# Patient Record
Sex: Female | Born: 1935
Health system: Southern US, Community
[De-identification: ages and names within clinical notes are randomized; demographics above are authoritative.]

## PROBLEM LIST (undated history)

## (undated) DIAGNOSIS — C3490 Malignant neoplasm of unspecified part of unspecified bronchus or lung: Secondary | ICD-10-CM

## (undated) DIAGNOSIS — C259 Malignant neoplasm of pancreas, unspecified: Secondary | ICD-10-CM

## (undated) DIAGNOSIS — J449 Chronic obstructive pulmonary disease, unspecified: Secondary | ICD-10-CM

## (undated) DIAGNOSIS — J700 Acute pulmonary manifestations due to radiation: Secondary | ICD-10-CM

## (undated) HISTORY — PX: TOTAL HIP ARTHROPLASTY: SHX124

## (undated) HISTORY — PX: REPLACEMENT TOTAL KNEE: SUR1224

## (undated) HISTORY — PX: CHOLECYSTECTOMY: SHX55

## (undated) HISTORY — PX: APPENDECTOMY: SHX54

## (undated) HISTORY — PX: OTHER SURGICAL HISTORY: SHX169

---

## 2014-10-17 ENCOUNTER — Encounter (INDEPENDENT_AMBULATORY_CARE_PROVIDER_SITE_OTHER): Payer: 59 | Admitting: Ophthalmology

## 2014-10-17 DIAGNOSIS — H59033 Cystoid macular edema following cataract surgery, bilateral: Secondary | ICD-10-CM

## 2014-10-17 DIAGNOSIS — H3531 Nonexudative age-related macular degeneration: Secondary | ICD-10-CM

## 2014-10-17 DIAGNOSIS — H43813 Vitreous degeneration, bilateral: Secondary | ICD-10-CM

## 2014-11-24 ENCOUNTER — Encounter (HOSPITAL_COMMUNITY): Payer: Self-pay | Admitting: Emergency Medicine

## 2014-11-24 ENCOUNTER — Emergency Department (HOSPITAL_COMMUNITY): Payer: Medicare (Managed Care)

## 2014-11-24 ENCOUNTER — Emergency Department (HOSPITAL_COMMUNITY)
Admission: EM | Admit: 2014-11-24 | Discharge: 2014-11-25 | Disposition: A | Discharge status: Home / Self Care | Payer: Medicare (Managed Care) | Attending: Emergency Medicine | Admitting: Emergency Medicine

## 2014-11-24 DIAGNOSIS — Z7952 Long term (current) use of systemic steroids: Secondary | ICD-10-CM | POA: Diagnosis not present

## 2014-11-24 DIAGNOSIS — R918 Other nonspecific abnormal finding of lung field: Secondary | ICD-10-CM

## 2014-11-24 DIAGNOSIS — R112 Nausea with vomiting, unspecified: Secondary | ICD-10-CM | POA: Diagnosis not present

## 2014-11-24 DIAGNOSIS — E860 Dehydration: Secondary | ICD-10-CM | POA: Diagnosis not present

## 2014-11-24 DIAGNOSIS — R55 Syncope and collapse: Secondary | ICD-10-CM

## 2014-11-24 DIAGNOSIS — R0602 Shortness of breath: Secondary | ICD-10-CM | POA: Insufficient documentation

## 2014-11-24 DIAGNOSIS — Z79899 Other long term (current) drug therapy: Secondary | ICD-10-CM | POA: Insufficient documentation

## 2014-11-24 LAB — BASIC METABOLIC PANEL
ANION GAP: 8 (ref 5–15)
BUN: 8 mg/dL (ref 6–23)
CO2: 23 mmol/L (ref 19–32)
CREATININE: 0.61 mg/dL (ref 0.50–1.10)
Calcium: 9.6 mg/dL (ref 8.4–10.5)
Chloride: 101 mmol/L (ref 96–112)
GFR calc Af Amer: >90 mL/min (ref >90)
GFR, EST NON AFRICAN AMERICAN: 80 mL/min — AB (ref >90)
Glucose, Bld: 110 mg/dL — ABNORMAL HIGH (ref 70–99)
Potassium: 3.7 mmol/L (ref 3.5–5.1)
SODIUM: 132 mmol/L — AB (ref 135–145)

## 2014-11-24 LAB — BRAIN NATRIURETIC PEPTIDE: B Natriuretic Peptide: 78.6 pg/mL (ref 0.0–100.0)

## 2014-11-24 LAB — CBC WITH DIFFERENTIAL/PLATELET
BASOS PCT: 1 % (ref 0–1)
Basophils Absolute: 0 10*3/uL (ref 0.0–0.1)
EOS ABS: 0 10*3/uL (ref 0.0–0.7)
Eosinophils Relative: 1 % (ref 0–5)
HEMATOCRIT: 39.6 % (ref 36.0–46.0)
HEMOGLOBIN: 13.3 g/dL (ref 12.0–15.0)
Lymphocytes Relative: 25 % (ref 12–46)
Lymphs Abs: 1.5 10*3/uL (ref 0.7–4.0)
MCH: 28.4 pg (ref 26.0–34.0)
MCHC: 33.6 g/dL (ref 30.0–36.0)
MCV: 84.4 fL (ref 78.0–100.0)
MONOS PCT: 8 % (ref 3–12)
Monocytes Absolute: 0.5 10*3/uL (ref 0.1–1.0)
NEUTROS ABS: 4.1 10*3/uL (ref 1.7–7.7)
Neutrophils Relative %: 67 % (ref 43–77)
Platelets: 177 10*3/uL (ref 150–400)
RBC: 4.69 MIL/uL (ref 3.87–5.11)
RDW: 14.9 % (ref 11.5–15.5)
WBC: 6.1 10*3/uL (ref 4.0–10.5)

## 2014-11-24 LAB — I-STAT TROPONIN, ED: Troponin i, poc: 0.01 ng/mL (ref 0.00–0.08)

## 2014-11-24 MED ORDER — SODIUM CHLORIDE 0.9 % IV BOLUS (SEPSIS)
1000.0000 mL | Freq: Once | INTRAVENOUS | Status: AC
  Administered 2014-11-24: 1000 mL via INTRAVENOUS

## 2014-11-24 NOTE — ED Provider Notes (Signed)
CSN: [id number]     Arrival date & time 11/24/14  2027 History   First MD Initiated Contact with Patient 11/24/14 2048     Chief Complaint  Patient presents with  . Emesis  . Loss of Consciousness     (Consider location/radiation/quality/duration/timing/severity/associated sxs/prior Treatment) Patient is a 81 y.o. female presenting with syncope. The history is provided by the patient.  Loss of Consciousness Episode history:  Single Most recent episode:  Today Duration:  5 minutes Timing:  Constant Progression:  Resolved Chronicity:  New Witnessed: yes   Relieved by:  None tried Worsened by:  Nothing tried Ineffective treatments:  None tried Associated symptoms: nausea, shortness of breath and vomiting   Associated symptoms: no confusion, no difficulty breathing, no fever and no headaches     History reviewed. No pertinent past medical history. Past Surgical History  Procedure Laterality Date  . Cataracts      surgery   History reviewed. No pertinent family history. History  Substance Use Topics  . Smoking status: Never Smoker   . Smokeless tobacco: Not on file  . Alcohol Use: Not on file   OB History    No data available     Review of Systems  Constitutional: Negative for fever.  Respiratory: Positive for shortness of breath.   Cardiovascular: Positive for syncope.  Gastrointestinal: Positive for nausea and vomiting.  Neurological: Negative for headaches.  Psychiatric/Behavioral: Negative for confusion.  All other systems reviewed and are negative.     Allergies  Review of patient's allergies indicates no known allergies.  Home Medications   Prior to Admission medications   Medication Sig Start Date End Date Taking? Authorizing Provider  Multiple Vitamin (MULTIVITAMIN WITH MINERALS) TABS tablet Take 1 tablet by mouth daily.   Yes Historical Provider, MD  omeprazole (PRILOSEC OTC) 20 MG tablet Take 20 mg by mouth daily.   Yes Historical Provider, MD   prednisoLONE acetate (PRED FORTE) 1 % ophthalmic suspension Place 1 drop into both eyes 4 (four) times daily. 10/17/14  Yes Historical Provider, MD  VITAMIN E PO Take 1 tablet by mouth daily.   Yes Historical Provider, MD   BP 144/63 mmHg  Pulse 59  Temp(Src) 97.9 F (36.6 C) (Oral)  Resp 11  Ht 5\' 4"  (1.626 m)  Wt 190 lb (86.183 kg)  BMI 32.60 kg/m2  SpO2 94% Physical Exam  Constitutional: She is oriented to person, place, and time. She appears well-developed and well-nourished. No distress.  Very well-appearing, 76 elderly female in no distress  HENT:  Head: Normocephalic and atraumatic.  Mouth/Throat: No oropharyngeal exudate.  Mucous membranes dry  Eyes: EOM are normal. Pupils are equal, round, and reactive to light.  Neck: Normal range of motion. Neck supple.  Cardiovascular: Normal rate and regular rhythm.  Exam reveals no gallop and no friction rub.   No murmur heard. Pulmonary/Chest: Effort normal and breath sounds normal. No respiratory distress. She has no wheezes. She has no rales.  Abdominal: Soft. She exhibits no distension. There is no tenderness.  Musculoskeletal: Normal range of motion. She exhibits no edema or tenderness.  Lymphadenopathy:    She has no cervical adenopathy.  Neurological: She is alert and oriented to person, place, and time. She has normal strength. No cranial nerve deficit or sensory deficit. Coordination and gait normal.  Skin: Skin is warm and dry. No rash noted. She is not diaphoretic.  Psychiatric: She has a normal mood and affect. Her behavior is normal. Judgment and thought  content normal.  Nursing note and vitals reviewed.   ED Course  Procedures (including critical care time) Labs Review Labs Reviewed  BASIC METABOLIC PANEL - Abnormal; Notable for the following:    Sodium 132 (*)    Glucose, Bld 110 (*)    GFR calc non Af Amer 80 (*)    All other components within normal limits  CBC WITH DIFFERENTIAL/PLATELET  BRAIN  NATRIURETIC PEPTIDE  I-STAT TROPOININ, ED    Imaging Review Dg Chest 2 View  11/25/2014   CLINICAL DATA:  Altered mental status.  Vomiting.  EXAM: CHEST  2 VIEW  COMPARISON:  None.  FINDINGS: There is a 3.5 cm mass or masslike consolidation in the left lung centrally. The right lung is clear. Generalized interstitial fluid or thickening is present bilaterally. There is a hiatal hernia. No large effusions.  IMPRESSION: *Left lung mass or masslike consolidation. *Hiatal hernia *Recommend chest CT for probable neoplasm   Electronically Signed   By: Andreas Newport M.D.   On: 11/25/2014 00:02   Ct Head Wo Contrast  11/25/2014   CLINICAL DATA:  Syncopal episode well seated.  Tired.  EXAM: CT HEAD WITHOUT CONTRAST  TECHNIQUE: Contiguous axial images were obtained from the base of the skull through the vertex without intravenous contrast.  COMPARISON:  None.  FINDINGS: The ventricles and sulci are normal for age. No intraparenchymal hemorrhage, mass effect nor midline shift. Patchy supratentorial white matter hypodensities are within normal range for patient's age and though non-specific suggest sequelae of chronic small vessel ischemic disease. No acute large vascular territory infarcts.  No abnormal extra-axial fluid collections. Basal cisterns are patent. Mild calcific atherosclerosis of the carotid siphons.  No skull fracture. The included ocular globes and orbital contents are non-suspicious. Bilateral ocular lens implants. The mastoid aircells and included paranasal sinuses are well-aerated. Patient is edentulous. Calcified pannus at the level of C1-2 can be seen with CPPD, partially imaged.  IMPRESSION: No acute intracranial process ; normal noncontrast CT of the head for age.   Electronically Signed   By: Elon Alas   On: 11/25/2014 00:07     EKG Interpretation None      MDM   Final diagnoses:  Syncope and collapse  Lung mass   Remarkably healthy 81 year old female with no medical  problems presents with syncopal episode. Single episode from sitting with mild shaking of the right hand after this happened. She then had a period after the episode where she was less responsive and then vomited. Back to baseline within 5-10 minutes. Shaking episode does not seem consistent with seizure, especially given no previous history. This could be myoclonic jerks related to syncope. Unclear etiology of syncope but it is worrisome that she has no preceding symptoms. Differential includes dehydration as well as hypoglycemia as her only intake today was a banana. She did have some mild nausea and she does appear to be slightly dehydrated on my exam. More concerning is the possibility of cardiac etiology given syncope without preceding events. CT head as well as chest x-ray and laboratory workup will be performed. EKG is normal sinus rhythm without ischemic changes or arrhythmia, interval changes, delta waves. No Brugada. I have discussed need for echocardiogram and possible observation and the patient would refuse that can prefer to have outpatient cardiology follow-up if workup is found to be negative.  12:30 AM laboratory workup reassuring. CT head with no acute intracranial abnormality. I discussed options including observation for echocardiogram and cardiac monitoring and the  patient refuses. We also discussed her lung nodule noted on the left lung. Recommended close outpatient follow-up CT chest for further evaluation. She does have a significant smoking history which is concerning for possible malignancy. Additionally, she will follow up with Dr. Criss Rosales on Monday for an outpatient echocardiogram for syncope. Referral to cardiology also provided.  Larence Penning, MD 11/25/14 0045  Elnora Morrison, MD 11/25/14 [phone]

## 2014-11-24 NOTE — ED Notes (Signed)
Patient transported to X-ray & CT °

## 2014-11-24 NOTE — ED Notes (Addendum)
Per EMS- pt was at home sitting in chair, passed out and family reported convulsion like activity. Pt woke up and had one episode of vomiting. Given 4 mg zofran IV. Pt still reports nausea. No medical history besides cataract surgery. Pt states she normally eats but today only had a banana, states she is hungry though.

## 2014-11-25 NOTE — Discharge Instructions (Signed)
Syncope °Syncope is a medical term for fainting or passing out. This means you lose consciousness and drop to the ground. People are generally unconscious for less than 5 minutes. You may have some muscle twitches for up to 15 seconds before waking up and returning to normal. Syncope occurs more often in older adults, but it can happen to anyone. While most causes of syncope are not dangerous, syncope can be a sign of a serious medical problem. It is important to seek medical care.  °CAUSES  °Syncope is caused by a sudden drop in blood flow to the brain. The specific cause is often not determined. Factors that can bring on syncope include: °· Taking medicines that lower blood pressure. °· Sudden changes in posture, such as standing up quickly. °· Taking more medicine than prescribed. °· Standing in one place for too long. °· Seizure disorders. °· Dehydration and excessive exposure to heat. °· Low blood sugar (hypoglycemia). °· Straining to have a bowel movement. °· Heart disease, irregular heartbeat, or other circulatory problems. °· Fear, emotional distress, seeing blood, or severe pain. °SYMPTOMS  °Right before fainting, you may: °· Feel dizzy or light-headed. °· Feel nauseous. °· See all white or all black in your field of vision. °· Have cold, clammy skin. °DIAGNOSIS  °Your health care provider will ask about your symptoms, perform a physical exam, and perform an electrocardiogram (ECG) to record the electrical activity of your heart. Your health care provider may also perform other heart or blood tests to determine the cause of your syncope which may include: °· Transthoracic echocardiogram (TTE). During echocardiography, sound waves are used to evaluate how blood flows through your heart. °· Transesophageal echocardiogram (TEE). °· Cardiac monitoring. This allows your health care provider to monitor your heart rate and rhythm in real time. °· Holter monitor. This is a portable device that records your  heartbeat and can help diagnose heart arrhythmias. It allows your health care provider to track your heart activity for several days, if needed. °· Stress tests by exercise or by giving medicine that makes the heart beat faster. °TREATMENT  °In most cases, no treatment is needed. Depending on the cause of your syncope, your health care provider may recommend changing or stopping some of your medicines. °HOME CARE INSTRUCTIONS °· Have someone stay with you until you feel stable. °· Do not drive, use machinery, or play sports until your health care provider says it is okay. °· Keep all follow-up appointments as directed by your health care provider. °· Lie down right away if you start feeling like you might faint. Breathe deeply and steadily. Wait until all the symptoms have passed. °· Drink enough fluids to keep your urine clear or pale yellow. °· If you are taking blood pressure or heart medicine, get up slowly and take several minutes to sit and then stand. This can reduce dizziness. °SEEK IMMEDIATE MEDICAL CARE IF:  °· You have a severe headache. °· You have unusual pain in the chest, abdomen, or back. °· You are bleeding from your mouth or rectum, or you have black or tarry stool. °· You have an irregular or very fast heartbeat. °· You have pain with breathing. °· You have repeated fainting or seizure-like jerking during an episode. °· You faint when sitting or lying down. °· You have confusion. °· You have trouble walking. °· You have severe weakness. °· You have vision problems. °If you fainted, call your local emergency services (911 in [place] Do not drive   yourself to the hospital.  °MAKE SURE YOU: °· Understand these instructions. °· Will watch your condition. °· Will get help right away if you are not doing well or get worse. °Document Released: 08/10/2005 Document Revised: 08/15/2013 Document Reviewed: 10/09/2011 °Exit[employer]® Patient Information ©2015 [employer]. This information is not intended to replace  advice given to you by your health care provider. Make sure you discuss any questions you have with your health care provider. ° °

## 2014-11-28 ENCOUNTER — Other Ambulatory Visit: Payer: Self-pay | Admitting: Family Medicine

## 2014-11-28 ENCOUNTER — Ambulatory Visit (INDEPENDENT_AMBULATORY_CARE_PROVIDER_SITE_OTHER): Payer: 59 | Admitting: Ophthalmology

## 2014-11-28 DIAGNOSIS — H43813 Vitreous degeneration, bilateral: Secondary | ICD-10-CM | POA: Diagnosis not present

## 2014-11-28 DIAGNOSIS — H59033 Cystoid macular edema following cataract surgery, bilateral: Secondary | ICD-10-CM

## 2014-11-28 DIAGNOSIS — R918 Other nonspecific abnormal finding of lung field: Secondary | ICD-10-CM

## 2014-11-28 DIAGNOSIS — H3531 Nonexudative age-related macular degeneration: Secondary | ICD-10-CM | POA: Diagnosis not present

## 2014-11-30 ENCOUNTER — Ambulatory Visit
Admission: RE | Admit: 2014-11-30 | Discharge: 2014-11-30 | Disposition: A | Discharge status: Home / Self Care | Payer: 59 | Source: Ambulatory Visit | Attending: Family Medicine | Admitting: Family Medicine

## 2014-11-30 DIAGNOSIS — R918 Other nonspecific abnormal finding of lung field: Secondary | ICD-10-CM

## 2014-11-30 MED ORDER — IOPAMIDOL (ISOVUE-300) INJECTION 61%
75.0000 mL | Freq: Once | INTRAVENOUS | Status: AC | PRN
  Administered 2014-11-30: 75 mL via INTRAVENOUS

## 2014-12-03 ENCOUNTER — Telehealth: Payer: Self-pay | Admitting: Internal Medicine

## 2014-12-03 NOTE — Telephone Encounter (Signed)
Spoke with patient's son and advised to have the lung biopsy and reschedule the appointment with Dr Lovena Le.  He thought she was having an echo here and not an appointment.  I let him know I would call the PCP where she was referred and they will have to rescheduled appointment with lung MD or refer to another MD as we have never seen her I can not refer to another place.  He verbalized understanding and is going to keep the appointment as scheduled for now

## 2014-12-03 NOTE — Telephone Encounter (Signed)
New Message       Pt's son calling wanting to know if we have anyone in our office that could do a lung biopsy. Pt just found out from her PCP that she has finally been scheduled for her lung biopsy but it is on the same day as the pt's appt w/ Dr. Lovena Le. Pt's son was advised that I don't believe we do that procedure but he would like a call back from Dr. Tanna Furry nurse in regards to his question. Please call back and advise.

## 2014-12-05 ENCOUNTER — Encounter: Payer: Self-pay | Admitting: Emergency Medicine

## 2014-12-06 ENCOUNTER — Encounter: Payer: Self-pay | Admitting: Emergency Medicine

## 2014-12-06 ENCOUNTER — Ambulatory Visit (INDEPENDENT_AMBULATORY_CARE_PROVIDER_SITE_OTHER): Payer: 59 | Admitting: Emergency Medicine

## 2014-12-06 VITALS — BP 116/84 | HR 65 | Ht 61.0 in | Wt 185.0 lb

## 2014-12-06 DIAGNOSIS — R918 Other nonspecific abnormal finding of lung field: Secondary | ICD-10-CM

## 2014-12-06 DIAGNOSIS — R0609 Other forms of dyspnea: Secondary | ICD-10-CM

## 2014-12-06 DIAGNOSIS — J449 Chronic obstructive pulmonary disease, unspecified: Secondary | ICD-10-CM | POA: Insufficient documentation

## 2014-12-06 DIAGNOSIS — J984 Other disorders of lung: Secondary | ICD-10-CM

## 2014-12-06 NOTE — Assessment & Plan Note (Signed)
The size and appearance are consistent with a primary lung cancer. I think it would be reasonable to pursue biopsy in this fairly healthy patient despite her age. I think in order to ensure the highest yield and safest biopsy we should perform a PET scan to see if there is any distant disease. If there is no distant disease then we need to consider either bronchoscopy under conscious sedation or ENB under general anesthesia. Transthoracic needle biopsy is also an option but they would have to pass through some emphysematous lung reach the lesion. We will need to perform a cardiology evaluation given her recent syncope to ensure that she can tolerate the procedure and sedation.  Her insurance may dictate that this referral is out of network. We will need to sort out an exception or ensure that she can get care at [facility]

## 2014-12-06 NOTE — Patient Instructions (Addendum)
There is a lesion in your left lung that looks suspicious for a possible early lung cancer Given your good functional status and only few medical problems I believe that is reasonable to attempt to get a biopsy of this lesion to help guide you decisions about future treatment We will perform a PET scan to better evaluate the nodule Before we plan to do a biopsy I would like for you to have a cardiology evaluation to ensure that sedation would be safe and to evaluate your fainting spell. We will try to arrange this as soon as possible We will start a new medication called Spiriva. Please take 2 puffs once a day Follow with Dr Lamonte Sakai in 1 month

## 2014-12-06 NOTE — Progress Notes (Signed)
Subjective:    [name], female    DOB: Nov 23, 1926, 81 y.o.   MRN: [id number]  HPI 81 yo former smoker (about 50-60 pk-yrs), otherwise very little past medical history. She had an episode of syncope (the 2nd time in 10 yrs) that sent her to the ED. Part of the eval included a CT chest, showed . She feels well. Has not lost wt, has no cough.   Her insurance may dictate that this referral is out of network. We will need to sort out an exception or ensure that she can get care at [place] scan chest 11/30/14 -- 3.0 x 3.2 cm mass, compression fractures.    Review of Systems  Constitutional: Negative for fever and unexpected weight change.  HENT: Negative for congestion, dental problem, ear pain, nosebleeds, postnasal drip, rhinorrhea, sinus pressure, sneezing, sore throat and trouble swallowing.   Eyes: Negative for redness and itching.  Respiratory: Positive for cough and shortness of breath. Negative for chest tightness and wheezing.   Cardiovascular: Negative for palpitations and leg swelling.  Gastrointestinal: Negative for nausea and vomiting.  Genitourinary: Negative for dysuria.  Musculoskeletal: Negative for joint swelling.  Skin: Negative for rash.  Neurological: Negative for headaches.  Hematological: Does not bruise/bleed easily.  Psychiatric/Behavioral: Negative for dysphoric mood. The patient is not nervous/anxious.        Objective:   Physical Exam Filed Vitals:   12/06/14 1356 12/06/14 1357  BP:  116/84  Pulse:  65  Height: 5\' 1"  (1.549 m)   Weight: 185 lb (83.915 kg)   SpO2:  97%   Gen: Pleasant, elderly kyphotic, in no distress,  normal affect  ENT: No lesions,  mouth clear,  oropharynx clear, no postnasal drip  Neck: No JVD, no TMG, no carotid bruits  Lungs: No use of accessory muscles, clear without rales or rhonchi  Cardiovascular: RRR, heart sounds normal, no murmur or gallops, no peripheral edema  Musculoskeletal: No deformities,  no cyanosis or clubbing  Neuro: alert, non focal  Skin: Warm, no lesions or rashes    No past medical history on file.   Family History  Problem Relation Age of Onset  . Dementia Mother   . Hypertension Mother   . Deep vein thrombosis Father      History   Social History  . Marital Status: Widowed    Spouse Name: N/A  . Number of Children: N/A  . Years of Education: N/A   Occupational History  . Not on file.   Social History Main Topics  . Smoking status: Former Smoker    Types: Cigarettes    Quit date: 08/24/2012  . Smokeless tobacco: Not on file  . Alcohol Use: Yes  . Drug Use: No  . Sexual Activity: Not on file   Other Topics Concern  . Not on file   Social History Narrative     No Known Allergies   Outpatient Prescriptions Prior to Visit  Medication Sig Dispense Refill  . Multiple Vitamin (MULTIVITAMIN WITH MINERALS) TABS tablet Take 1 tablet by mouth daily.    . Multiple Vitamins-Minerals (OPTIVITE PO) Take 1 capsule by mouth daily.    Marland Kitchen omeprazole (PRILOSEC OTC) 20 MG tablet Take 20 mg by mouth daily.    . prednisoLONE acetate (PRED FORTE) 1 % ophthalmic suspension Place 2 drops into both eyes 4 (four) times daily.   12  . vitamin E 400 UNIT capsule Take 400 Units by mouth daily.  No facility-administered medications prior to visit.         Assessment & Plan:  Dyspnea on exertion She has emphysematous changes on her CT scan of the chest. With her tobacco history is suspect that she does have some degree of COPD to explain her progressive dyspnea. We will need to perform pulmonary function testing at some point in the future but for now I would like to start empiric Spiriva to see if she will benefit   Mass of lower lobe of left lung The size and appearance are consistent with a primary lung cancer. I think it would be reasonable to pursue biopsy in this fairly healthy patient despite her age. I think in order to ensure the highest yield and  safest biopsy we should perform a PET scan to see if there is any distant disease. If there is no distant disease then we need to consider either bronchoscopy under conscious sedation or ENB under general anesthesia. Transthoracic needle biopsy is also an option but they would have to pass through some emphysematous lung reach the lesion. We will need to perform a cardiology evaluation given her recent syncope to ensure that she can tolerate the procedure and sedation.  Her insurance may dictate that this referral is out of network. We will need to sort out an exception or ensure that she can get care at [facility]

## 2014-12-06 NOTE — Assessment & Plan Note (Signed)
She has emphysematous changes on her CT scan of the chest. With her tobacco history is suspect that she does have some degree of COPD to explain her progressive dyspnea. We will need to perform pulmonary function testing at some point in the future but for now I would like to start empiric Spiriva to see if she will benefit

## 2014-12-10 ENCOUNTER — Ambulatory Visit: Payer: Medicare (Managed Care) | Admitting: Internal Medicine

## 2015-01-07 ENCOUNTER — Ambulatory Visit: Payer: 59 | Admitting: Emergency Medicine

## 2015-01-09 ENCOUNTER — Encounter (INDEPENDENT_AMBULATORY_CARE_PROVIDER_SITE_OTHER): Payer: 59 | Admitting: Ophthalmology

## 2015-01-09 DIAGNOSIS — H59033 Cystoid macular edema following cataract surgery, bilateral: Secondary | ICD-10-CM

## 2015-01-09 DIAGNOSIS — H43813 Vitreous degeneration, bilateral: Secondary | ICD-10-CM

## 2015-01-09 DIAGNOSIS — H3531 Nonexudative age-related macular degeneration: Secondary | ICD-10-CM | POA: Diagnosis not present

## 2015-03-06 ENCOUNTER — Encounter (INDEPENDENT_AMBULATORY_CARE_PROVIDER_SITE_OTHER): Payer: 59 | Admitting: Ophthalmology

## 2015-03-06 DIAGNOSIS — H43813 Vitreous degeneration, bilateral: Secondary | ICD-10-CM

## 2015-03-06 DIAGNOSIS — H3531 Nonexudative age-related macular degeneration: Secondary | ICD-10-CM

## 2015-03-06 DIAGNOSIS — H59031 Cystoid macular edema following cataract surgery, right eye: Secondary | ICD-10-CM | POA: Diagnosis not present

## 2015-04-17 ENCOUNTER — Encounter (INDEPENDENT_AMBULATORY_CARE_PROVIDER_SITE_OTHER): Payer: 59 | Admitting: Ophthalmology

## 2015-04-22 ENCOUNTER — Encounter (INDEPENDENT_AMBULATORY_CARE_PROVIDER_SITE_OTHER): Payer: 59 | Admitting: Ophthalmology

## 2015-04-22 DIAGNOSIS — H43813 Vitreous degeneration, bilateral: Secondary | ICD-10-CM | POA: Diagnosis not present

## 2015-04-22 DIAGNOSIS — H59031 Cystoid macular edema following cataract surgery, right eye: Secondary | ICD-10-CM | POA: Diagnosis not present

## 2015-04-22 DIAGNOSIS — H3531 Nonexudative age-related macular degeneration: Secondary | ICD-10-CM

## 2015-06-24 ENCOUNTER — Encounter (INDEPENDENT_AMBULATORY_CARE_PROVIDER_SITE_OTHER): Payer: 59 | Admitting: Ophthalmology

## 2015-06-24 DIAGNOSIS — H59031 Cystoid macular edema following cataract surgery, right eye: Secondary | ICD-10-CM

## 2015-06-24 DIAGNOSIS — H353134 Nonexudative age-related macular degeneration, bilateral, advanced atrophic with subfoveal involvement: Secondary | ICD-10-CM | POA: Diagnosis not present

## 2015-06-24 DIAGNOSIS — H43813 Vitreous degeneration, bilateral: Secondary | ICD-10-CM

## 2015-07-01 ENCOUNTER — Encounter (INDEPENDENT_AMBULATORY_CARE_PROVIDER_SITE_OTHER): Payer: 59 | Admitting: Ophthalmology

## 2015-09-10 DIAGNOSIS — I779 Disorder of arteries and arterioles, unspecified: Secondary | ICD-10-CM | POA: Diagnosis not present

## 2015-09-10 DIAGNOSIS — R918 Other nonspecific abnormal finding of lung field: Secondary | ICD-10-CM | POA: Diagnosis not present

## 2015-09-10 DIAGNOSIS — M4854XA Collapsed vertebra, not elsewhere classified, thoracic region, initial encounter for fracture: Secondary | ICD-10-CM | POA: Diagnosis not present

## 2015-09-10 DIAGNOSIS — E559 Vitamin D deficiency, unspecified: Secondary | ICD-10-CM | POA: Diagnosis not present

## 2015-09-10 DIAGNOSIS — Z136 Encounter for screening for cardiovascular disorders: Secondary | ICD-10-CM | POA: Diagnosis not present

## 2015-09-10 DIAGNOSIS — G4700 Insomnia, unspecified: Secondary | ICD-10-CM | POA: Diagnosis not present

## 2015-09-10 DIAGNOSIS — J439 Emphysema, unspecified: Secondary | ICD-10-CM | POA: Diagnosis not present

## 2015-09-10 DIAGNOSIS — Z6835 Body mass index (BMI) 35.0-35.9, adult: Secondary | ICD-10-CM | POA: Diagnosis not present

## 2015-09-10 DIAGNOSIS — C3490 Malignant neoplasm of unspecified part of unspecified bronchus or lung: Secondary | ICD-10-CM | POA: Diagnosis not present

## 2015-09-26 ENCOUNTER — Telehealth: Payer: Self-pay | Admitting: *Deleted

## 2015-09-26 DIAGNOSIS — R918 Other nonspecific abnormal finding of lung field: Secondary | ICD-10-CM

## 2015-09-26 NOTE — Telephone Encounter (Signed)
Oncology Nurse Navigator Documentation  Oncology Nurse Navigator Flowsheets 09/26/2015  Navigator Location [facility]  Navigator Encounter Type Telephone;Introductory phone call/I received referral on Ms. Duggan last week.  I contacted referring physician to see if he was going to get tissue or would he like thoracic team to.  I did not hear from him.  I called patient today and scheduled her to be see at [facility] on 10/10/15 arrive at 12:30.  She verbalized understanding of appt time and place.   Treatment Phase Abnormal Scans  Barriers/Navigation Needs Coordination of Care  Interventions Coordination of Care  Coordination of Care Appts  Acuity Level 1  Time Spent with Patient 15

## 2015-10-07 ENCOUNTER — Telehealth: Payer: Self-pay | Admitting: *Deleted

## 2015-10-07 NOTE — Telephone Encounter (Signed)
Oncology Nurse Navigator Documentation  Oncology Nurse Navigator Flowsheets 10/07/2015  Treatment Phase Abnormal Scans/I noticed patient has been tx for lung cancer at [facility].  I called to clarify referral.  She stated she wants to be seen here at [facility].  I asked that she obtain images from [facility].  She stated she would.   Barriers/Navigation Needs Coordination of Care  Interventions Coordination of Care  Coordination of Care Appts  Acuity Level 1  Time Spent with Patient 15

## 2015-10-08 ENCOUNTER — Telehealth: Payer: Self-pay | Admitting: *Deleted

## 2015-10-08 DIAGNOSIS — Z961 Presence of intraocular lens: Secondary | ICD-10-CM | POA: Diagnosis not present

## 2015-10-08 DIAGNOSIS — H353131 Nonexudative age-related macular degeneration, bilateral, early dry stage: Secondary | ICD-10-CM | POA: Diagnosis not present

## 2015-10-08 DIAGNOSIS — H26493 Other secondary cataract, bilateral: Secondary | ICD-10-CM | POA: Diagnosis not present

## 2015-10-08 NOTE — Telephone Encounter (Signed)
Oncology Nurse Navigator Documentation  Oncology Nurse Navigator Flowsheets 10/08/2015  Navigator Location -  Navigator Encounter Type Telephone/I called Carol Cook to see if she was able to reach [facility] Radiology dept to request scans.  She stated she was unable.  I instructed her on next steps.  She verbalized understanding.  She stated she would call when she had the scans. I gave her my name and phone number to call.   Telephone Outgoing Call  Treatment Phase Abnormal Scans  Barriers/Navigation Needs Coordination of Care  Interventions Coordination of Care  Coordination of Care Appts  Acuity Level 1  Time Spent with Patient 15

## 2015-10-10 ENCOUNTER — Ambulatory Visit: Payer: 59 | Admitting: Internal Medicine

## 2015-10-10 ENCOUNTER — Telehealth: Payer: Self-pay | Admitting: *Deleted

## 2015-10-10 DIAGNOSIS — R918 Other nonspecific abnormal finding of lung field: Secondary | ICD-10-CM

## 2015-10-10 NOTE — Telephone Encounter (Signed)
Oncology Nurse Navigator Documentation  Oncology Nurse Navigator Flowsheets 10/10/2015  Navigator Location -  Navigator Encounter Type Telephone/Ms. Duross called me today.  She has received her CD's from [facility].  I called her back and gave her an appt to see Dr. Julien Nordmann on 10/23/15 arrive at 1:45.  She verbalized understanding of appt time and place  Telephone Outgoing Call  Treatment Phase Abnormal Scans  Barriers/Navigation Needs Coordination of Care  Interventions Coordination of Care  Coordination of Care Appts  Acuity Level 1  Time Spent with Patient 15

## 2015-10-23 ENCOUNTER — Ambulatory Visit (HOSPITAL_BASED_OUTPATIENT_CLINIC_OR_DEPARTMENT_OTHER): Payer: PPO | Admitting: Internal Medicine

## 2015-10-23 ENCOUNTER — Other Ambulatory Visit (HOSPITAL_BASED_OUTPATIENT_CLINIC_OR_DEPARTMENT_OTHER): Payer: PPO

## 2015-10-23 ENCOUNTER — Encounter: Payer: Self-pay | Admitting: Internal Medicine

## 2015-10-23 ENCOUNTER — Telehealth: Payer: Self-pay | Admitting: Internal Medicine

## 2015-10-23 VITALS — BP 132/81 | HR 84 | Temp 98.2°F | Resp 18 | Ht 61.0 in | Wt 190.9 lb

## 2015-10-23 DIAGNOSIS — R5383 Other fatigue: Secondary | ICD-10-CM

## 2015-10-23 DIAGNOSIS — C3432 Malignant neoplasm of lower lobe, left bronchus or lung: Secondary | ICD-10-CM

## 2015-10-23 DIAGNOSIS — R918 Other nonspecific abnormal finding of lung field: Secondary | ICD-10-CM

## 2015-10-23 DIAGNOSIS — C3492 Malignant neoplasm of unspecified part of left bronchus or lung: Secondary | ICD-10-CM | POA: Insufficient documentation

## 2015-10-23 LAB — CBC WITH DIFFERENTIAL/PLATELET
BASO%: 1.3 % (ref 0.0–2.0)
Basophils Absolute: 0.1 10*3/uL (ref 0.0–0.1)
EOS%: 3 % (ref 0.0–7.0)
Eosinophils Absolute: 0.2 10*3/uL (ref 0.0–0.5)
HCT: 39.8 % (ref 34.8–46.6)
HGB: 13.1 g/dL (ref 11.6–15.9)
LYMPH%: 14.9 % (ref 14.0–49.7)
MCH: 28.2 pg (ref 25.1–34.0)
MCHC: 32.8 g/dL (ref 31.5–36.0)
MCV: 86 fL (ref 79.5–101.0)
MONO#: 0.5 10*3/uL (ref 0.1–0.9)
MONO%: 9.1 % (ref 0.0–14.0)
NEUT#: 4 10*3/uL (ref 1.5–6.5)
NEUT%: 71.7 % (ref 38.4–76.8)
PLATELETS: 210 10*3/uL (ref 145–400)
RBC: 4.64 10*6/uL (ref 3.70–5.45)
RDW: 13.9 % (ref 11.2–14.5)
WBC: 5.6 10*3/uL (ref 3.9–10.3)
lymph#: 0.8 10*3/uL — ABNORMAL LOW (ref 0.9–3.3)

## 2015-10-23 LAB — COMPREHENSIVE METABOLIC PANEL
ALT: 10 U/L (ref 0–55)
AST: 22 U/L (ref 5–34)
Albumin: 3.6 g/dL (ref 3.5–5.0)
Alkaline Phosphatase: 83 U/L (ref 40–150)
Anion Gap: 10 mEq/L (ref 3–11)
BILIRUBIN TOTAL: 0.63 mg/dL (ref 0.20–1.20)
BUN: 11.9 mg/dL (ref 7.0–26.0)
CALCIUM: 10.3 mg/dL (ref 8.4–10.4)
CO2: 25 mEq/L (ref 22–29)
Chloride: 104 mEq/L (ref 98–109)
Creatinine: 0.8 mg/dL (ref 0.6–1.1)
EGFR: 68 mL/min/{1.73_m2} — ABNORMAL LOW (ref >90)
Glucose: 105 mg/dl (ref 70–140)
POTASSIUM: 4.4 meq/L (ref 3.5–5.1)
Sodium: 139 mEq/L (ref 136–145)
Total Protein: 7.2 g/dL (ref 6.4–8.3)

## 2015-10-23 NOTE — Progress Notes (Signed)
[place] [phone]   [phone]  CONSULT NOTE  REFERRING PHYSICIAN: Dr. Baltazar Apo  REASON FOR CONSULTATION:  82 years old white female diagnosed with lung cancer.  HPI Carol Cook is a 82 y.o. female with long history of smoking and past medical history significant only for cataract and macular degeneration. The patient was also recently diagnosed with non-small cell lung cancer. Her history for the diagnosis of lung cancer started on 11/24/2014 when the patient presented to the emergency Department complaining of syncopal episode. During her evaluation she had CT scan of the head on 11/25/2014 and it showed no acute intracranial process. Chest x-ray performed on the same day showed left lung mass. This was followed by CT scan of the chest with contrast on 11/30/2014 and it showed 3.2 x 3.0 cm lobulated mass is noted in the superior segment of the left lower lobe which is adjacent to the major fissure. This is highly concerning for malignancy. No mediastinal mass or adenopathy is noted. She was seen by Dr. Lamonte Sakai and he consider the patient for biopsy of the left lower lobe lung mass. She was out of the network for her insurance and her workup was performed at [facility]. She had a PET scan performed on 01/18/2015 and it showed hypermetabolic 3.0 cm left upper lobe lung mass with SUV of 5.6. There was no regional lymphadenopathy or distant metastasis. On 02/15/2015 the patient underwent CT-guided core biopsy of the left lower lobe lung mass by interventional radiology at [facility] and the final pathology was consistent with primary lung adenocarcinoma. The molecular studies showed negative EGFR mutation, ALK gene translocation and ROS 1.  On 05/01/2015 2 patient underwent stereotactic radiotherapy to the superior segment left lower lobe lung mass by radiation oncology at [facility] for a total dose of 50 gray in 5  fractions. Repeat CT scan of the chest on 06/10/2015 showed similar size of the left lower lobe lung mass with adjacent radiation changes. The patient has a lot of transportation issues going to [facility]. She requested her care to be transferred to [facility] and she is here today for evaluation and to establish care with me. When seen today she is feeling fine except for fatigue. She has some shortness breath with exertion that has been increasing over the last months. She also has cough episodes but no significant hemoptysis. She denied having any chest pain. She denied having any significant weight loss or night sweats. She has no nausea or vomiting. She has no headache or visual changes. Family history significant for mother died from on Zymar at age 69. Father died from pulmonary embolism at age 66. The patient is divorced and has 3 children. She was accompanied by her son Carol Cook today. She was to work as a Company secretary. She has a history of smoking less than one pack per day for around 64 years. She quit 3 years ago. She drinks alcohol daily. She has no history of drug abuse.  HPI  History reviewed. No pertinent past medical history.  Past Surgical History  Procedure Laterality Date  . Cataracts      surgery  . Appendectomy    . Cholecystectomy    . Replacement total knee Left   . Total hip arthroplasty Right     Family History  Problem Relation Age of Onset  . Dementia Mother   . Hypertension Mother   . Deep vein  thrombosis Father     Social History Social History  Substance Use Topics  . Smoking status: Former Smoker    Types: Cigarettes    Quit date: 08/24/2012  . Smokeless tobacco: None  . Alcohol Use: Yes    No Known Allergies  Current Outpatient Prescriptions  Medication Sig Dispense Refill  . cholecalciferol (VITAMIN D) 1000 units tablet Take 1,000 Units by mouth daily.    . Multiple Vitamin (MULTIVITAMIN WITH MINERALS) TABS tablet Take  1 tablet by mouth daily.    . Multiple Vitamins-Minerals (OPTIVITE PO) Take 1 capsule by mouth daily.    Marland Kitchen omeprazole (PRILOSEC OTC) 20 MG tablet Take 20 mg by mouth daily.    Marland Kitchen PROAIR HFA 108 (90 Base) MCG/ACT inhaler Inhale 1 puff into the lungs every 4 (four) hours as needed.  3  . traZODone (DESYREL) 50 MG tablet Take 50 mg by mouth daily.    . vitamin C (ASCORBIC ACID) 500 MG tablet Take 500 mg by mouth daily.    . vitamin E 400 UNIT capsule Take 400 Units by mouth daily.     No current facility-administered medications for this visit.    Review of Systems  Constitutional: positive for fatigue Eyes: negative Ears, nose, mouth, throat, and face: negative Respiratory: negative Cardiovascular: negative Gastrointestinal: negative Genitourinary:negative Integument/breast: negative Hematologic/lymphatic: negative Musculoskeletal:negative Neurological: negative Behavioral/Psych: negative Endocrine: negative Allergic/Immunologic: negative  Physical Exam  XHB:ZJIRC, healthy, no distress, well nourished and well developed SKIN: skin color, texture, turgor are normal, no rashes or significant lesions HEAD: Normocephalic, No masses, lesions, tenderness or abnormalities EYES: normal, PERRLA EARS: External ears normal, Canals clear OROPHARYNX:no exudate, no erythema and lips, buccal mucosa, and tongue normal  NECK: supple, no adenopathy, no JVD LYMPH:  no palpable lymphadenopathy, no hepatosplenomegaly BREAST:not examined LUNGS: clear to auscultation , and palpation HEART: regular rate & rhythm and no murmurs ABDOMEN:abdomen soft, non-tender, obese, normal bowel sounds and no masses or organomegaly BACK: Back symmetric, no curvature., No CVA tenderness EXTREMITIES:no joint deformities, effusion, or inflammation, no edema  NEURO: alert & oriented x 3 with fluent speech, no focal motor/sensory deficits  PERFORMANCE STATUS: ECOG 1-2  LABORATORY DATA: Lab Results  Component Value  Date   WBC 5.6 10/23/2015   HGB 13.1 10/23/2015   HCT 39.8 10/23/2015   MCV 86.0 10/23/2015   PLT 210 10/23/2015      Chemistry      Component Value Date/Time   NA 139 10/23/2015 1354   NA 132* 11/24/2014 2057   K 4.4 10/23/2015 1354   K 3.7 11/24/2014 2057   CL 101 11/24/2014 2057   CO2 25 10/23/2015 1354   CO2 23 11/24/2014 2057   BUN 11.9 10/23/2015 1354   BUN 8 11/24/2014 2057   CREATININE 0.8 10/23/2015 1354   CREATININE 0.61 11/24/2014 2057      Component Value Date/Time   CALCIUM 10.3 10/23/2015 1354   CALCIUM 9.6 11/24/2014 2057   ALKPHOS 83 10/23/2015 1354   AST 22 10/23/2015 1354   ALT 10 10/23/2015 1354   BILITOT 0.63 10/23/2015 1354       RADIOGRAPHIC STUDIES: No results found.  ASSESSMENT:This is a very pleasant 82 years old white female with history of stage IB (T2a, N0, M0) non-small cell lung cancer, adenocarcinoma with negative EGFR mutation, ALK gene translocation and ROS1 diagnosed in June 2016. She is status post stereotactic radiotherapy to the left lower lobe superior segment lung mass at [place] on 05/01/2015.  PLAN: I had a lengthy discussion with the patient and her son today about her current disease stage, prognosis and treatment options. Her last imaging studies were performed on 06/10/2015. The patient has increasing shortness of breath and cough recently. I recommended for her to have repeat CT scan of the chest in the next few days for restaging of her disease. If the scan showed no evidence for disease recurrence or progression, I would see her back for follow-up visit in 6 months with repeat CT scan of the chest. For the persistent shortness of breath, I recommended for the patient to reschedule another appointment was Dr. Lamonte Sakai for evaluation of her condition and management of her COPD. The patient and her son agreed to the current plan. The patient voices understanding of current disease status and treatment options and  is in agreement with the current care plan.  All questions were answered. The patient knows to call the clinic with any problems, questions or concerns. We can certainly see the patient much sooner if necessary.  Thank you so much for allowing me to participate in the care of [facility]. I will continue to follow up the patient with you and assist in her care.  I spent 40 minutes counseling the patient face to face. The total time spent in the appointment was 60 minutes.  Disclaimer: This note was dictated with voice recognition software. Similar sounding words can inadvertently be transcribed and may not be corrected upon review.   Jimmie Dattilio K. October 23, 2015, 3:02 PM

## 2015-10-23 NOTE — Telephone Encounter (Signed)
per pof to sch pt appt-gave pt copy of avs °

## 2015-10-25 ENCOUNTER — Inpatient Hospital Stay
Admission: RE | Admit: 2015-10-25 | Discharge: 2015-10-25 | Disposition: A | Discharge status: Home / Self Care | Payer: Self-pay | Source: Ambulatory Visit | Attending: Internal Medicine | Admitting: Internal Medicine

## 2015-10-25 ENCOUNTER — Other Ambulatory Visit: Payer: Self-pay | Admitting: Internal Medicine

## 2015-10-25 ENCOUNTER — Ambulatory Visit (HOSPITAL_COMMUNITY)
Admission: RE | Admit: 2015-10-25 | Discharge: 2015-10-25 | Disposition: A | Discharge status: Home / Self Care | Payer: PPO | Source: Ambulatory Visit | Attending: Internal Medicine | Admitting: Internal Medicine

## 2015-10-25 ENCOUNTER — Encounter (HOSPITAL_COMMUNITY): Payer: Self-pay

## 2015-10-25 DIAGNOSIS — K449 Diaphragmatic hernia without obstruction or gangrene: Secondary | ICD-10-CM | POA: Insufficient documentation

## 2015-10-25 DIAGNOSIS — I700 Atherosclerosis of aorta: Secondary | ICD-10-CM | POA: Insufficient documentation

## 2015-10-25 DIAGNOSIS — C3490 Malignant neoplasm of unspecified part of unspecified bronchus or lung: Secondary | ICD-10-CM | POA: Diagnosis not present

## 2015-10-25 DIAGNOSIS — C3492 Malignant neoplasm of unspecified part of left bronchus or lung: Secondary | ICD-10-CM

## 2015-10-25 DIAGNOSIS — J439 Emphysema, unspecified: Secondary | ICD-10-CM | POA: Diagnosis not present

## 2015-10-25 MED ORDER — IOHEXOL 300 MG/ML  SOLN
100.0000 mL | Freq: Once | INTRAMUSCULAR | Status: AC | PRN
  Administered 2015-10-25: 75 mL via INTRAVENOUS

## 2015-10-28 ENCOUNTER — Telehealth: Payer: Self-pay

## 2015-10-28 NOTE — Telephone Encounter (Signed)
Patient called per Dr. Julien Nordmann looking for CT scan results from Friday 10/25/15.  She is requesting a call back.

## 2015-10-28 NOTE — Telephone Encounter (Signed)
  Returned call to pt, unable to reach pt. Will review with MD and call pt with additional information.

## 2015-10-29 ENCOUNTER — Encounter: Payer: Self-pay | Admitting: Emergency Medicine

## 2015-10-29 ENCOUNTER — Ambulatory Visit (INDEPENDENT_AMBULATORY_CARE_PROVIDER_SITE_OTHER): Payer: PPO | Admitting: Emergency Medicine

## 2015-10-29 VITALS — BP 134/92 | HR 74 | Ht 61.0 in | Wt 189.0 lb

## 2015-10-29 DIAGNOSIS — R0600 Dyspnea, unspecified: Secondary | ICD-10-CM | POA: Diagnosis not present

## 2015-10-29 DIAGNOSIS — C3492 Malignant neoplasm of unspecified part of left bronchus or lung: Secondary | ICD-10-CM

## 2015-10-29 DIAGNOSIS — J449 Chronic obstructive pulmonary disease, unspecified: Secondary | ICD-10-CM

## 2015-10-29 DIAGNOSIS — J700 Acute pulmonary manifestations due to radiation: Secondary | ICD-10-CM | POA: Diagnosis not present

## 2015-10-29 MED ORDER — TIOTROPIUM BROMIDE-OLODATEROL 2.5-2.5 MCG/ACT IN AERS: 2.0000 | INHALATION_SPRAY | Freq: Every day | RESPIRATORY_TRACT | Status: DC

## 2015-10-29 NOTE — Assessment & Plan Note (Signed)
Now being followed by Dr. Julien Nordmann at the [facility]

## 2015-10-29 NOTE — Patient Instructions (Signed)
Please start Stiolto once a day. Rinse your mouth and gargle after using.  Let us know if you develop cough, dry mouth or any other problems from the medication.  Follow with Dr Lamonte Sakai next available with full pulmonary function testing.

## 2015-10-29 NOTE — Progress Notes (Signed)
Subjective:    [name], female    DOB: April 02, 1927, 82 y.o.   MRN: [id number]  HPI 82 yo former smoker (about 50-60 pk-yrs), otherwise very little past medical history. She had an episode of syncope (the 2nd time in 10 yrs) that sent her to the ED. Part of the eval included a CT chest, showed a mass in superior segment LLL. She feels well. Has not lost wt, has no cough.   Her insurance may dictate that this referral is out of network. We will need to sort out an exception or ensure that she can get care at [place] scan chest 11/30/14 -- 3.0 x 3.2 cm mass, compression fractures.   ROV 10/29/15 -- follow-up visit for this pleasant 82 year old woman with a history of tobacco use. I saw her first in April 2016 when she was noted to have a 3 cm mass in the superior segment of her left lower lobe. She was out of network and her subsequent evaluation happened at [facility]. She underwent a CT-guided needle biopsy that showed adenocarcinoma. She underwent stereotactic radiotherapy in 5 fractions. Her insurance is now changed and she will be able to get her care in [place]. She has met Dr. Julien Nordmann with thoracic oncology and will be followed here as well. A CT scan of her chest was performed on 10/25/15 that showed an interval decrease in the size of her left lower lobe mass without any evidence of recurrence. There is some adjacent interstitial disease that's probably related to evolving radiation change. She has noticed some increased exertional SOB, has evolved over about last 2 months.    Review of Systems  Constitutional: Negative for fever and unexpected weight change.  HENT: Negative for congestion, dental problem, ear pain, nosebleeds, postnasal drip, rhinorrhea, sinus pressure, sneezing, sore throat and trouble swallowing.   Eyes: Negative for redness and itching.  Respiratory: Positive for cough and shortness of breath. Negative for chest tightness and wheezing.     Cardiovascular: Negative for palpitations and leg swelling.  Gastrointestinal: Negative for nausea and vomiting.  Genitourinary: Negative for dysuria.  Musculoskeletal: Negative for joint swelling.  Skin: Negative for rash.  Neurological: Negative for headaches.  Hematological: Does not bruise/bleed easily.  Psychiatric/Behavioral: Negative for dysphoric mood. The patient is not nervous/anxious.        Objective:   Physical Exam Filed Vitals:   10/29/15 1552  BP: 134/92  Pulse: 74  Height: 5' 1"  (1.549 m)  Weight: 189 lb (85.73 kg)  SpO2: 94%   Gen: Pleasant, elderly kyphotic, in no distress,  normal affect  ENT: No lesions,  mouth clear,  oropharynx clear, no postnasal drip  Neck: No JVD, no TMG, no carotid bruits  Lungs: No use of accessory muscles, clear without rales or rhonchi  Cardiovascular: RRR, heart sounds normal, no murmur or gallops, no peripheral edema  Musculoskeletal: No deformities, no cyanosis or clubbing  Neuro: alert, non focal  Skin: Warm, no lesions or rashes    No past medical history on file.   Family History  Problem Relation Age of Onset  . Dementia Mother   . Hypertension Mother   . Deep vein thrombosis Father      Social History   Social History  . Marital Status: Widowed    Spouse Name: N/A  . Number of Children: N/A  . Years of Education: N/A   Occupational History  . Not on file.   Social History Main Topics  .  Smoking status: Former Smoker    Types: Cigarettes    Quit date: 08/24/2012  . Smokeless tobacco: Not on file  . Alcohol Use: Yes  . Drug Use: No  . Sexual Activity: Not on file   Other Topics Concern  . Not on file   Social History Narrative     No Known Allergies   Outpatient Prescriptions Prior to Visit  Medication Sig Dispense Refill  . cholecalciferol (VITAMIN D) 1000 units tablet Take 1,000 Units by mouth daily.    . Multiple Vitamin (MULTIVITAMIN WITH MINERALS) TABS tablet Take 1 tablet by  mouth daily.    . Multiple Vitamins-Minerals (OPTIVITE PO) Take 1 capsule by mouth daily.    Marland Kitchen omeprazole (PRILOSEC OTC) 20 MG tablet Take 20 mg by mouth daily.    Marland Kitchen PROAIR HFA 108 (90 Base) MCG/ACT inhaler Inhale 1 puff into the lungs every 4 (four) hours as needed.  3  . traZODone (DESYREL) 50 MG tablet Take 50 mg by mouth daily.    . vitamin C (ASCORBIC ACID) 500 MG tablet Take 500 mg by mouth daily.    . vitamin E 400 UNIT capsule Take 400 Units by mouth daily.     No facility-administered medications prior to visit.         Assessment & Plan:  COPD (chronic obstructive pulmonary disease) ([place]) Patient has emphysematous change on her CT scan of the chest. I do not have any primary function testing available at this time but I do believe she has clinically significant COPD. It may have only begun to manifest itself post radiation and with the evolution of some mild radiation scar. I like to treat her empirically with the bronchodilator to see if she benefits. Talked about the potential side effects including cough and dry mouth.  Non-small cell cancer of left lung [facility]) Now being followed by Dr. Julien Nordmann at the [facility]  Post-radiation pneumonitis [facility]) I do not see any evidence of active pneumonitis on CT scan of the chest, but there is some left-sided scarring that I presume is related to her XRT. There's no intervention to make at this time beyond following with serial imaging. I do believe that the evolving scar probably has caused her to reach a functional plateau. She is now having dyspnea. We will try to address her obstructive lung disease which is more modifiable than scarring

## 2015-10-29 NOTE — Telephone Encounter (Addendum)
Patient called reporting the "nurse called her yesterday at 4:00 pm but she wasn't home and believes she missed a call about CT results.  I'm going to see Dr. Lamonte Sakai this morning."  Shared with her the left lobe area is smaller with no new or progressive disease.

## 2015-10-29 NOTE — Assessment & Plan Note (Signed)
I do not see any evidence of active pneumonitis on CT scan of the chest, but there is some left-sided scarring that I presume is related to her XRT. There's no intervention to make at this time beyond following with serial imaging. I do believe that the evolving scar probably has caused her to reach a functional plateau. She is now having dyspnea. We will try to address her obstructive lung disease which is more modifiable than scarring

## 2015-10-29 NOTE — Assessment & Plan Note (Signed)
Patient has emphysematous change on her CT scan of the chest. I do not have any primary function testing available at this time but I do believe she has clinically significant COPD. It may have only begun to manifest itself post radiation and with the evolution of some mild radiation scar. I like to treat her empirically with the bronchodilator to see if she benefits. Talked about the potential side effects including cough and dry mouth.

## 2016-01-07 ENCOUNTER — Ambulatory Visit (INDEPENDENT_AMBULATORY_CARE_PROVIDER_SITE_OTHER): Payer: PPO | Admitting: Emergency Medicine

## 2016-01-07 ENCOUNTER — Encounter: Payer: Self-pay | Admitting: Emergency Medicine

## 2016-01-07 VITALS — BP 126/92 | HR 84 | Ht 61.0 in | Wt 182.0 lb

## 2016-01-07 DIAGNOSIS — R0600 Dyspnea, unspecified: Secondary | ICD-10-CM | POA: Diagnosis not present

## 2016-01-07 DIAGNOSIS — J449 Chronic obstructive pulmonary disease, unspecified: Secondary | ICD-10-CM | POA: Diagnosis not present

## 2016-01-07 DIAGNOSIS — C3492 Malignant neoplasm of unspecified part of left bronchus or lung: Secondary | ICD-10-CM | POA: Diagnosis not present

## 2016-01-07 LAB — PULMONARY FUNCTION TEST
DL/VA % PRED: 54 %
DL/VA: 2.4 ml/min/mmHg/L
DLCO cor % pred: 41 %
DLCO cor: 8.43 ml/min/mmHg
DLCO unc % pred: 45 %
DLCO unc: 9.14 ml/min/mmHg
FEF 25-75 Post: 1.22 L/sec
FEF 25-75 Pre: 1.01 L/sec
FEF2575-%Change-Post: 20 %
FEF2575-%PRED-PRE: 118 %
FEF2575-%Pred-Post: 142 %
FEV1-%Change-Post: 0 %
FEV1-%PRED-PRE: 138 %
FEV1-%Pred-Post: 138 %
FEV1-POST: 1.93 L
FEV1-PRE: 1.94 L
FEV1FVC-%Change-Post: 0 %
FEV1FVC-%Pred-Pre: 96 %
FEV6-%Change-Post: 0 %
FEV6-%PRED-PRE: 155 %
FEV6-%Pred-Post: 156 %
FEV6-POST: 2.79 L
FEV6-PRE: 2.77 L
FEV6FVC-%Change-Post: 0 %
FEV6FVC-%PRED-POST: 106 %
FEV6FVC-%PRED-PRE: 106 %
FVC-%CHANGE-POST: 0 %
FVC-%PRED-POST: 146 %
FVC-%PRED-PRE: 145 %
FVC-POST: 2.81 L
FVC-Pre: 2.8 L
POST FEV6/FVC RATIO: 99 %
PRE FEV1/FVC RATIO: 69 %
PRE FEV6/FVC RATIO: 99 %
Post FEV1/FVC ratio: 69 %
RV % PRED: 60 %
RV: 1.44 L
TLC % PRED: 88 %
TLC: 4.08 L

## 2016-01-07 NOTE — Assessment & Plan Note (Signed)
Follow-up CT scanning and visit with Dr. Julien Nordmann as planned

## 2016-01-07 NOTE — Assessment & Plan Note (Signed)
Multifactorial shortness of breath with likely contributions from postradiation scar, kyphosis, and mild obstructive disease that's been identified on pulmonary function testing. She also has a diffusion defect and I would like to assess her ambulatory oximetry.

## 2016-01-07 NOTE — Assessment & Plan Note (Signed)
Mild obstruction with significant hyperinflation and a diffusion defect noted on pulmonary function testing today. She is not sure that she has significantly benefited from [facility] so we will stop this medication to see if she misses it. If so then we will restart.

## 2016-01-07 NOTE — Progress Notes (Signed)
Subjective:    [name], female    DOB: 1927-02-17, 82 y.o.   MRN: [id number]  Shortness of Breath Pertinent negatives include no ear pain, fever, headaches, leg swelling, rash, rhinorrhea, sore throat, vomiting or wheezing.   82 yo former smoker (about 50-60 pk-yrs), otherwise very little past medical history. She had an episode of syncope (the 2nd time in 10 yrs) that sent her to the ED. Part of the eval included a CT chest, showed a mass in superior segment LLL. She feels well. Has not lost wt, has no cough.   Her insurance may dictate that this referral is out of network. We will need to sort out an exception or ensure that she can get care at [place] scan chest 11/30/14 -- 3.0 x 3.2 cm mass, compression fractures.   ROV 10/29/15 -- follow-up visit for this pleasant 82 year old woman with a history of tobacco use. I saw her first in April 2016 when she was noted to have a 3 cm mass in the superior segment of her left lower lobe. She was out of network and her subsequent evaluation happened at [facility]. She underwent a CT-guided needle biopsy that showed adenocarcinoma. She underwent stereotactic radiotherapy in 5 fractions. Her insurance is now changed and she will be able to get her care in [place]. She has met Dr. Julien Nordmann with thoracic oncology and will be followed here as well. A CT scan of her chest was performed on 10/25/15 that showed an interval decrease in the size of her left lower lobe mass without any evidence of recurrence. There is some adjacent interstitial disease that's probably related to evolving radiation change. She has noticed some increased exertional SOB, has evolved over about last 2 months.   ROV 01/07/16 -- Follow-up visit for patient with history of tobacco use, and a carcinoma the lung status post stereotactic radiation therapy. She has some radiation change and suspected scarring on her repeat CT scan of the chest. Not clear to me that she had  active radiation pneumonitis. Pulmonary function testing was done today and I personally reviewed the results. This shows significant hyperinflation with mild obstruction, normal total lung capacity with significantly decreased diffusion capacity. At her last visit we started Stiolto to see if she would benefit - she believes that it has helped her breathing some. The stiolto may cause her to cough, she also is having some swallowing dis coordination that can sometimes making her cough. She does not use SABA.   Review of Systems  Constitutional: Negative for fever and unexpected weight change.  HENT: Negative for congestion, dental problem, ear pain, nosebleeds, postnasal drip, rhinorrhea, sinus pressure, sneezing, sore throat and trouble swallowing.   Eyes: Negative for redness and itching.  Respiratory: Positive for cough and shortness of breath. Negative for chest tightness and wheezing.   Cardiovascular: Negative for palpitations and leg swelling.  Gastrointestinal: Negative for nausea and vomiting.  Genitourinary: Negative for dysuria.  Musculoskeletal: Negative for joint swelling.  Skin: Negative for rash.  Neurological: Negative for headaches.  Hematological: Does not bruise/bleed easily.  Psychiatric/Behavioral: Negative for dysphoric mood. The patient is not nervous/anxious.        Objective:   Physical Exam Filed Vitals:   01/07/16 1209  BP: 126/92  Pulse: 84  Height: 5' 1"  (1.549 m)  Weight: 182 lb (82.555 kg)  SpO2: 94%   Gen: Pleasant, elderly kyphotic, in no distress,  normal affect  ENT: No lesions,  mouth  clear,  oropharynx clear, no postnasal drip  Neck: No JVD, no TMG, no carotid bruits  Lungs: No use of accessory muscles, clear without rales or rhonchi  Cardiovascular: RRR, heart sounds normal, no murmur or gallops, no peripheral edema  Musculoskeletal: No deformities, no cyanosis or clubbing  Neuro: alert, non focal  Skin: Warm, no lesions or  rashes    No past medical history on file.   Family History  Problem Relation Age of Onset  . Dementia Mother   . Hypertension Mother   . Deep vein thrombosis Father      Social History   Social History  . Marital Status: Widowed    Spouse Name: N/A  . Number of Children: N/A  . Years of Education: N/A   Occupational History  . Not on file.   Social History Main Topics  . Smoking status: Former Smoker    Types: Cigarettes    Quit date: 08/24/2012  . Smokeless tobacco: Not on file  . Alcohol Use: Yes  . Drug Use: No  . Sexual Activity: Not on file   Other Topics Concern  . Not on file   Social History Narrative     No Known Allergies   Outpatient Prescriptions Prior to Visit  Medication Sig Dispense Refill  . cholecalciferol (VITAMIN D) 1000 units tablet Take 1,000 Units by mouth daily.    . Multiple Vitamin (MULTIVITAMIN WITH MINERALS) TABS tablet Take 1 tablet by mouth daily.    . Multiple Vitamins-Minerals (OPTIVITE PO) Take 1 capsule by mouth daily.    Marland Kitchen omeprazole (PRILOSEC OTC) 20 MG tablet Take 20 mg by mouth daily.    Marland Kitchen PROAIR HFA 108 (90 Base) MCG/ACT inhaler Inhale 1 puff into the lungs every 4 (four) hours as needed.  3  . Tiotropium Bromide-Olodaterol (STIOLTO RESPIMAT) 2.5-2.5 MCG/ACT AERS Inhale 2 puffs into the lungs daily. 1 Inhaler 5  . vitamin C (ASCORBIC ACID) 500 MG tablet Take 500 mg by mouth daily.    . vitamin E 400 UNIT capsule Take 400 Units by mouth daily.    . traZODone (DESYREL) 50 MG tablet Take 50 mg by mouth daily.     No facility-administered medications prior to visit.         Assessment & Plan:  Dyspnea Multifactorial shortness of breath with likely contributions from postradiation scar, kyphosis, and mild obstructive disease that's been identified on pulmonary function testing. She also has a diffusion defect and I would like to assess her ambulatory oximetry.   COPD (chronic obstructive pulmonary disease) ([facility]) Mild  obstruction with significant hyperinflation and a diffusion defect noted on pulmonary function testing today. She is not sure that she has significantly benefited from [facility] so we will stop this medication to see if she misses it. If so then we will restart.  Non-small cell cancer of left lung [facility]) Follow-up CT scanning and visit with Dr. Julien Nordmann as planned

## 2016-01-07 NOTE — Patient Instructions (Signed)
Your pulmonary function testing shows mild obstructive lung disease. We will try stopping Stiolto to see if he missed this medication. Keep your albuterol inhaler available to use 2 puffs if needed for sudden shortness of breath. Walking oximetry today Follow-up CT scan of the chest with Dr. Julien Nordmann as planned Follow with Dr Lamonte Sakai in 6 months or sooner if you have any problems

## 2016-01-07 NOTE — Progress Notes (Signed)
PFT done today. 

## 2016-01-23 DIAGNOSIS — J449 Chronic obstructive pulmonary disease, unspecified: Secondary | ICD-10-CM | POA: Diagnosis not present

## 2016-01-23 DIAGNOSIS — R600 Localized edema: Secondary | ICD-10-CM | POA: Diagnosis not present

## 2016-01-23 DIAGNOSIS — C3490 Malignant neoplasm of unspecified part of unspecified bronchus or lung: Secondary | ICD-10-CM | POA: Diagnosis not present

## 2016-01-23 DIAGNOSIS — F324 Major depressive disorder, single episode, in partial remission: Secondary | ICD-10-CM | POA: Diagnosis not present

## 2016-01-23 DIAGNOSIS — E559 Vitamin D deficiency, unspecified: Secondary | ICD-10-CM | POA: Diagnosis not present

## 2016-02-18 DIAGNOSIS — R600 Localized edema: Secondary | ICD-10-CM | POA: Diagnosis not present

## 2016-03-10 DIAGNOSIS — M549 Dorsalgia, unspecified: Secondary | ICD-10-CM | POA: Diagnosis not present

## 2016-03-10 DIAGNOSIS — Z79899 Other long term (current) drug therapy: Secondary | ICD-10-CM | POA: Diagnosis not present

## 2016-03-10 DIAGNOSIS — R600 Localized edema: Secondary | ICD-10-CM | POA: Diagnosis not present

## 2016-03-10 DIAGNOSIS — T148 Other injury of unspecified body region: Secondary | ICD-10-CM | POA: Diagnosis not present

## 2016-03-18 DIAGNOSIS — L821 Other seborrheic keratosis: Secondary | ICD-10-CM | POA: Diagnosis not present

## 2016-03-18 DIAGNOSIS — L858 Other specified epidermal thickening: Secondary | ICD-10-CM | POA: Diagnosis not present

## 2016-03-26 ENCOUNTER — Encounter: Payer: Self-pay | Admitting: Emergency Medicine

## 2016-03-26 ENCOUNTER — Ambulatory Visit (INDEPENDENT_AMBULATORY_CARE_PROVIDER_SITE_OTHER): Payer: PPO | Admitting: Emergency Medicine

## 2016-03-26 ENCOUNTER — Ambulatory Visit (INDEPENDENT_AMBULATORY_CARE_PROVIDER_SITE_OTHER)
Admission: RE | Admit: 2016-03-26 | Discharge: 2016-03-26 | Disposition: A | Discharge status: Home / Self Care | Payer: PPO | Source: Ambulatory Visit | Attending: Emergency Medicine | Admitting: Emergency Medicine

## 2016-03-26 VITALS — BP 130/88 | HR 91 | Ht 62.0 in | Wt 181.0 lb

## 2016-03-26 DIAGNOSIS — C3492 Malignant neoplasm of unspecified part of left bronchus or lung: Secondary | ICD-10-CM

## 2016-03-26 DIAGNOSIS — R0602 Shortness of breath: Secondary | ICD-10-CM | POA: Diagnosis not present

## 2016-03-26 DIAGNOSIS — R059 Cough, unspecified: Secondary | ICD-10-CM | POA: Insufficient documentation

## 2016-03-26 DIAGNOSIS — R05 Cough: Secondary | ICD-10-CM | POA: Diagnosis not present

## 2016-03-26 DIAGNOSIS — J449 Chronic obstructive pulmonary disease, unspecified: Secondary | ICD-10-CM

## 2016-03-26 DIAGNOSIS — R0600 Dyspnea, unspecified: Secondary | ICD-10-CM | POA: Diagnosis not present

## 2016-03-26 MED ORDER — FLUTICASONE PROPIONATE 50 MCG/ACT NA SUSP: 2.0000 | Freq: Every day | NASAL | 5 refills | Status: DC

## 2016-03-26 MED ORDER — PROAIR HFA 108 (90 BASE) MCG/ACT IN AERS: 1.0000 | INHALATION_SPRAY | RESPIRATORY_TRACT | 5 refills | Status: DC | PRN

## 2016-03-26 NOTE — Assessment & Plan Note (Signed)
Possible contributors including progression of her malignancy, her inhaled medication and an impact on her upper airway. Also consider chronic rhinitis which she mentions is an issue. I will start her empirically on fluticasone nasal spray to see if she benefits. If not then we will consider changing her inhaler regimen

## 2016-03-26 NOTE — Patient Instructions (Addendum)
CXR today Continue your Stiolto daily for now. We may consider changing at some point in the future.  Take albuterol 2 puffs up to every 4 hours if needed for shortness of breath.  Start fluticasone nasal spray, 2 sprays each side daily Get your CT scan chest as planned.  Follow with Dr Lamonte Sakai in 2 months or sooner if you have any problems.

## 2016-03-26 NOTE — Assessment & Plan Note (Addendum)
We will perform a CXR today to look for interval change. We will get your Ct scan as planned unless the CXR guides [place] to an interval increase in disease. Certainly progression of disease could be contributing to increased dyspnea and lethargy.

## 2016-03-26 NOTE — Assessment & Plan Note (Signed)
?   Whether stiolto is contributing to cough, whether she is delivering the med effectively which could be contributing to increased dyspnea. Continue her current regimen for now but I need to consider an alternative. I introduced the possibility of changing to nebulized medicine to her today. She will think about this.

## 2016-03-26 NOTE — Progress Notes (Signed)
Subjective:    [name], female    DOB: 1927-04-15, 82 y.o.   MRN: [id number]  Shortness of Breath  Pertinent negatives include no ear pain, fever, headaches, leg swelling, rash, rhinorrhea, sore throat, vomiting or wheezing.   82 yo former smoker (about 50-60 pk-yrs), otherwise very little past medical history. She had an episode of syncope (the 2nd time in 10 yrs) that sent her to the ED. Part of the eval included a CT chest, showed a mass in superior segment LLL. She feels well. Has not lost wt, has no cough.   Her insurance may dictate that this referral is out of network. We will need to sort out an exception or ensure that she can get care at [place] scan chest 11/30/14 -- 3.0 x 3.2 cm mass, compression fractures.   ROV 10/29/15 -- follow-up visit for this pleasant 82 year old woman with a history of tobacco use. I saw her first in April 2016 when she was noted to have a 3 cm mass in the superior segment of her left lower lobe. She was out of network and her subsequent evaluation happened at [facility]. She underwent a CT-guided needle biopsy that showed adenocarcinoma. She underwent stereotactic radiotherapy in 5 fractions. Her insurance is now changed and she will be able to get her care in [place]. She has met Dr. Julien Nordmann with thoracic oncology and will be followed here as well. A CT scan of her chest was performed on 10/25/15 that showed an interval decrease in the size of her left lower lobe mass without any evidence of recurrence. There is some adjacent interstitial disease that's probably related to evolving radiation change. She has noticed some increased exertional SOB, has evolved over about last 2 months.   ROV 01/07/16 -- Follow-up visit for patient with history of tobacco use, and a carcinoma the lung status post stereotactic radiation therapy. She has some radiation change and suspected scarring on her repeat CT scan of the chest. Not clear to me that she had  active radiation pneumonitis. Pulmonary function testing was done today and I personally reviewed the results. This shows significant hyperinflation with mild obstruction, normal total lung capacity with significantly decreased diffusion capacity. At her last visit we started Stiolto to see if she would benefit - she believes that it has helped her breathing some. The stiolto may cause her to cough, she also is having some swallowing dis coordination that can sometimes making her cough. She does not use SABA.   ROV 03/26/16 -- Patient has a history of tobacco use and history of an adenocarcinoma that has been treated with stereotactic radiation therapy. Also with a history of COPD based on hyperinflation and mild obstruction on pulmonary function testing. We started her on Stiolto. Most recent CT scan of the chest was done prior to our last visit on 10/25/15. She is due for a repeat scan end of this month and then f/u Dr Julien Nordmann soon after. She is having an increase in her cough. She believes that her exertional SOB is worse than our our last visit. She is also hearing wheezing over the last several days. Denies pain. Her cough is non-productive.  No hemoptysis. She believes that the [place] may be contributing to cough, unsure that she is getting it down effectively. She doesn't have an albuterol at this time. She is on prilosec, feels that her GERD is control. She does have nasal congestion that contributes to globus sensation. She has  been more tired lately. She was recently startred on maxide a few weeks ago, ? A contributor.     Review of Systems  Constitutional: Negative for fever and unexpected weight change.  HENT: Negative for congestion, dental problem, ear pain, nosebleeds, postnasal drip, rhinorrhea, sinus pressure, sneezing, sore throat and trouble swallowing.   Eyes: Negative for redness and itching.  Respiratory: Positive for cough and shortness of breath. Negative for chest tightness and  wheezing.   Cardiovascular: Negative for palpitations and leg swelling.  Gastrointestinal: Negative for nausea and vomiting.  Genitourinary: Negative for dysuria.  Musculoskeletal: Negative for joint swelling.  Skin: Negative for rash.  Neurological: Negative for headaches.  Hematological: Does not bruise/bleed easily.  Psychiatric/Behavioral: Negative for dysphoric mood. The patient is not nervous/anxious.        Objective:   Physical Exam Vitals:   03/26/16 1350  BP: 130/88  Pulse: 91  SpO2: 97%  Weight: 181 lb (82.1 kg)  Height: _0  (1.575 m)   Gen: Pleasant, elderly kyphotic, in no distress,  normal affect  ENT: No lesions,  mouth clear,  oropharynx clear, no postnasal drip  Neck: No JVD, no TMG, no carotid bruits  Lungs: No use of accessory muscles, clear without rales or rhonchi  Cardiovascular: RRR, heart sounds normal, no murmur or gallops, no peripheral edema  Musculoskeletal: No deformities, no cyanosis or clubbing  Neuro: alert, non focal  Skin: Warm, no lesions or rashes   10/25/15 --  COMPARISON:  06/10/2015.  11/30/2014.  FINDINGS: Mediastinum / Lymph Nodes: There is no axillary lymphadenopathy. 13 mm precarinal lymph node is unchanged in the interval. 9 mm short axis left hilar lymph node is associated with similar small lymph nodes in the right hilum. Heart size is normal. Coronary artery calcification is noted. Moderate to large hiatal hernia contains approximately 2/3 of the stomach.  Lungs / Pleura: Centrilobular and paraseptal emphysema is noted bilaterally with scattered areas of architectural distortion and parenchymal scarring. 3.2 cm lobulated mass in the superior segment of the left lower lobe seen on the 11/30/2014 exam now measures 2.1 x 1.9 cm. There is adjacent interstitial thickening and airspace disease compatible with evolving radiation change. No pleural effusion.  Upper Abdomen: Stable bilateral adrenal thickening without  nodule or mass. Visualized portions of the abdominal aorta show atherosclerotic wall calcification.  MSK / Soft Tissues: Bone windows reveal no worrisome lytic or sclerotic osseous lesions. Stable mid thoracic compression fractures.  IMPRESSION: 1. Interval decrease in size of the left lower lobe pulmonary lesion with associated adjacent post radiation changes. 2. No new or progressive disease. 3. Emphysema. 4. Moderate to large hiatal hernia. 5. Abdominal aortic atherosclerosis.       Assessment & Plan:  Non-small cell cancer of left lung ([place]) We will perform a CXR today to look for interval change. We will get your Ct scan as planned unless the CXR guides [place] to an interval increase in disease. Certainly progression of disease could be contributing to increased dyspnea and lethargy.  COPD (chronic obstructive pulmonary disease) ([place]) ? Whether stiolto is contributing to cough, whether she is delivering the med effectively which could be contributing to increased dyspnea. Continue her current regimen for now but I need to consider an alternative. I introduced the possibility of changing to nebulized medicine to her today. She will think about this.  Cough Possible contributors including progression of her malignancy, her inhaled medication and an impact on her upper airway. Also consider chronic rhinitis which  she mentions is an issue. I will start her empirically on fluticasone nasal spray to see if she benefits. If not then we will consider changing her inhaler regimen  Baltazar Apo, MD, PhD 03/26/2016, 2:31 PM [facility] [phone] or if no answer [phone]

## 2016-03-27 ENCOUNTER — Telehealth: Payer: Self-pay | Admitting: Emergency Medicine

## 2016-03-27 NOTE — Telephone Encounter (Signed)
RB night float last night so has not resulted CXR.   LMTCB

## 2016-03-27 NOTE — Telephone Encounter (Signed)
Will forward to RB for cxr results  Thanks

## 2016-03-27 NOTE — Telephone Encounter (Signed)
Pt returned call, and I let her know that cxr hasn't been resulted yet.Hillery Hunter

## 2016-03-30 NOTE — Telephone Encounter (Signed)
Please let her know that there is some scarring present in the area of her lung cancer treatment, bu no evidence to suggest that cancer is growing. For now I would hold off on repeat CT chest, get this as planned by oncology.

## 2016-03-30 NOTE — Telephone Encounter (Signed)
Patient aware of Dr. Agustina Caroli recommendations. Nothing further needed.

## 2016-04-08 DIAGNOSIS — B958 Unspecified staphylococcus as the cause of diseases classified elsewhere: Secondary | ICD-10-CM | POA: Diagnosis not present

## 2016-04-08 DIAGNOSIS — L02414 Cutaneous abscess of left upper limb: Secondary | ICD-10-CM | POA: Diagnosis not present

## 2016-04-08 DIAGNOSIS — L0889 Other specified local infections of the skin and subcutaneous tissue: Secondary | ICD-10-CM | POA: Diagnosis not present

## 2016-04-20 ENCOUNTER — Encounter (HOSPITAL_COMMUNITY): Payer: Self-pay

## 2016-04-20 ENCOUNTER — Ambulatory Visit (HOSPITAL_COMMUNITY)
Admission: RE | Admit: 2016-04-20 | Discharge: 2016-04-20 | Disposition: A | Discharge status: Home / Self Care | Payer: PPO | Source: Ambulatory Visit | Attending: Internal Medicine | Admitting: Internal Medicine

## 2016-04-20 ENCOUNTER — Other Ambulatory Visit (HOSPITAL_BASED_OUTPATIENT_CLINIC_OR_DEPARTMENT_OTHER): Payer: PPO

## 2016-04-20 DIAGNOSIS — C3432 Malignant neoplasm of lower lobe, left bronchus or lung: Secondary | ICD-10-CM | POA: Diagnosis not present

## 2016-04-20 DIAGNOSIS — M8580 Other specified disorders of bone density and structure, unspecified site: Secondary | ICD-10-CM | POA: Insufficient documentation

## 2016-04-20 DIAGNOSIS — R918 Other nonspecific abnormal finding of lung field: Secondary | ICD-10-CM | POA: Diagnosis not present

## 2016-04-20 DIAGNOSIS — J439 Emphysema, unspecified: Secondary | ICD-10-CM | POA: Insufficient documentation

## 2016-04-20 DIAGNOSIS — M438X4 Other specified deforming dorsopathies, thoracic region: Secondary | ICD-10-CM | POA: Diagnosis not present

## 2016-04-20 DIAGNOSIS — K449 Diaphragmatic hernia without obstruction or gangrene: Secondary | ICD-10-CM | POA: Insufficient documentation

## 2016-04-20 DIAGNOSIS — C3492 Malignant neoplasm of unspecified part of left bronchus or lung: Secondary | ICD-10-CM | POA: Diagnosis not present

## 2016-04-20 LAB — COMPREHENSIVE METABOLIC PANEL
ALBUMIN: 3.7 g/dL (ref 3.5–5.0)
ALK PHOS: 72 U/L (ref 40–150)
ALT: 13 U/L (ref 0–55)
AST: 26 U/L (ref 5–34)
Anion Gap: 10 mEq/L (ref 3–11)
BILIRUBIN TOTAL: 0.76 mg/dL (ref 0.20–1.20)
BUN: 10.5 mg/dL (ref 7.0–26.0)
CO2: 27 mEq/L (ref 22–29)
Calcium: 10.6 mg/dL — ABNORMAL HIGH (ref 8.4–10.4)
Chloride: 97 mEq/L — ABNORMAL LOW (ref 98–109)
Creatinine: 0.8 mg/dL (ref 0.6–1.1)
EGFR: 65 mL/min/{1.73_m2} — AB (ref >90)
GLUCOSE: 105 mg/dL (ref 70–140)
Potassium: 4 mEq/L (ref 3.5–5.1)
SODIUM: 134 meq/L — AB (ref 136–145)
TOTAL PROTEIN: 7.2 g/dL (ref 6.4–8.3)

## 2016-04-20 LAB — CBC WITH DIFFERENTIAL/PLATELET
BASO%: 1 % (ref 0.0–2.0)
Basophils Absolute: 0.1 10*3/uL (ref 0.0–0.1)
EOS ABS: 0.1 10*3/uL (ref 0.0–0.5)
EOS%: 1.1 % (ref 0.0–7.0)
HEMATOCRIT: 40.1 % (ref 34.8–46.6)
HEMOGLOBIN: 13.1 g/dL (ref 11.6–15.9)
LYMPH#: 0.9 10*3/uL (ref 0.9–3.3)
LYMPH%: 16.2 % (ref 14.0–49.7)
MCH: 28.9 pg (ref 25.1–34.0)
MCHC: 32.8 g/dL (ref 31.5–36.0)
MCV: 88.3 fL (ref 79.5–101.0)
MONO#: 0.4 10*3/uL (ref 0.1–0.9)
MONO%: 6.6 % (ref 0.0–14.0)
NEUT%: 75.1 % (ref 38.4–76.8)
NEUTROS ABS: 4.3 10*3/uL (ref 1.5–6.5)
Platelets: 195 10*3/uL (ref 145–400)
RBC: 4.55 10*6/uL (ref 3.70–5.45)
RDW: 14.7 % — AB (ref 11.2–14.5)
WBC: 5.7 10*3/uL (ref 3.9–10.3)

## 2016-04-20 MED ORDER — IOPAMIDOL (ISOVUE-300) INJECTION 61%
75.0000 mL | Freq: Once | INTRAVENOUS | Status: AC | PRN
  Administered 2016-04-20: 75 mL via INTRAVENOUS

## 2016-04-28 ENCOUNTER — Telehealth: Payer: Self-pay | Admitting: Internal Medicine

## 2016-04-28 ENCOUNTER — Ambulatory Visit: Payer: PPO | Admitting: Internal Medicine

## 2016-04-28 NOTE — Telephone Encounter (Signed)
PATIENT CALLED TO CANCEL APPT. WILL CALL BACK TO [facility]. 04/28/16

## 2016-05-22 ENCOUNTER — Other Ambulatory Visit: Payer: PPO

## 2016-05-27 ENCOUNTER — Ambulatory Visit: Payer: PPO | Admitting: Internal Medicine

## 2016-06-02 DIAGNOSIS — R5383 Other fatigue: Secondary | ICD-10-CM | POA: Diagnosis not present

## 2016-06-02 DIAGNOSIS — R5381 Other malaise: Secondary | ICD-10-CM | POA: Diagnosis not present

## 2016-06-02 DIAGNOSIS — M810 Age-related osteoporosis without current pathological fracture: Secondary | ICD-10-CM | POA: Diagnosis not present

## 2016-06-04 ENCOUNTER — Ambulatory Visit: Payer: PPO | Admitting: Emergency Medicine

## 2016-06-12 ENCOUNTER — Ambulatory Visit (INDEPENDENT_AMBULATORY_CARE_PROVIDER_SITE_OTHER): Payer: PPO | Admitting: Emergency Medicine

## 2016-06-12 ENCOUNTER — Encounter: Payer: Self-pay | Admitting: Emergency Medicine

## 2016-06-12 DIAGNOSIS — R05 Cough: Secondary | ICD-10-CM

## 2016-06-12 DIAGNOSIS — C3492 Malignant neoplasm of unspecified part of left bronchus or lung: Secondary | ICD-10-CM

## 2016-06-12 DIAGNOSIS — J700 Acute pulmonary manifestations due to radiation: Secondary | ICD-10-CM

## 2016-06-12 DIAGNOSIS — J449 Chronic obstructive pulmonary disease, unspecified: Secondary | ICD-10-CM

## 2016-06-12 DIAGNOSIS — R059 Cough, unspecified: Secondary | ICD-10-CM

## 2016-06-12 NOTE — Assessment & Plan Note (Signed)
Scar in the left lower lobe on CXR chest post XRT

## 2016-06-12 NOTE — Assessment & Plan Note (Signed)
No evidence of recurrent disease on her most recent CT scan done on 04/20/16

## 2016-06-12 NOTE — Patient Instructions (Signed)
Keep albuterol available to use 2 puffs as needed for shortness of breath. You may want to consider pre-treating exercise.  We will not restart fluticasone nasal spray right now.  Follow with Dr Lamonte Sakai in 6 months or sooner if you have any problems

## 2016-06-12 NOTE — Progress Notes (Signed)
Subjective:    [name], female    DOB: 1927-04-15, 82 y.o.   MRN: [id number]  Shortness of Breath  Pertinent negatives include no ear pain, fever, headaches, leg swelling, rash, rhinorrhea, sore throat, vomiting or wheezing.   82 yo former smoker (about 50-60 pk-yrs), otherwise very little past medical history. She had an episode of syncope (the 2nd time in 10 yrs) that sent her to the ED. Part of the eval included a CT chest, showed a mass in superior segment LLL. She feels well. Has not lost wt, has no cough.   Her insurance may dictate that this referral is out of network. We will need to sort out an exception or ensure that she can get care at [place] scan chest 11/30/14 -- 3.0 x 3.2 cm mass, compression fractures.   ROV 10/29/15 -- follow-up visit for this pleasant 82 year old woman with a history of tobacco use. I saw her first in April 2016 when she was noted to have a 3 cm mass in the superior segment of her left lower lobe. She was out of network and her subsequent evaluation happened at [facility]. She underwent a CT-guided needle biopsy that showed adenocarcinoma. She underwent stereotactic radiotherapy in 5 fractions. Her insurance is now changed and she will be able to get her care in [place]. She has met Dr. Julien Nordmann with thoracic oncology and will be followed here as well. A CT scan of her chest was performed on 10/25/15 that showed an interval decrease in the size of her left lower lobe mass without any evidence of recurrence. There is some adjacent interstitial disease that's probably related to evolving radiation change. She has noticed some increased exertional SOB, has evolved over about last 2 months.   ROV 01/07/16 -- Follow-up visit for patient with history of tobacco use, and a carcinoma the lung status post stereotactic radiation therapy. She has some radiation change and suspected scarring on her repeat CT scan of the chest. Not clear to me that she had  active radiation pneumonitis. Pulmonary function testing was done today and I personally reviewed the results. This shows significant hyperinflation with mild obstruction, normal total lung capacity with significantly decreased diffusion capacity. At her last visit we started Stiolto to see if she would benefit - she believes that it has helped her breathing some. The stiolto may cause her to cough, she also is having some swallowing dis coordination that can sometimes making her cough. She does not use SABA.   ROV 03/26/16 -- Patient has a history of tobacco use and history of an adenocarcinoma that has been treated with stereotactic radiation therapy. Also with a history of COPD based on hyperinflation and mild obstruction on pulmonary function testing. We started her on Stiolto. Most recent CT scan of the chest was done prior to our last visit on 10/25/15. She is due for a repeat scan end of this month and then f/u Dr Julien Nordmann soon after. She is having an increase in her cough. She believes that her exertional SOB is worse than our our last visit. She is also hearing wheezing over the last several days. Denies pain. Her cough is non-productive.  No hemoptysis. She believes that the [place] may be contributing to cough, unsure that she is getting it down effectively. She doesn't have an albuterol at this time. She is on prilosec, feels that her GERD is control. She does have nasal congestion that contributes to globus sensation. She has  been more tired lately. She was recently startred on maxide a few weeks ago, ? A contributor.   ROV 06/12/16 -- this is a follow-up visit for patient with a history of tobacco use, and a carcinoma the lung treated with SB RT, and COPD with mild obstruction on poor function testing. She tried to continue on Stiolto although we did discuss a possible contribution of this medication to cough. She stopped the stiolto and the cough got better. Also started her on fluticisone nasal spray  to see if she would benefit, now off of it. She had a CT scan of the chest on 04/20/16 that I have personally reviewed. Some increased consolidation and scarring in the superior segment of the left lower lobe and the adjacent left upper lobe.  No new areas of metastatic disease were identified. . She has ProAir to use prn, not every day.     Review of Systems  Constitutional: Negative for fever and unexpected weight change.  HENT: Negative for congestion, dental problem, ear pain, nosebleeds, postnasal drip, rhinorrhea, sinus pressure, sneezing, sore throat and trouble swallowing.   Eyes: Negative for redness and itching.  Respiratory: Positive for cough and shortness of breath. Negative for chest tightness and wheezing.   Cardiovascular: Negative for palpitations and leg swelling.  Gastrointestinal: Negative for nausea and vomiting.  Genitourinary: Negative for dysuria.  Musculoskeletal: Negative for joint swelling.  Skin: Negative for rash.  Neurological: Negative for headaches.  Hematological: Does not bruise/bleed easily.  Psychiatric/Behavioral: Negative for dysphoric mood. The patient is not nervous/anxious.        Objective:   Physical Exam Vitals:   06/12/16 1414 06/12/16 1415  BP:  (!) 150/86  Pulse:  60  SpO2:  94%  Weight: 178 lb (80.7 kg)   Height: '5\' 2"'$  (1.575 m)    Gen: Pleasant, elderly kyphotic, in no distress,  normal affect  ENT: No lesions,  mouth clear,  oropharynx clear, no postnasal drip  Neck: No JVD, no TMG, no carotid bruits  Lungs: No use of accessory muscles, clear without rales or rhonchi  Cardiovascular: RRR, heart sounds normal, no murmur or gallops, no peripheral edema  Musculoskeletal: No deformities, no cyanosis or clubbing  Neuro: alert, non focal  Skin: Warm, no lesions or rashes   10/25/15 --  COMPARISON:  06/10/2015.  11/30/2014.  FINDINGS: Mediastinum / Lymph Nodes: There is no axillary lymphadenopathy. 13 mm precarinal lymph  node is unchanged in the interval. 9 mm short axis left hilar lymph node is associated with similar small lymph nodes in the right hilum. Heart size is normal. Coronary artery calcification is noted. Moderate to large hiatal hernia contains approximately 2/3 of the stomach.  Lungs / Pleura: Centrilobular and paraseptal emphysema is noted bilaterally with scattered areas of architectural distortion and parenchymal scarring. 3.2 cm lobulated mass in the superior segment of the left lower lobe seen on the 11/30/2014 exam now measures 2.1 x 1.9 cm. There is adjacent interstitial thickening and airspace disease compatible with evolving radiation change. No pleural effusion.  Upper Abdomen: Stable bilateral adrenal thickening without nodule or mass. Visualized portions of the abdominal aorta show atherosclerotic wall calcification.  MSK / Soft Tissues: Bone windows reveal no worrisome lytic or sclerotic osseous lesions. Stable mid thoracic compression fractures.  IMPRESSION: 1. Interval decrease in size of the left lower lobe pulmonary lesion with associated adjacent post radiation changes. 2. No new or progressive disease. 3. Emphysema. 4. Moderate to large hiatal hernia. 5. Abdominal aortic  atherosclerosis.       Assessment & Plan:  COPD (chronic obstructive pulmonary disease) ([place]) She successfully stopped her Stiolto. This helped her cough and she did not miss it from a breathing standpoint. She continues use albuterol as needed, rarely. I talked her about possibly pretreating exercise. Offered her the flu shot she does not want to get it. Follow-up in 6 months  Non-small cell cancer of left lung ([facility]) No evidence of recurrent disease on her most recent CT scan done on 04/20/16  Post-radiation pneumonitis ([place]) Scar in the left lower lobe on CXR chest post XRT  Cough Better w d/c stiolto  Baltazar Apo, MD, PhD 06/12/2016, 2:43 PM [facility] [phone] or if no answer [phone]

## 2016-06-12 NOTE — Assessment & Plan Note (Signed)
She successfully stopped her Stiolto. This helped her cough and she did not miss it from a breathing standpoint. She continues use albuterol as needed, rarely. I talked her about possibly pretreating exercise. Offered her the flu shot she does not want to get it. Follow-up in 6 months

## 2016-06-12 NOTE — Assessment & Plan Note (Signed)
Better w d/c stiolto

## 2016-10-13 DIAGNOSIS — Z961 Presence of intraocular lens: Secondary | ICD-10-CM | POA: Diagnosis not present

## 2016-10-13 DIAGNOSIS — H353132 Nonexudative age-related macular degeneration, bilateral, intermediate dry stage: Secondary | ICD-10-CM | POA: Diagnosis not present

## 2016-12-17 ENCOUNTER — Encounter: Payer: Self-pay | Admitting: Emergency Medicine

## 2016-12-17 ENCOUNTER — Ambulatory Visit (INDEPENDENT_AMBULATORY_CARE_PROVIDER_SITE_OTHER): Payer: PPO | Admitting: Emergency Medicine

## 2016-12-17 DIAGNOSIS — C3492 Malignant neoplasm of unspecified part of left bronchus or lung: Secondary | ICD-10-CM | POA: Diagnosis not present

## 2016-12-17 DIAGNOSIS — J449 Chronic obstructive pulmonary disease, unspecified: Secondary | ICD-10-CM | POA: Diagnosis not present

## 2016-12-17 NOTE — Assessment & Plan Note (Signed)
Adenocarcinoma of the left lower lobe status post radiation therapy. We will follow her CT scan of the chest at least annually, next scan to be done August 2018.

## 2016-12-17 NOTE — Assessment & Plan Note (Signed)
Does not appear to be limiting her at this time. We were able to stop Stiolto. She is using albuterol as needed. She is happy with this regimen and we will continue it as ordered.

## 2016-12-17 NOTE — Addendum Note (Signed)
Addended by: Jannette Spanner on: 12/17/2016 12:37 PM   Modules accepted: Orders

## 2016-12-17 NOTE — Patient Instructions (Signed)
You will need a repeat CT scan of your chest in August 2018 without contrast to follow your left lung scarring.  Take albuterol 2 puffs up to every 4 hours if needed for shortness of breath or coughing spells.  Follow with Dr Lamonte Sakai in September to review your CT scan together.

## 2016-12-17 NOTE — Progress Notes (Signed)
Subjective:    [name], female    DOB: 1926/11/15, 83 y.o.   MRN: [id number]  Shortness of Breath  Pertinent negatives include no ear pain, fever, headaches, leg swelling, rash, rhinorrhea, sore throat, vomiting or wheezing.   83 yo former smoker (about 50-60 pk-yrs)  ROV 03/26/16 -- Patient has a history of tobacco use and history of an adenocarcinoma that has been treated with stereotactic radiation therapy. Also with a history of COPD based on hyperinflation and mild obstruction on pulmonary function testing. We started her on Stiolto. Most recent CT scan of the chest was done prior to our last visit on 10/25/15. She is due for a repeat scan end of this month and then f/u Dr Julien Nordmann soon after. She is having an increase in her cough. She believes that her exertional SOB is worse than our our last visit. She is also hearing wheezing over the last several days. Denies pain. Her cough is non-productive.  No hemoptysis. She believes that the [place] may be contributing to cough, unsure that she is getting it down effectively. She doesn't have an albuterol at this time. She is on prilosec, feels that her GERD is control. She does have nasal congestion that contributes to globus sensation. She has been more tired lately. She was recently startred on maxide a few weeks ago, ? A contributor.   ROV 06/12/16 -- this is a follow-up visit for patient with a history of tobacco use, and a carcinoma the lung treated with SB RT, and COPD with mild obstruction on poor function testing. She tried to continue on Stiolto although we did discuss a possible contribution of this medication to cough. She stopped the stiolto and the cough got better. Also started her on fluticisone nasal spray to see if she would benefit, now off of it. She had a CT scan of the chest on 04/20/16 that I have personally reviewed. Some increased consolidation and scarring in the superior segment of the left lower lobe and the  adjacent left upper lobe.  No new areas of metastatic disease were identified. . She has ProAir to use prn, not every day.   ROV 12/17/16 -- patient has a history of COPD, adenocarcinoma of the LLL treated with SBRT. She tells me that her breathing is OK at rest, has dyspnea with walking. She uses albuterol as needed, usually for coughing spells. She doesn't have cough frequently, non-productive. She is off flonase, she does take prilosec OTC. Minimal nasal congestion except w coughing spells.     Review of Systems  Constitutional: Negative for fever and unexpected weight change.  HENT: Negative for congestion, dental problem, ear pain, nosebleeds, postnasal drip, rhinorrhea, sinus pressure, sneezing, sore throat and trouble swallowing.   Eyes: Negative for redness and itching.  Respiratory: Positive for cough and shortness of breath. Negative for chest tightness and wheezing.   Cardiovascular: Negative for palpitations and leg swelling.  Gastrointestinal: Negative for nausea and vomiting.  Genitourinary: Negative for dysuria.  Musculoskeletal: Negative for joint swelling.  Skin: Negative for rash.  Neurological: Negative for headaches.  Hematological: Does not bruise/bleed easily.  Psychiatric/Behavioral: Negative for dysphoric mood. The patient is not nervous/anxious.        Objective:   Physical Exam Vitals:   12/17/16 1204  BP: 138/84  Pulse: 68  SpO2: 99%  Weight: 179 lb 3.2 oz (81.3 kg)  Height: '5\' 2"'$  (1.575 m)   Gen: Pleasant, elderly kyphotic, in no distress,  normal affect  ENT: No lesions,  mouth clear,  oropharynx clear, no postnasal drip  Neck: No JVD, no TMG, no carotid bruits  Lungs: No use of accessory muscles, clear without rales or rhonchi  Cardiovascular: RRR, heart sounds normal, no murmur or gallops, no peripheral edema  Musculoskeletal: No deformities, no cyanosis or clubbing  Neuro: alert, non focal  Skin: Warm, no lesions or rashes   10/25/15 --    COMPARISON:  06/10/2015.  11/30/2014.  FINDINGS: Mediastinum / Lymph Nodes: There is no axillary lymphadenopathy. 13 mm precarinal lymph node is unchanged in the interval. 9 mm short axis left hilar lymph node is associated with similar small lymph nodes in the right hilum. Heart size is normal. Coronary artery calcification is noted. Moderate to large hiatal hernia contains approximately 2/3 of the stomach.  Lungs / Pleura: Centrilobular and paraseptal emphysema is noted bilaterally with scattered areas of architectural distortion and parenchymal scarring. 3.2 cm lobulated mass in the superior segment of the left lower lobe seen on the 11/30/2014 exam now measures 2.1 x 1.9 cm. There is adjacent interstitial thickening and airspace disease compatible with evolving radiation change. No pleural effusion.  Upper Abdomen: Stable bilateral adrenal thickening without nodule or mass. Visualized portions of the abdominal aorta show atherosclerotic wall calcification.  MSK / Soft Tissues: Bone windows reveal no worrisome lytic or sclerotic osseous lesions. Stable mid thoracic compression fractures.  IMPRESSION: 1. Interval decrease in size of the left lower lobe pulmonary lesion with associated adjacent post radiation changes. 2. No new or progressive disease. 3. Emphysema. 4. Moderate to large hiatal hernia. 5. Abdominal aortic atherosclerosis.       Assessment & Plan:  COPD (chronic obstructive pulmonary disease) ([place]) Does not appear to be limiting her at this time. We were able to stop Stiolto. She is using albuterol as needed. She is happy with this regimen and we will continue it as ordered.  Non-small cell cancer of left lung (HCC) Adenocarcinoma of the left lower lobe status post radiation therapy. We will follow her CT scan of the chest at least annually, next scan to be done August 2018.  Baltazar Apo, MD, PhD 12/17/2016, 12:36 PM [facility] [phone] or if no answer [phone]

## 2016-12-18 ENCOUNTER — Ambulatory Visit: Payer: PPO | Admitting: Emergency Medicine

## 2017-02-01 DIAGNOSIS — S0501XA Injury of conjunctiva and corneal abrasion without foreign body, right eye, initial encounter: Secondary | ICD-10-CM | POA: Diagnosis not present

## 2017-02-03 DIAGNOSIS — S0501XD Injury of conjunctiva and corneal abrasion without foreign body, right eye, subsequent encounter: Secondary | ICD-10-CM | POA: Diagnosis not present

## 2017-02-08 DIAGNOSIS — H353132 Nonexudative age-related macular degeneration, bilateral, intermediate dry stage: Secondary | ICD-10-CM | POA: Diagnosis not present

## 2017-02-08 DIAGNOSIS — H18831 Recurrent erosion of cornea, right eye: Secondary | ICD-10-CM | POA: Diagnosis not present

## 2017-02-08 DIAGNOSIS — H26493 Other secondary cataract, bilateral: Secondary | ICD-10-CM | POA: Diagnosis not present

## 2017-02-08 DIAGNOSIS — Z961 Presence of intraocular lens: Secondary | ICD-10-CM | POA: Diagnosis not present

## 2017-02-12 DIAGNOSIS — Z961 Presence of intraocular lens: Secondary | ICD-10-CM | POA: Diagnosis not present

## 2017-02-12 DIAGNOSIS — H26491 Other secondary cataract, right eye: Secondary | ICD-10-CM | POA: Diagnosis not present

## 2017-02-22 DIAGNOSIS — B0052 Herpesviral keratitis: Secondary | ICD-10-CM | POA: Diagnosis not present

## 2017-02-22 DIAGNOSIS — B0051 Herpesviral iridocyclitis: Secondary | ICD-10-CM | POA: Diagnosis not present

## 2017-03-01 DIAGNOSIS — B0051 Herpesviral iridocyclitis: Secondary | ICD-10-CM | POA: Diagnosis not present

## 2017-03-01 DIAGNOSIS — B0052 Herpesviral keratitis: Secondary | ICD-10-CM | POA: Diagnosis not present

## 2017-03-01 DIAGNOSIS — H18211 Corneal edema secondary to contact lens, right eye: Secondary | ICD-10-CM | POA: Diagnosis not present

## 2017-03-10 DIAGNOSIS — B0051 Herpesviral iridocyclitis: Secondary | ICD-10-CM | POA: Diagnosis not present

## 2017-03-10 DIAGNOSIS — B0052 Herpesviral keratitis: Secondary | ICD-10-CM | POA: Diagnosis not present

## 2017-03-10 DIAGNOSIS — H18211 Corneal edema secondary to contact lens, right eye: Secondary | ICD-10-CM | POA: Diagnosis not present

## 2017-03-25 DIAGNOSIS — J439 Emphysema, unspecified: Secondary | ICD-10-CM | POA: Diagnosis not present

## 2017-03-25 DIAGNOSIS — D649 Anemia, unspecified: Secondary | ICD-10-CM | POA: Diagnosis not present

## 2017-03-25 DIAGNOSIS — R5383 Other fatigue: Secondary | ICD-10-CM | POA: Diagnosis not present

## 2017-04-07 DIAGNOSIS — B0051 Herpesviral iridocyclitis: Secondary | ICD-10-CM | POA: Diagnosis not present

## 2017-04-07 DIAGNOSIS — H18211 Corneal edema secondary to contact lens, right eye: Secondary | ICD-10-CM | POA: Diagnosis not present

## 2017-04-07 DIAGNOSIS — B0052 Herpesviral keratitis: Secondary | ICD-10-CM | POA: Diagnosis not present

## 2017-04-12 ENCOUNTER — Other Ambulatory Visit: Payer: PPO

## 2017-04-14 ENCOUNTER — Other Ambulatory Visit: Payer: Self-pay | Admitting: Emergency Medicine

## 2017-04-14 DIAGNOSIS — C3492 Malignant neoplasm of unspecified part of left bronchus or lung: Secondary | ICD-10-CM

## 2017-04-20 ENCOUNTER — Ambulatory Visit (INDEPENDENT_AMBULATORY_CARE_PROVIDER_SITE_OTHER)
Admission: RE | Admit: 2017-04-20 | Discharge: 2017-04-20 | Disposition: A | Discharge status: Home / Self Care | Payer: PPO | Source: Ambulatory Visit | Attending: Emergency Medicine | Admitting: Emergency Medicine

## 2017-04-20 ENCOUNTER — Ambulatory Visit: Payer: PPO | Admitting: Emergency Medicine

## 2017-04-20 DIAGNOSIS — J449 Chronic obstructive pulmonary disease, unspecified: Secondary | ICD-10-CM | POA: Diagnosis not present

## 2017-04-20 DIAGNOSIS — C3492 Malignant neoplasm of unspecified part of left bronchus or lung: Secondary | ICD-10-CM | POA: Diagnosis not present

## 2017-04-20 DIAGNOSIS — J439 Emphysema, unspecified: Secondary | ICD-10-CM | POA: Diagnosis not present

## 2017-04-28 DIAGNOSIS — B0051 Herpesviral iridocyclitis: Secondary | ICD-10-CM | POA: Diagnosis not present

## 2017-04-28 DIAGNOSIS — H18211 Corneal edema secondary to contact lens, right eye: Secondary | ICD-10-CM | POA: Diagnosis not present

## 2017-04-28 DIAGNOSIS — H04123 Dry eye syndrome of bilateral lacrimal glands: Secondary | ICD-10-CM | POA: Diagnosis not present

## 2017-04-28 DIAGNOSIS — B0052 Herpesviral keratitis: Secondary | ICD-10-CM | POA: Diagnosis not present

## 2017-05-17 ENCOUNTER — Encounter: Payer: Self-pay | Admitting: Emergency Medicine

## 2017-05-17 ENCOUNTER — Ambulatory Visit (INDEPENDENT_AMBULATORY_CARE_PROVIDER_SITE_OTHER): Payer: PPO | Admitting: Emergency Medicine

## 2017-05-17 DIAGNOSIS — R911 Solitary pulmonary nodule: Secondary | ICD-10-CM | POA: Diagnosis not present

## 2017-05-17 DIAGNOSIS — R05 Cough: Secondary | ICD-10-CM

## 2017-05-17 DIAGNOSIS — R059 Cough, unspecified: Secondary | ICD-10-CM

## 2017-05-17 MED ORDER — TIOTROPIUM BROMIDE-OLODATEROL 2.5-2.5 MCG/ACT IN AERS: 2.0000 | INHALATION_SPRAY | Freq: Every day | RESPIRATORY_TRACT | 0 refills | Status: AC

## 2017-05-17 NOTE — Patient Instructions (Addendum)
We will start Stiolto Respimat 2 puffs once a day. Please call our office if this medication helps you. If so we will order through your pharmacy. Take albuterol 2 puffs up to every 4 hours if needed for shortness of breath or cough Please restart flonase nasal spray, 2 sprays each nostril once a day We will repeat your CT scan of the chest without contrast in August 2019 or sooner if you develop any new breathing symptoms. Follow with Dr Lamonte Sakai in 3 months or sooner if you have any problems.

## 2017-05-17 NOTE — Addendum Note (Signed)
Addended by: Maryanna Shape A on: 05/17/2017 11:12 AM   Modules accepted: Orders

## 2017-05-17 NOTE — Assessment & Plan Note (Signed)
CT scan of the chest stable. Clinically stable. I will plan to repeat her CT in one year, sooner if she develops any symptoms.

## 2017-05-17 NOTE — Assessment & Plan Note (Signed)
Not currently on any standing bronchodilators. Unclear whether she ever benefited form stiolto. She is willing to retry. Also explained to her the times that she might want to try her SABA, w dyspnea etc. Consider repeat walking oximetry in the future if she does not improve. She does not want the flu shot.

## 2017-05-17 NOTE — Assessment & Plan Note (Signed)
Restart fluticasone nasal spray, plan to use through the Fall allergy season.

## 2017-05-17 NOTE — Progress Notes (Signed)
Subjective:    [name], female    DOB: 1927-06-29, 83 y.o.   MRN: [id number]  Shortness of Breath  Pertinent negatives include no ear pain, fever, headaches, leg swelling, rash, rhinorrhea, sore throat, vomiting or wheezing.   83 yo former smoker (about 50-60 pk-yrs)  ROV 06/12/16 -- this is a follow-up visit for patient with a history of tobacco use, and a carcinoma the lung treated with SB RT, and COPD with mild obstruction on poor function testing. She tried to continue on Stiolto although we did discuss a possible contribution of this medication to cough. She stopped the stiolto and the cough got better. Also started her on fluticisone nasal spray to see if she would benefit, now off of it. She had a CT scan of the chest on 04/20/16 that I have personally reviewed. Some increased consolidation and scarring in the superior segment of the left lower lobe and the adjacent left upper lobe.  No new areas of metastatic disease were identified. . She has ProAir to use prn, not every day.   ROV 12/17/16 -- patient has a history of COPD, adenocarcinoma of the LLL treated with SBRT. She tells me that her breathing is OK at rest, has dyspnea with walking. She uses albuterol as needed, usually for coughing spells. She doesn't have cough frequently, non-productive. She is off flonase, she does take prilosec OTC. Minimal nasal congestion except w coughing spells.   ROV 05/17/17 -- Patient has a history of adenocarcinoma of the left lower lobe that was treated with radiation therapy. Also COPD. She underwent a repeat CT scan of the chest on 04/20/17 that I have personally reviewed. This shows stable consolidative changes and apparent scar in the left lower lobe. I do not see any evidence for recurrence of her mass. She uses proair for coughing spells, rarely. She describes heavy breathing when she dresses and makes her bed. Denies any CP. She also has some recurrent chronic cough. She is off flonase.  She refuses the flu shot.     Review of Systems  Constitutional: Negative for fever and unexpected weight change.  HENT: Negative for congestion, dental problem, ear pain, nosebleeds, postnasal drip, rhinorrhea, sinus pressure, sneezing, sore throat and trouble swallowing.   Eyes: Negative for redness and itching.  Respiratory: Positive for cough and shortness of breath. Negative for chest tightness and wheezing.   Cardiovascular: Negative for palpitations and leg swelling.  Gastrointestinal: Negative for nausea and vomiting.  Genitourinary: Negative for dysuria.  Musculoskeletal: Negative for joint swelling.  Skin: Negative for rash.  Neurological: Negative for headaches.  Hematological: Does not bruise/bleed easily.  Psychiatric/Behavioral: Negative for dysphoric mood. The patient is not nervous/anxious.        Objective:   Physical Exam Vitals:   05/17/17 1020  BP: 132/76  Pulse: 71  SpO2: 100%  Weight: 166 lb (75.3 kg)  Height: 5\' 2"  (1.575 m)   Gen: Pleasant, elderly kyphotic, in no distress,  normal affect  ENT: No lesions,  mouth clear,  oropharynx clear, no postnasal drip  Neck: No JVD, no TMG, no carotid bruits  Lungs: No use of accessory muscles, clear without rales or rhonchi  Cardiovascular: RRR, heart sounds normal, no murmur or gallops, no peripheral edema  Musculoskeletal: No deformities, no cyanosis or clubbing  Neuro: alert, non focal  Skin: Warm, no lesions or rashes  04/20/17 --  COMPARISON:  04/20/2016 chest CT.  FINDINGS: Cardiovascular: Normal heart size. Stable trace anterior pericardial  effusion/thickening. Left anterior descending, left circumflex and right coronary atherosclerosis. Atherosclerotic nonaneurysmal thoracic aorta. Normal caliber pulmonary arteries.  Mediastinum/Nodes: No discrete thyroid nodules. Unremarkable esophagus. No pathologically enlarged axillary, mediastinal or gross hilar lymph nodes, noting limited  sensitivity for the detection of hilar adenopathy on this noncontrast study.  Lungs/Pleura: No pneumothorax. No right pleural effusion. Trace dependent left pleural effusion, not appreciably changed. Masslike fibrosis centered in the superior segment left lower lobe measures up to 6.1 x 5.1 cm (series 3/ image 63), previously 6.2 x 5.0 cm on 04/20/2016 using similar measurement technique, not appreciably changed in size, with interval mildly increased volume loss, distortion and bronchiectasis and more sharply defined margins, compatible with continued evolution of radiation fibrosis. Moderate centrilobular and paraseptal emphysema. No acute consolidative airspace disease or new significant pulmonary nodules. Mild patchy subpleural reticulation and ground-glass attenuation throughout both lungs is not appreciably changed and is nonspecific. No frank honeycombing.  Upper abdomen: Moderate to large hiatal hernia, stable.  Musculoskeletal: No aggressive appearing focal osseous lesions. Severe multilevel thoracic degenerative disc disease. Stable chronic severe T8 and T9 vertebral compression fractures.  IMPRESSION: 1. Continued evolution of masslike radiation fibrosis in the parahilar left mid lung. 2. No findings suspicious for local tumor recurrence or metastatic disease the chest . 3. Three-vessel coronary atherosclerosis. 4. Stable moderate to large hiatal hernia.      Assessment & Plan:  Non-small cell cancer of left lung (HCC) CT scan of the chest stable. Clinically stable. I will plan to repeat her CT in one year, sooner if she develops any symptoms.  COPD (chronic obstructive pulmonary disease) (HCC) Not currently on any standing bronchodilators. Unclear whether she ever benefited form stiolto. She is willing to retry. Also explained to her the times that she might want to try her SABA, w dyspnea etc. Consider repeat walking oximetry in the future if she does not  improve. She does not want the flu shot.   Cough Restart fluticasone nasal spray, plan to use through the Fall allergy season.   Baltazar Apo, MD, PhD 05/17/2017, 10:51 AM [facility] [phone] or if no answer [phone]

## 2017-05-28 ENCOUNTER — Ambulatory Visit: Payer: PPO | Admitting: Emergency Medicine

## 2017-06-07 ENCOUNTER — Telehealth: Payer: Self-pay | Admitting: Emergency Medicine

## 2017-06-07 MED ORDER — TIOTROPIUM BROMIDE-OLODATEROL 2.5-2.5 MCG/ACT IN AERS: 2.0000 | INHALATION_SPRAY | Freq: Every day | RESPIRATORY_TRACT | 3 refills | Status: DC

## 2017-06-07 NOTE — Telephone Encounter (Signed)
Rx for [employer] sent into [facility]. Left a detailed message on VM letting her know. Nothing further is needed.

## 2017-06-24 DIAGNOSIS — H353134 Nonexudative age-related macular degeneration, bilateral, advanced atrophic with subfoveal involvement: Secondary | ICD-10-CM | POA: Diagnosis not present

## 2017-06-24 DIAGNOSIS — H43813 Vitreous degeneration, bilateral: Secondary | ICD-10-CM | POA: Diagnosis not present

## 2017-06-24 DIAGNOSIS — H35033 Hypertensive retinopathy, bilateral: Secondary | ICD-10-CM | POA: Diagnosis not present

## 2017-06-24 DIAGNOSIS — H35423 Microcystoid degeneration of retina, bilateral: Secondary | ICD-10-CM | POA: Diagnosis not present

## 2017-06-25 ENCOUNTER — Emergency Department (HOSPITAL_COMMUNITY): Payer: PPO

## 2017-06-25 ENCOUNTER — Encounter (HOSPITAL_COMMUNITY): Payer: Self-pay

## 2017-06-25 ENCOUNTER — Ambulatory Visit (HOSPITAL_COMMUNITY)
Admission: EM | Admit: 2017-06-25 | Discharge: 2017-06-25 | Disposition: A | Discharge status: Home / Self Care | Payer: PPO | Source: Home / Self Care | Attending: Internal Medicine | Admitting: Internal Medicine

## 2017-06-25 ENCOUNTER — Encounter (HOSPITAL_COMMUNITY): Payer: Self-pay | Admitting: Emergency Medicine

## 2017-06-25 ENCOUNTER — Inpatient Hospital Stay (HOSPITAL_COMMUNITY)
Admission: EM | Admit: 2017-06-25 | Discharge: 2017-06-28 | DRG: 435 | Disposition: A | Discharge status: Home / Self Care | Payer: PPO | Attending: Internal Medicine | Admitting: Internal Medicine

## 2017-06-25 DIAGNOSIS — R8290 Unspecified abnormal findings in urine: Secondary | ICD-10-CM

## 2017-06-25 DIAGNOSIS — J700 Acute pulmonary manifestations due to radiation: Secondary | ICD-10-CM | POA: Diagnosis not present

## 2017-06-25 DIAGNOSIS — R17 Unspecified jaundice: Secondary | ICD-10-CM | POA: Diagnosis not present

## 2017-06-25 DIAGNOSIS — J449 Chronic obstructive pulmonary disease, unspecified: Secondary | ICD-10-CM | POA: Diagnosis present

## 2017-06-25 DIAGNOSIS — C259 Malignant neoplasm of pancreas, unspecified: Secondary | ICD-10-CM | POA: Diagnosis not present

## 2017-06-25 DIAGNOSIS — C3492 Malignant neoplasm of unspecified part of left bronchus or lung: Secondary | ICD-10-CM | POA: Diagnosis present

## 2017-06-25 DIAGNOSIS — Z9049 Acquired absence of other specified parts of digestive tract: Secondary | ICD-10-CM

## 2017-06-25 DIAGNOSIS — K831 Obstruction of bile duct: Secondary | ICD-10-CM | POA: Diagnosis not present

## 2017-06-25 DIAGNOSIS — Z85118 Personal history of other malignant neoplasm of bronchus and lung: Secondary | ICD-10-CM | POA: Diagnosis not present

## 2017-06-25 DIAGNOSIS — K5900 Constipation, unspecified: Secondary | ICD-10-CM | POA: Diagnosis not present

## 2017-06-25 DIAGNOSIS — Z8249 Family history of ischemic heart disease and other diseases of the circulatory system: Secondary | ICD-10-CM

## 2017-06-25 DIAGNOSIS — Z79899 Other long term (current) drug therapy: Secondary | ICD-10-CM | POA: Diagnosis not present

## 2017-06-25 DIAGNOSIS — R945 Abnormal results of liver function studies: Secondary | ICD-10-CM | POA: Diagnosis not present

## 2017-06-25 DIAGNOSIS — Z96641 Presence of right artificial hip joint: Secondary | ICD-10-CM | POA: Diagnosis not present

## 2017-06-25 DIAGNOSIS — Y842 Radiological procedure and radiotherapy as the cause of abnormal reaction of the patient, or of later complication, without mention of misadventure at the time of the procedure: Secondary | ICD-10-CM | POA: Diagnosis present

## 2017-06-25 DIAGNOSIS — R634 Abnormal weight loss: Secondary | ICD-10-CM | POA: Diagnosis not present

## 2017-06-25 DIAGNOSIS — Z87891 Personal history of nicotine dependence: Secondary | ICD-10-CM | POA: Diagnosis not present

## 2017-06-25 DIAGNOSIS — K449 Diaphragmatic hernia without obstruction or gangrene: Secondary | ICD-10-CM | POA: Diagnosis present

## 2017-06-25 DIAGNOSIS — Z96652 Presence of left artificial knee joint: Secondary | ICD-10-CM | POA: Diagnosis not present

## 2017-06-25 DIAGNOSIS — C801 Malignant (primary) neoplasm, unspecified: Secondary | ICD-10-CM | POA: Diagnosis not present

## 2017-06-25 DIAGNOSIS — R109 Unspecified abdominal pain: Secondary | ICD-10-CM | POA: Diagnosis not present

## 2017-06-25 DIAGNOSIS — R933 Abnormal findings on diagnostic imaging of other parts of digestive tract: Secondary | ICD-10-CM | POA: Diagnosis not present

## 2017-06-25 DIAGNOSIS — R350 Frequency of micturition: Secondary | ICD-10-CM

## 2017-06-25 DIAGNOSIS — Z923 Personal history of irradiation: Secondary | ICD-10-CM | POA: Diagnosis not present

## 2017-06-25 DIAGNOSIS — K838 Other specified diseases of biliary tract: Secondary | ICD-10-CM | POA: Diagnosis not present

## 2017-06-25 DIAGNOSIS — R112 Nausea with vomiting, unspecified: Secondary | ICD-10-CM | POA: Diagnosis not present

## 2017-06-25 DIAGNOSIS — R748 Abnormal levels of other serum enzymes: Secondary | ICD-10-CM | POA: Diagnosis not present

## 2017-06-25 DIAGNOSIS — C257 Malignant neoplasm of other parts of pancreas: Principal | ICD-10-CM | POA: Diagnosis present

## 2017-06-25 DIAGNOSIS — R932 Abnormal findings on diagnostic imaging of liver and biliary tract: Secondary | ICD-10-CM | POA: Diagnosis not present

## 2017-06-25 HISTORY — DX: Acute pulmonary manifestations due to radiation: J70.0

## 2017-06-25 HISTORY — DX: Malignant neoplasm of unspecified part of unspecified bronchus or lung: C34.90

## 2017-06-25 HISTORY — DX: Chronic obstructive pulmonary disease, unspecified: J44.9

## 2017-06-25 LAB — CBC WITH DIFFERENTIAL/PLATELET
BASOS PCT: 1 %
Basophils Absolute: 0.1 10*3/uL (ref 0.0–0.1)
Eosinophils Absolute: 0.1 10*3/uL (ref 0.0–0.7)
Eosinophils Relative: 1 %
HEMATOCRIT: 31.9 % — AB (ref 36.0–46.0)
HEMOGLOBIN: 11 g/dL — AB (ref 12.0–15.0)
LYMPHS ABS: 1.1 10*3/uL (ref 0.7–4.0)
Lymphocytes Relative: 22 %
MCH: 30.6 pg (ref 26.0–34.0)
MCHC: 34.5 g/dL (ref 30.0–36.0)
MCV: 88.9 fL (ref 78.0–100.0)
MONOS PCT: 8 %
Monocytes Absolute: 0.4 10*3/uL (ref 0.1–1.0)
NEUTROS ABS: 3.3 10*3/uL (ref 1.7–7.7)
NEUTROS PCT: 68 %
Platelets: 228 10*3/uL (ref 150–400)
RBC: 3.59 MIL/uL — ABNORMAL LOW (ref 3.87–5.11)
RDW: 14.7 % (ref 11.5–15.5)
WBC: 4.9 10*3/uL (ref 4.0–10.5)

## 2017-06-25 LAB — COMPREHENSIVE METABOLIC PANEL
ALT: 345 U/L — ABNORMAL HIGH (ref 14–54)
ANION GAP: 9 (ref 5–15)
AST: 393 U/L — ABNORMAL HIGH (ref 15–41)
Albumin: 3.6 g/dL (ref 3.5–5.0)
Alkaline Phosphatase: 634 U/L — ABNORMAL HIGH (ref 38–126)
BUN: 9 mg/dL (ref 6–20)
CALCIUM: 10.1 mg/dL (ref 8.9–10.3)
CO2: 23 mmol/L (ref 22–32)
Chloride: 101 mmol/L (ref 101–111)
Creatinine, Ser: 0.66 mg/dL (ref 0.44–1.00)
GFR calc non Af Amer: >60 mL/min (ref >60)
Glucose, Bld: 108 mg/dL — ABNORMAL HIGH (ref 65–99)
Potassium: 4.1 mmol/L (ref 3.5–5.1)
SODIUM: 133 mmol/L — AB (ref 135–145)
Total Bilirubin: 10.7 mg/dL — ABNORMAL HIGH (ref 0.3–1.2)
Total Protein: 6.7 g/dL (ref 6.5–8.1)

## 2017-06-25 LAB — URINALYSIS, ROUTINE W REFLEX MICROSCOPIC
Glucose, UA: NEGATIVE mg/dL
HGB URINE DIPSTICK: NEGATIVE
Ketones, ur: 5 mg/dL — AB
LEUKOCYTES UA: NEGATIVE
Nitrite: NEGATIVE
Protein, ur: NEGATIVE mg/dL
Specific Gravity, Urine: 1.02 (ref 1.005–1.030)
pH: 6 (ref 5.0–8.0)

## 2017-06-25 LAB — POCT URINALYSIS DIP (DEVICE)
Glucose, UA: 100 mg/dL — AB
Hgb urine dipstick: NEGATIVE
Ketones, ur: 15 mg/dL — AB
NITRITE: NEGATIVE
PH: 6 (ref 5.0–8.0)
Protein, ur: 100 mg/dL — AB
Specific Gravity, Urine: 1.02 (ref 1.005–1.030)

## 2017-06-25 LAB — LIPASE, BLOOD: Lipase: 47 U/L (ref 11–51)

## 2017-06-25 MED ORDER — ONDANSETRON HCL 4 MG PO TABS: 4.0000 mg | ORAL_TABLET | Freq: Four times a day (QID) | ORAL | Status: DC | PRN

## 2017-06-25 MED ORDER — UMECLIDINIUM BROMIDE 62.5 MCG/INH IN AEPB
1.0000 | INHALATION_SPRAY | Freq: Every day | RESPIRATORY_TRACT | Status: DC
  Administered 2017-06-26 – 2017-06-28 (×3): 1 via RESPIRATORY_TRACT
  Filled 2017-06-25: qty 7

## 2017-06-25 MED ORDER — ALBUTEROL SULFATE (2.5 MG/3ML) 0.083% IN NEBU: 3.0000 mL | INHALATION_SOLUTION | RESPIRATORY_TRACT | Status: DC | PRN

## 2017-06-25 MED ORDER — IOPAMIDOL (ISOVUE-300) INJECTION 61%
INTRAVENOUS | Status: AC
  Administered 2017-06-25: 100 mL
  Filled 2017-06-25: qty 100

## 2017-06-25 MED ORDER — VITAMIN D 1000 UNITS PO TABS
1000.0000 [IU] | ORAL_TABLET | Freq: Every day | ORAL | Status: DC
  Administered 2017-06-26 – 2017-06-28 (×3): 1000 [IU] via ORAL
  Filled 2017-06-25 (×3): qty 1

## 2017-06-25 MED ORDER — ARFORMOTEROL TARTRATE 15 MCG/2ML IN NEBU
15.0000 ug | INHALATION_SOLUTION | Freq: Two times a day (BID) | RESPIRATORY_TRACT | Status: DC
  Administered 2017-06-26 – 2017-06-28 (×3): 15 ug via RESPIRATORY_TRACT
  Filled 2017-06-25 (×7): qty 2

## 2017-06-25 MED ORDER — ONDANSETRON HCL 4 MG/2ML IJ SOLN
4.0000 mg | Freq: Four times a day (QID) | INTRAMUSCULAR | Status: DC | PRN
  Administered 2017-06-27: 4 mg via INTRAVENOUS
  Filled 2017-06-25: qty 2

## 2017-06-25 MED ORDER — ACETAMINOPHEN 650 MG RE SUPP: 650.0000 mg | Freq: Four times a day (QID) | RECTAL | Status: DC | PRN

## 2017-06-25 MED ORDER — PREDNISOLONE ACETATE 1 % OP SUSP
1.0000 [drp] | OPHTHALMIC | Status: DC
  Administered 2017-06-28: 1 [drp] via OPHTHALMIC
  Filled 2017-06-25: qty 1

## 2017-06-25 MED ORDER — SODIUM CHLORIDE 0.9 % IV BOLUS (SEPSIS)
1000.0000 mL | Freq: Once | INTRAVENOUS | Status: AC
  Administered 2017-06-25: 1000 mL via INTRAVENOUS

## 2017-06-25 MED ORDER — PANTOPRAZOLE SODIUM 40 MG PO TBEC
40.0000 mg | DELAYED_RELEASE_TABLET | Freq: Every day | ORAL | Status: DC
  Administered 2017-06-26: 40 mg via ORAL
  Filled 2017-06-25: qty 1

## 2017-06-25 MED ORDER — FLUTICASONE PROPIONATE 50 MCG/ACT NA SUSP: 1.0000 | NASAL | Status: DC | PRN

## 2017-06-25 MED ORDER — ACETAMINOPHEN 325 MG PO TABS: 650.0000 mg | ORAL_TABLET | Freq: Four times a day (QID) | ORAL | Status: DC | PRN

## 2017-06-25 NOTE — H&P (Signed)
History and Physical    Mercy Leppla FOY:[id number] DOB: 15-Oct-1926 DOA: 06/25/2017  PCP: Jonathon Jordan, MD  Patient coming from: Home  I have personally briefly reviewed patient's old medical records in [place]  Chief Complaint: Painless Jaundice  HPI: Carol Cook is a 83 y.o. female with medical history significant of NSCLC in 2017 s/p radiation, COPD.  Patient presents to the ED today after daughter noticed she looked Jaundiced today.  Patient lives alone.  Does have dark urine.  Denies pain or vomiting, but says she hasnt been eating recently due to nausea.   ED Course: Bili 10, AST and ALT in the 300s.  ALK 600.  CT abd/pelvis shows ill defined pancreatic lesion and dilation of biliary tree.  Of note CT 2 months ago was apparently unremarkable.   Review of Systems: As per HPI otherwise 10 point review of systems negative.   Past Medical History:  Diagnosis Date  . COPD (chronic obstructive pulmonary disease) ([place])   . Non-small cell lung cancer (NSCLC) ([place])   . Post-radiation pneumonitis [facility])     Past Surgical History:  Procedure Laterality Date  . APPENDECTOMY    . cataracts     surgery  . CHOLECYSTECTOMY    . REPLACEMENT TOTAL KNEE Left   . TOTAL HIP ARTHROPLASTY Right      reports that she quit smoking about 5 years ago. Her smoking use included Cigarettes. She has never used smokeless tobacco. She reports that she drinks alcohol. She reports that she does not use drugs.  No Known Allergies  Family History  Problem Relation Age of Onset  . Dementia Mother   . Hypertension Mother   . Deep vein thrombosis Father      Prior to Admission medications   Medication Sig Start Date End Date Taking? Authorizing Provider  cholecalciferol (VITAMIN D) 1000 units tablet Take 1,000 Units by mouth daily.   Yes [provider]  fluticasone (FLONASE) 50 MCG/ACT nasal spray Place 2 sprays into both nostrils daily. Patient taking differently: Place 1  spray into both nostrils as needed.  03/26/16  Yes Collene Gobble, MD  Multiple Vitamins-Minerals (OPTIVITE PO) Take 1 capsule by mouth daily.   Yes [provider]  omeprazole (PRILOSEC OTC) 20 MG tablet Take 20 mg by mouth daily.   Yes [provider]  prednisoLONE acetate (PRED FORTE) 1 % ophthalmic suspension INT 1 GTT IN OD D START ON mon. wed and friday 04/14/17  Yes [provider]  PROAIR HFA 108 (90 Base) MCG/ACT inhaler INHALE 1 PUFF INTO LUNGS EVERY 4 HOURS AS NEEDED 04/14/17  Yes Collene Gobble, MD  Tiotropium Bromide-Olodaterol (STIOLTO RESPIMAT) 2.5-2.5 MCG/ACT AERS Inhale 2 puffs into the lungs daily. 06/07/17  Yes Collene Gobble, MD  vitamin C (ASCORBIC ACID) 500 MG tablet Take 500 mg by mouth daily.   Yes [provider]  vitamin E 400 UNIT capsule Take 400 Units by mouth daily.   Yes [provider]    Physical Exam: Vitals:   06/25/17 2100 06/25/17 2115 06/25/17 2200 06/25/17 2230  BP: (!) 146/86 (!) 148/86 (!) 153/59 (!) 162/75  Pulse: 71 83 68 68  Resp: 20 (!) 24 12 (!) 23  Temp:      TempSrc:      SpO2: 92% 96% 98% 95%  Weight:      Height:        Constitutional: NAD, calm, comfortable Eyes: PERRL, Sclera icteric ENMT: Mucous membranes are  moist. Posterior pharynx clear of any exudate or lesions.Normal dentition.  Neck: normal, supple, no masses, no thyromegaly Respiratory: clear to auscultation bilaterally, no wheezing, no crackles. Normal respiratory effort. No accessory muscle use.  Cardiovascular: Regular rate and rhythm, no murmurs / rubs / gallops. No extremity edema. 2+ pedal pulses. No carotid bruits.  Abdomen: no tenderness, no masses palpated. No hepatosplenomegaly. Bowel sounds positive.  Musculoskeletal: no clubbing / cyanosis. No joint deformity upper and lower extremities. Good ROM, no contractures. Normal muscle tone.  Skin: no rashes, lesions, ulcers. No induration Neurologic: CN 2-12 grossly intact.  Sensation intact, DTR normal. Strength 5/5 in all 4.  Psychiatric: Normal judgment and insight. Alert and oriented x 3. Normal mood.    Labs on Admission: I have personally reviewed following labs and imaging studies  CBC:  Recent Labs Lab 06/25/17 1747  WBC 4.9  NEUTROABS 3.3  HGB 11.0*  HCT 31.9*  MCV 88.9  PLT 063   Basic Metabolic Panel:  Recent Labs Lab 06/25/17 1747  NA 133*  K 4.1  CL 101  CO2 23  GLUCOSE 108*  BUN 9  CREATININE 0.66  CALCIUM 10.1   GFR: Estimated Creatinine Clearance: 46 mL/min (by C-G formula based on SCr of 0.66 mg/dL). Liver Function Tests:  Recent Labs Lab 06/25/17 1747  AST 393*  ALT 345*  ALKPHOS 634*  BILITOT 10.7*  PROT 6.7  ALBUMIN 3.6    Recent Labs Lab 06/25/17 1747  LIPASE 47   No results for input(s): AMMONIA in the last 168 hours. Coagulation Profile: No results for input(s): INR, PROTIME in the last 168 hours. Cardiac Enzymes: No results for input(s): CKTOTAL, CKMB, CKMBINDEX, TROPONINI in the last 168 hours. BNP (last 3 results) No results for input(s): PROBNP in the last 8760 hours. HbA1C: No results for input(s): HGBA1C in the last 72 hours. CBG: No results for input(s): GLUCAP in the last 168 hours. Lipid Profile: No results for input(s): CHOL, HDL, LDLCALC, TRIG, CHOLHDL, LDLDIRECT in the last 72 hours. Thyroid Function Tests: No results for input(s): TSH, T4TOTAL, FREET4, T3FREE, THYROIDAB in the last 72 hours. Anemia Panel: No results for input(s): VITAMINB12, FOLATE, FERRITIN, TIBC, IRON, RETICCTPCT in the last 72 hours. Urine analysis:    Component Value Date/Time   COLORURINE AMBER (A) 06/25/2017 2304   APPEARANCEUR CLEAR 06/25/2017 2304   LABSPEC 1.020 06/25/2017 2304   PHURINE 6.0 06/25/2017 2304   GLUCOSEU NEGATIVE 06/25/2017 2304   HGBUR NEGATIVE 06/25/2017 2304   BILIRUBINUR SMALL (A) 06/25/2017 2304   KETONESUR 5 (A) 06/25/2017 2304   PROTEINUR NEGATIVE 06/25/2017 2304    UROBILINOGEN >=8.0 06/25/2017 1636   NITRITE NEGATIVE 06/25/2017 2304   LEUKOCYTESUR NEGATIVE 06/25/2017 2304    Radiological Exams on Admission: Ct Abdomen Pelvis W Contrast  Result Date: 06/25/2017 CLINICAL DATA:  83 year old female with painless jaundice and weight loss and fatigue. History of left lower lobe lung adenocarcinoma status post radiation treatment in September 2016. EXAM: CT ABDOMEN AND PELVIS WITH CONTRAST TECHNIQUE: Multidetector CT imaging of the abdomen and pelvis was performed using the standard protocol following bolus administration of intravenous contrast. CONTRAST:  ISOVUE-300 IOPAMIDOL (ISOVUE-300) INJECTION 61% COMPARISON:  Chest CT dated 04/20/2017 and 04/20/2016 FINDINGS: Evaluation is limited due to streak artifact caused by patient's arms and metallic right hip arthroplasty. Lower chest: There is subpleural emphysematous changes of the lung bases posteriorly. Three vessel coronary vascular atherosclerosis. There is no intra-abdominal free air or free fluid. Hepatobiliary: The liver is unremarkable. There  is diffuse dilatation of the intrahepatic biliary trees as well as diffuse dilatation of the common bile duct. The common bile duct measures up to 2.8 cm. This findings are new since the study of 04/20/2017. Pancreas: There is an ill-defined hypoenhancing soft tissue in the mid medial uncinate process of the pancreas measuring approximately 3.5 x 3.2 cm. There is abutment, and possible complete encasement, of the SMA with loss of the fat plane. Findings highly suspicious for primary pancreatic malignancy such as adenocarcinoma. Further evaluation with MRI, pancreatic for local/MRCP recommended. There is no dilatation of the pancreatic duct. Spleen: Normal in size without focal abnormality. Adrenals/Urinary Tract: The adrenal glands are unremarkable. There is a 3 cm left renal inferior pole cyst. Subcentimeter right renal inferior pole hypodensity is too small to characterize.  An area of hypodensity in the inferior pole of the right kidney posteriorly noted which also likely represents a cyst. There is no hydronephrosis on either side. The urinary bladder is grossly unremarkable but suboptimally visualized due to streak artifact caused by right hip arthroplasty. Stomach/Bowel: There is a large hiatal hernia. There is no bowel obstruction or active inflammation. There is moderate amount of stool within the colon. Appendectomy. Vascular/Lymphatic: There is advanced aortoiliac atherosclerotic disease. There is a 2.4 cm ectasia of infrarenal abdominal aorta. No portal venous gas identified. No adenopathy. Reproductive: The uterus is poorly visualized. Other: None Musculoskeletal: Osteopenia with extensive degenerative changes of the spine. Multilevel disc desiccation and vacuum phenomena. Total right hip arthroplasty. No acute osseous pathology. IMPRESSION: 1. Ill-defined hypoenhancing lesion in the uncinate process of the pancreas highly suspicious for pancreatic adenocarcinoma. Further evaluation with MRI/MRCP recommended. 2. Dilatation of the intrahepatic and intrahepatic biliary trees, not seen on the prior chest CT of 04/20/2017. 3. Apparent abutment or possibly encasement of the SMA by the pancreatic mass. 4. Large colonic stool burden. No bowel obstruction or active inflammation. 5. A 2.4 cm infrarenal aortic ectasia. 6.  Aortic Atherosclerosis (ICD10-I70.0). Electronically Signed   By: Anner Crete M.D.   On: 06/25/2017 22:26    EKG: Independently reviewed.  Assessment/Plan Principal Problem:   Obstructive jaundice due to malignant neoplasm ([place])    1. Obstructive jaundice due to mass of pancreas - 1. CA 19-9 2. Call GI in AM 3. Clear liquid diet after MN in case they want to do ERCP 4. Repeat CMP in AM  DVT prophylaxis: SCDs Code Status: Full Family Communication: Family at bedside Disposition Plan: Home after admit Consults called: None Admission status:  Admit to inpatient   [place], [place] [facility] Pager [phone]  If 7AM-7PM, please contact day team taking care of patient [email or web address] Password [facility]  06/25/2017, 11:44 PM

## 2017-06-25 NOTE — ED Provider Notes (Signed)
[facility] EMERGENCY DEPARTMENT Provider Note   CSN: [id number] Arrival date & time: 06/25/17  1732     History   Chief Complaint Chief Complaint  Patient presents with  . Abnormal Lab    HPI Carol Cook is a 83 y.o. female.  Pt presents to the ED today with jaundice.  Pt's daughter noticed that pt looked jaundiced today.  The pt lives alone, but across the street from the daughter.  The daughter sees her frequently, but did notice the jaundice until today.  The pt has noticed some dark urine.  She has been constipated, but that is not unusual for pt.  Pt denies any pain.  She initially said she's not had n/v, but does admit she has not been eating b/c of nausea.      History reviewed. No pertinent past medical history.  Patient Active Problem List   Diagnosis Date Noted  . Cough 03/26/2016  . Dyspnea 01/07/2016  . Post-radiation pneumonitis ([place]) 10/29/2015  . Non-small cell cancer of left lung ([place]) 10/23/2015  . COPD (chronic obstructive pulmonary disease) ([place]) 12/06/2014    Past Surgical History:  Procedure Laterality Date  . APPENDECTOMY    . cataracts     surgery  . CHOLECYSTECTOMY    . REPLACEMENT TOTAL KNEE Left   . TOTAL HIP ARTHROPLASTY Right     OB History    No data available       Home Medications    Prior to Admission medications   Medication Sig Start Date End Date Taking? Authorizing Provider  cholecalciferol (VITAMIN D) 1000 units tablet Take 1,000 Units by mouth daily.   Yes [provider]  fluticasone (FLONASE) 50 MCG/ACT nasal spray Place 2 sprays into both nostrils daily. Patient taking differently: Place 1 spray into both nostrils as needed.  03/26/16  Yes Collene Gobble, MD  Multiple Vitamins-Minerals (OPTIVITE PO) Take 1 capsule by mouth daily.   Yes [provider]  omeprazole (PRILOSEC OTC) 20 MG tablet Take 20 mg by mouth daily.   Yes [provider]  prednisoLONE acetate (PRED  FORTE) 1 % ophthalmic suspension INT 1 GTT IN OD D START ON mon. wed and friday 04/14/17  Yes [provider]  PROAIR HFA 108 (90 Base) MCG/ACT inhaler INHALE 1 PUFF INTO LUNGS EVERY 4 HOURS AS NEEDED 04/14/17  Yes Collene Gobble, MD  Tiotropium Bromide-Olodaterol (STIOLTO RESPIMAT) 2.5-2.5 MCG/ACT AERS Inhale 2 puffs into the lungs daily. 06/07/17  Yes Collene Gobble, MD  vitamin C (ASCORBIC ACID) 500 MG tablet Take 500 mg by mouth daily.   Yes [provider]  vitamin E 400 UNIT capsule Take 400 Units by mouth daily.   Yes [provider]    Family History Family History  Problem Relation Age of Onset  . Dementia Mother   . Hypertension Mother   . Deep vein thrombosis Father     Social History Social History  Substance Use Topics  . Smoking status: Former Smoker    Types: Cigarettes    Quit date: 08/25/2011  . Smokeless tobacco: Never Used  . Alcohol use Yes     Comment: occ     Allergies   Patient has no known allergies.   Review of Systems Review of Systems  Gastrointestinal: Positive for constipation and nausea.  Skin: Positive for color change.  All other systems reviewed and are negative.    Physical Exam Updated Vital Signs BP (!) 162/75  Pulse 68   Temp 97.8 F (36.6 C) (Oral)   Resp (!) 23   Ht 5\' 2"  (1.575 m)   Wt 74.8 kg (165 lb)   SpO2 95%   BMI 30.18 kg/m   Physical Exam  Constitutional: She is oriented to person, place, and time. She appears well-developed and well-nourished.  HENT:  Head: Normocephalic and atraumatic.  Right Ear: External ear normal.  Left Ear: External ear normal.  Nose: Nose normal.  Mouth/Throat: Oropharynx is clear and moist.  Eyes: Pupils are equal, round, and reactive to light. EOM are normal. Scleral icterus is present.  Neck: Normal range of motion. Neck supple.  Cardiovascular: Normal rate, regular rhythm, normal heart sounds and intact distal pulses.   Pulmonary/Chest: Effort normal and  breath sounds normal.  Abdominal: Soft. Bowel sounds are normal.  Musculoskeletal: Normal range of motion.  Neurological: She is alert and oriented to person, place, and time.  Skin: Skin is warm.  Pt is quite jaundiced.  Psychiatric: She has a normal mood and affect. Her behavior is normal. Judgment and thought content normal.  Nursing note and vitals reviewed.    ED Treatments / Results  Labs (all labs ordered are listed, but only abnormal results are displayed) Labs Reviewed  COMPREHENSIVE METABOLIC PANEL - Abnormal; Notable for the following:       Result Value   Sodium 133 (*)    Glucose, Bld 108 (*)    AST 393 (*)    ALT 345 (*)    Alkaline Phosphatase 634 (*)    Total Bilirubin 10.7 (*)    All other components within normal limits  CBC WITH DIFFERENTIAL/PLATELET - Abnormal; Notable for the following:    RBC 3.59 (*)    Hemoglobin 11.0 (*)    HCT 31.9 (*)    All other components within normal limits  LIPASE, BLOOD  URINALYSIS, ROUTINE W REFLEX MICROSCOPIC    EKG  EKG Interpretation None       Radiology Ct Abdomen Pelvis W Contrast  Result Date: 06/25/2017 CLINICAL DATA:  83 year old female with painless jaundice and weight loss and fatigue. History of left lower lobe lung adenocarcinoma status post radiation treatment in September 2016. EXAM: CT ABDOMEN AND PELVIS WITH CONTRAST TECHNIQUE: Multidetector CT imaging of the abdomen and pelvis was performed using the standard protocol following bolus administration of intravenous contrast. CONTRAST:  ISOVUE-300 IOPAMIDOL (ISOVUE-300) INJECTION 61% COMPARISON:  Chest CT dated 04/20/2017 and 04/20/2016 FINDINGS: Evaluation is limited due to streak artifact caused by patient's arms and metallic right hip arthroplasty. Lower chest: There is subpleural emphysematous changes of the lung bases posteriorly. Three vessel coronary vascular atherosclerosis. There is no intra-abdominal free air or free fluid. Hepatobiliary: The liver  is unremarkable. There is diffuse dilatation of the intrahepatic biliary trees as well as diffuse dilatation of the common bile duct. The common bile duct measures up to 2.8 cm. This findings are new since the study of 04/20/2017. Pancreas: There is an ill-defined hypoenhancing soft tissue in the mid medial uncinate process of the pancreas measuring approximately 3.5 x 3.2 cm. There is abutment, and possible complete encasement, of the SMA with loss of the fat plane. Findings highly suspicious for primary pancreatic malignancy such as adenocarcinoma. Further evaluation with MRI, pancreatic for local/MRCP recommended. There is no dilatation of the pancreatic duct. Spleen: Normal in size without focal abnormality. Adrenals/Urinary Tract: The adrenal glands are unremarkable. There is a 3 cm left renal inferior pole cyst. Subcentimeter right renal inferior  pole hypodensity is too small to characterize. An area of hypodensity in the inferior pole of the right kidney posteriorly noted which also likely represents a cyst. There is no hydronephrosis on either side. The urinary bladder is grossly unremarkable but suboptimally visualized due to streak artifact caused by right hip arthroplasty. Stomach/Bowel: There is a large hiatal hernia. There is no bowel obstruction or active inflammation. There is moderate amount of stool within the colon. Appendectomy. Vascular/Lymphatic: There is advanced aortoiliac atherosclerotic disease. There is a 2.4 cm ectasia of infrarenal abdominal aorta. No portal venous gas identified. No adenopathy. Reproductive: The uterus is poorly visualized. Other: None Musculoskeletal: Osteopenia with extensive degenerative changes of the spine. Multilevel disc desiccation and vacuum phenomena. Total right hip arthroplasty. No acute osseous pathology. IMPRESSION: 1. Ill-defined hypoenhancing lesion in the uncinate process of the pancreas highly suspicious for pancreatic adenocarcinoma. Further evaluation  with MRI/MRCP recommended. 2. Dilatation of the intrahepatic and intrahepatic biliary trees, not seen on the prior chest CT of 04/20/2017. 3. Apparent abutment or possibly encasement of the SMA by the pancreatic mass. 4. Large colonic stool burden. No bowel obstruction or active inflammation. 5. A 2.4 cm infrarenal aortic ectasia. 6.  Aortic Atherosclerosis (ICD10-I70.0). Electronically Signed   By: Anner Crete M.D.   On: 06/25/2017 22:26    Procedures Procedures (including critical care time)  Medications Ordered in ED Medications  sodium chloride 0.9 % bolus 1,000 mL (0 mLs Intravenous Stopped 06/25/17 2307)  iopamidol (ISOVUE-300) 61 % injection (100 mLs  Contrast Given 06/25/17 2152)     Initial Impression / Assessment and Plan / ED Course  I have reviewed the triage vital signs and the nursing notes.  Pertinent labs & imaging results that were available during my care of the patient were reviewed by me and considered in my medical decision making (see chart for details).     Pancreatic cancer new since 04/20/17.  It is causing obstruction, so she was d/w Dr. Alcario Drought (triad) for admission.    Final Clinical Impressions(s) / ED Diagnoses   Final diagnoses:  Malignant neoplasm of other parts of pancreas ([place])  Obstructive jaundice  Non-intractable vomiting with nausea, unspecified vomiting type    New Prescriptions New Prescriptions   No medications on file     Isla Pence, MD 06/25/17 2322

## 2017-06-25 NOTE — ED Notes (Signed)
Pharmacy bedside

## 2017-06-25 NOTE — ED Triage Notes (Signed)
Pt from [facility], pt complains of constipation x 10 days and low urine output during same time with dark urine. Decreased appetite. Pt has jaundice and daughter states she did not have that yesterday. Hypertensive in triage. AxOx4.

## 2017-06-25 NOTE — ED Provider Notes (Signed)
[facility]    CSN: [id number] Arrival date & time: 06/25/17  1610     History   Chief Complaint Chief Complaint  Patient presents with  . Constipation    HPI Carol Cook is a 83 y.o. female.   83 year old female with history of COPD, non-small cell lung cancer comes in for 10 day history of constipation. She is also experiencing dark urine and urinary urgency. She has also reported decreased appetite. Denies abdominal pain, nausea, vomiting. Denies fever, chills, night sweats.        History reviewed. No pertinent past medical history.  Patient Active Problem List   Diagnosis Date Noted  . Cough 03/26/2016  . Dyspnea 01/07/2016  . Post-radiation pneumonitis ([place]) 10/29/2015  . Non-small cell cancer of left lung ([place]) 10/23/2015  . COPD (chronic obstructive pulmonary disease) ([place]) 12/06/2014    Past Surgical History:  Procedure Laterality Date  . APPENDECTOMY    . cataracts     surgery  . CHOLECYSTECTOMY    . REPLACEMENT TOTAL KNEE Left   . TOTAL HIP ARTHROPLASTY Right     OB History    No data available       Home Medications    Prior to Admission medications   Medication Sig Start Date End Date Taking? Authorizing Provider  cholecalciferol (VITAMIN D) 1000 units tablet Take 1,000 Units by mouth daily.   Yes [provider]  fluticasone (FLONASE) 50 MCG/ACT nasal spray Place 2 sprays into both nostrils daily. Patient taking differently: Place 1 spray into both nostrils as needed.  03/26/16  Yes Collene Gobble, MD  Multiple Vitamins-Minerals (OPTIVITE PO) Take 1 capsule by mouth daily.   Yes [provider]  omeprazole (PRILOSEC OTC) 20 MG tablet Take 20 mg by mouth daily.   Yes [provider]  prednisoLONE acetate (PRED FORTE) 1 % ophthalmic suspension INT 1 GTT IN OD D START ON mon. wed and friday 04/14/17  Yes [provider]  PROAIR HFA 108 (90 Base) MCG/ACT inhaler INHALE 1 PUFF INTO LUNGS EVERY 4  HOURS AS NEEDED 04/14/17  Yes Collene Gobble, MD  Tiotropium Bromide-Olodaterol (STIOLTO RESPIMAT) 2.5-2.5 MCG/ACT AERS Inhale 2 puffs into the lungs daily. 06/07/17  Yes Collene Gobble, MD  vitamin E 400 UNIT capsule Take 400 Units by mouth daily.   Yes [provider]  vitamin C (ASCORBIC ACID) 500 MG tablet Take 500 mg by mouth daily.    [provider]    Family History Family History  Problem Relation Age of Onset  . Dementia Mother   . Hypertension Mother   . Deep vein thrombosis Father     Social History Social History  Substance Use Topics  . Smoking status: Former Smoker    Types: Cigarettes    Quit date: 08/25/2011  . Smokeless tobacco: Never Used  . Alcohol use Yes     Comment: occ     Allergies   Patient has no known allergies.   Review of Systems Review of Systems  Reason unable to perform ROS: See HPI as above.     Physical Exam Triage Vital Signs ED Triage Vitals [06/25/17 1626]  Enc Vitals Group     BP (!) 151/77     Pulse Rate 83     Resp 20     Temp 98.2 F (36.8 C)     Temp Source Oral     SpO2 96 %     Weight  Height      Head Circumference      Peak Flow      Pain Score      Pain Loc      Pain Edu?      Excl. in [place]?    No data found.   Updated Vital Signs BP (!) 151/77 (BP Location: Left Arm)   Pulse 83   Temp 98.2 F (36.8 C) (Oral)   Resp 20   SpO2 96%   Physical Exam  Constitutional: She is oriented to person, place, and time. She appears well-developed and well-nourished. No distress.  HENT:  Head: Normocephalic and atraumatic.  Eyes: Pupils are equal, round, and reactive to light.  Conjunctival icterus  Neurological: She is alert and oriented to person, place, and time.  Skin:  Patient with jaundice, daughter states onset today.     UC Treatments / Results  Labs (all labs ordered are listed, but only abnormal results are displayed) Labs Reviewed  POCT URINALYSIS DIP (DEVICE) - Abnormal;  Notable for the following:       Result Value   Glucose, UA 100 (*)    Bilirubin Urine LARGE (*)    Ketones, ur 15 (*)    Protein, ur 100 (*)    Leukocytes, UA LARGE (*)    All other components within normal limits    EKG  EKG Interpretation None       Radiology No results found.  Procedures Procedures (including critical care time)  Medications Ordered in UC Medications - No data to display   Initial Impression / Assessment and Plan / UC Course  I have reviewed the triage vital signs and the nursing notes.  Pertinent labs & imaging results that were available during my care of the patient were reviewed by me and considered in my medical decision making (see chart for details).    Given patient with new onset of jaundice, will need further evaluation. Discharged in stable condition to the emergency department for further evaluation and treatment.   Final Clinical Impressions(s) / UC Diagnoses   Final diagnoses:  Jaundice    New Prescriptions Discharge Medication List as of 06/25/2017  5:23 PM         Ok Edwards, PA-C 06/25/17 2102

## 2017-06-25 NOTE — ED Notes (Signed)
Pt adv to go to [facility] ED and NPO... Pt and daughter verb understanding.

## 2017-06-25 NOTE — ED Triage Notes (Signed)
Pt here for constipation onset 10 days  Also c/o dark urine color w/urgency  Also reports decreased appetite  Denies fevers, chill, n/v/d  A&O x4... NAD... Brought back on wheel chair.

## 2017-06-25 NOTE — ED Notes (Signed)
MD bedside for evaluation. Pt states she would just like to go home, has no complaints at this time.

## 2017-06-25 NOTE — Discharge Instructions (Signed)
Given new onset jaundice, please go to the emergency department immediately for further evaluation.

## 2017-06-26 ENCOUNTER — Inpatient Hospital Stay (HOSPITAL_COMMUNITY): Payer: PPO

## 2017-06-26 DIAGNOSIS — J449 Chronic obstructive pulmonary disease, unspecified: Secondary | ICD-10-CM

## 2017-06-26 LAB — CBC
HCT: 28.6 % — ABNORMAL LOW (ref 36.0–46.0)
Hemoglobin: 10 g/dL — ABNORMAL LOW (ref 12.0–15.0)
MCH: 31.4 pg (ref 26.0–34.0)
MCHC: 35 g/dL (ref 30.0–36.0)
MCV: 89.9 fL (ref 78.0–100.0)
PLATELETS: 207 10*3/uL (ref 150–400)
RBC: 3.18 MIL/uL — AB (ref 3.87–5.11)
RDW: 14.4 % (ref 11.5–15.5)
WBC: 4.2 10*3/uL (ref 4.0–10.5)

## 2017-06-26 LAB — COMPREHENSIVE METABOLIC PANEL
ALBUMIN: 3 g/dL — AB (ref 3.5–5.0)
ALK PHOS: 564 U/L — AB (ref 38–126)
ALT: 321 U/L — AB (ref 14–54)
ANION GAP: 7 (ref 5–15)
AST: 361 U/L — ABNORMAL HIGH (ref 15–41)
BILIRUBIN TOTAL: 10.3 mg/dL — AB (ref 0.3–1.2)
BUN: 5 mg/dL — AB (ref 6–20)
CALCIUM: 9.5 mg/dL (ref 8.9–10.3)
CO2: 24 mmol/L (ref 22–32)
CREATININE: 0.6 mg/dL (ref 0.44–1.00)
Chloride: 103 mmol/L (ref 101–111)
GFR calc Af Amer: >60 mL/min (ref >60)
GFR calc non Af Amer: >60 mL/min (ref >60)
GLUCOSE: 128 mg/dL — AB (ref 65–99)
Potassium: 3.3 mmol/L — ABNORMAL LOW (ref 3.5–5.1)
SODIUM: 134 mmol/L — AB (ref 135–145)
TOTAL PROTEIN: 5.6 g/dL — AB (ref 6.5–8.1)

## 2017-06-26 MED ORDER — SODIUM CHLORIDE 0.9 % IV SOLN
1.5000 g | Freq: Once | INTRAVENOUS | Status: AC
  Administered 2017-06-26: 1.5 g via INTRAVENOUS
  Filled 2017-06-26: qty 1.5

## 2017-06-26 MED ORDER — BOOST / RESOURCE BREEZE PO LIQD
1.0000 | Freq: Three times a day (TID) | ORAL | Status: DC
  Administered 2017-06-26 – 2017-06-28 (×4): 1 via ORAL

## 2017-06-26 MED ORDER — GADOBENATE DIMEGLUMINE 529 MG/ML IV SOLN
14.0000 mL | Freq: Once | INTRAVENOUS | Status: AC
  Administered 2017-06-26: 14 mL via INTRAVENOUS

## 2017-06-26 MED ORDER — PANTOPRAZOLE SODIUM 40 MG PO TBEC
40.0000 mg | DELAYED_RELEASE_TABLET | Freq: Two times a day (BID) | ORAL | Status: DC
  Administered 2017-06-26 – 2017-06-28 (×4): 40 mg via ORAL
  Filled 2017-06-26 (×4): qty 1

## 2017-06-26 MED ORDER — SODIUM CHLORIDE 0.9 % IV SOLN
INTRAVENOUS | Status: DC
  Administered 2017-06-26: 21:00:00 via INTRAVENOUS

## 2017-06-26 MED ORDER — LORAZEPAM 2 MG/ML IJ SOLN
0.5000 mg | INTRAMUSCULAR | Status: DC | PRN
  Administered 2017-06-26: 0.5 mg via INTRAVENOUS
  Filled 2017-06-26: qty 1

## 2017-06-26 MED ORDER — SUCRALFATE 1 GM/10ML PO SUSP: 1.0000 g | Freq: Three times a day (TID) | ORAL | Status: DC

## 2017-06-26 MED ORDER — DOCUSATE SODIUM 100 MG PO CAPS: 100.0000 mg | ORAL_CAPSULE | Freq: Every day | ORAL | Status: DC | PRN

## 2017-06-26 MED ORDER — ADULT MULTIVITAMIN LIQUID CH
15.0000 mL | Freq: Every day | ORAL | Status: DC
  Administered 2017-06-27 – 2017-06-28 (×2): 15 mL via ORAL
  Filled 2017-06-26 (×3): qty 15

## 2017-06-26 MED ORDER — SODIUM CHLORIDE 0.9 % IV SOLN: INTRAVENOUS | Status: DC

## 2017-06-26 NOTE — Progress Notes (Signed)
Pt admitted from ED with the c/o of jaundice, pt alert and oriented, denies any pain at this time, pt settled in bed with call light within pt's reach, safety concern addressed accordingly, pt reassured and will continue to monitor, v/s stable . [name], Carol Cook

## 2017-06-26 NOTE — ED Notes (Signed)
This RN asked pt when her last bowel movement was, pt reports last bowel movement was on Tuesday of this week. Denies abdominal pain.

## 2017-06-26 NOTE — Consult Note (Signed)
Referring Provider: Dr. Cruzita Lederer Primary Care Physician:  Jonathon Jordan, MD Primary Gastroenterologist:  Althia Forts  Reason for Consultation:  Pancreatic Mass  HPI: Carol Cook is a 83 y.o. female with a history of NSCLC in 2017 treated with radiation came to the ER with jaundice. Has been having nausea. Denies vomiting. Denies abdominal pain. Has lost 30 pounds in the last year that she reports has been intentional. TB 10.7, ALP 634, AST 393, ALT 345, Lipase 47. CT scan showed a pancreatic mass in the uncinate process with intrahepatic and extrahepatic ductal dilation and an MRCP was subsequently done that also showed this pancreatic mass with abutting of the SMA/SMV. She has had a poor appetite this week and has felt very weak. Her son and daughter are in the room.  Past Medical History:  Diagnosis Date  . COPD (chronic obstructive pulmonary disease) ([place])   . Non-small cell lung cancer (NSCLC) ([place])   . Post-radiation pneumonitis [facility])     Past Surgical History:  Procedure Laterality Date  . APPENDECTOMY    . cataracts     surgery  . CHOLECYSTECTOMY    . REPLACEMENT TOTAL KNEE Left   . TOTAL HIP ARTHROPLASTY Right     Prior to Admission medications   Medication Sig Start Date End Date Taking? Authorizing Provider  cholecalciferol (VITAMIN D) 1000 units tablet Take 1,000 Units by mouth daily.   Yes [provider]  fluticasone (FLONASE) 50 MCG/ACT nasal spray Place 2 sprays into both nostrils daily. Patient taking differently: Place 1 spray into both nostrils as needed.  03/26/16  Yes Collene Gobble, MD  Multiple Vitamins-Minerals (OPTIVITE PO) Take 1 capsule by mouth daily.   Yes [provider]  omeprazole (PRILOSEC OTC) 20 MG tablet Take 20 mg by mouth daily.   Yes [provider]  prednisoLONE acetate (PRED FORTE) 1 % ophthalmic suspension INT 1 GTT IN OD D START ON mon. wed and friday 04/14/17  Yes [provider]  PROAIR HFA 108 (90 Base)  MCG/ACT inhaler INHALE 1 PUFF INTO LUNGS EVERY 4 HOURS AS NEEDED 04/14/17  Yes Collene Gobble, MD  Tiotropium Bromide-Olodaterol (STIOLTO RESPIMAT) 2.5-2.5 MCG/ACT AERS Inhale 2 puffs into the lungs daily. 06/07/17  Yes Collene Gobble, MD  vitamin C (ASCORBIC ACID) 500 MG tablet Take 500 mg by mouth daily.   Yes [provider]  vitamin E 400 UNIT capsule Take 400 Units by mouth daily.   Yes [provider]    Scheduled Meds: . arformoterol  15 mcg Nebulization BID  . cholecalciferol  1,000 Units Oral Daily  . feeding supplement  1 Container Oral TID BM  . multivitamin  15 mL Oral Daily  . pantoprazole  40 mg Oral Daily  . [START ON 06/28/2017] prednisoLONE acetate  1 drop Right Eye Q M,W,F  . sucralfate  1 g Oral TID WC & HS  . umeclidinium bromide  1 puff Inhalation Daily   Continuous Infusions: PRN Meds:.acetaminophen **OR** acetaminophen, albuterol, docusate sodium, fluticasone, LORazepam, ondansetron **OR** ondansetron (ZOFRAN) IV  Allergies as of 06/25/2017  . (No Known Allergies)    Family History  Problem Relation Age of Onset  . Dementia Mother   . Hypertension Mother   . Deep vein thrombosis Father     Social History   Social History  . Marital status: Widowed    Spouse name: N/A  . Number of children: N/A  . Years of education: N/A   Occupational History  .  Not on file.   Social History Main Topics  . Smoking status: Former Smoker    Types: Cigarettes    Quit date: 08/25/2011  . Smokeless tobacco: Never Used  . Alcohol use Yes     Comment: occ  . Drug use: No  . Sexual activity: Not on file   Other Topics Concern  . Not on file   Social History Narrative  . No narrative on file    Review of Systems: All negative except as stated above in HPI.  Physical Exam: Vital signs: Vitals:   06/26/17 1300 06/26/17 1736  BP: (!) 144/82 140/75  Pulse: 79 80  Resp: 20 20  Temp:  (!) 97.5 F (36.4 C)  SpO2: 97% 98%     General:    Elderly, lethargic, jaundice, pleasant, well-nourished Head: normocephalic, atraumatic Eyes: +icteric sclera ENT: oropharynx clear Neck: supple, nontender Lungs:  Clear throughout to auscultation.   No wheezes, crackles, or rhonchi. No acute distress. Heart:  Regular rate and rhythm; no murmurs, clicks, rubs,  or gallops. Abdomen: soft, nontender, nondistended, +BS  Rectal:  Deferred Ext: no edema  GI:  Lab Results:  Recent Labs  06/25/17 1747 06/26/17 0245  WBC 4.9 4.2  HGB 11.0* 10.0*  HCT 31.9* 28.6*  PLT 228 207   BMET  Recent Labs  06/25/17 1747 06/26/17 0245  NA 133* 134*  K 4.1 3.3*  CL 101 103  CO2 23 24  GLUCOSE 108* 128*  BUN 9 5*  CREATININE 0.66 0.60  CALCIUM 10.1 9.5   LFT  Recent Labs  06/26/17 0245  PROT 5.6*  ALBUMIN 3.0*  AST 361*  ALT 321*  ALKPHOS 564*  BILITOT 10.3*   PT/INR No results for input(s): LABPROT, INR in the last 72 hours.   Studies/Results: Ct Abdomen Pelvis W Contrast  Result Date: 06/25/2017 CLINICAL DATA:  83 year old female with painless jaundice and weight loss and fatigue. History of left lower lobe lung adenocarcinoma status post radiation treatment in September 2016. EXAM: CT ABDOMEN AND PELVIS WITH CONTRAST TECHNIQUE: Multidetector CT imaging of the abdomen and pelvis was performed using the standard protocol following bolus administration of intravenous contrast. CONTRAST:  ISOVUE-300 IOPAMIDOL (ISOVUE-300) INJECTION 61% COMPARISON:  Chest CT dated 04/20/2017 and 04/20/2016 FINDINGS: Evaluation is limited due to streak artifact caused by patient's arms and metallic right hip arthroplasty. Lower chest: There is subpleural emphysematous changes of the lung bases posteriorly. Three vessel coronary vascular atherosclerosis. There is no intra-abdominal free air or free fluid. Hepatobiliary: The liver is unremarkable. There is diffuse dilatation of the intrahepatic biliary trees as well as diffuse dilatation of the common  bile duct. The common bile duct measures up to 2.8 cm. This findings are new since the study of 04/20/2017. Pancreas: There is an ill-defined hypoenhancing soft tissue in the mid medial uncinate process of the pancreas measuring approximately 3.5 x 3.2 cm. There is abutment, and possible complete encasement, of the SMA with loss of the fat plane. Findings highly suspicious for primary pancreatic malignancy such as adenocarcinoma. Further evaluation with MRI, pancreatic for local/MRCP recommended. There is no dilatation of the pancreatic duct. Spleen: Normal in size without focal abnormality. Adrenals/Urinary Tract: The adrenal glands are unremarkable. There is a 3 cm left renal inferior pole cyst. Subcentimeter right renal inferior pole hypodensity is too small to characterize. An area of hypodensity in the inferior pole of the right kidney posteriorly noted which also likely represents a cyst. There is no hydronephrosis on either  side. The urinary bladder is grossly unremarkable but suboptimally visualized due to streak artifact caused by right hip arthroplasty. Stomach/Bowel: There is a large hiatal hernia. There is no bowel obstruction or active inflammation. There is moderate amount of stool within the colon. Appendectomy. Vascular/Lymphatic: There is advanced aortoiliac atherosclerotic disease. There is a 2.4 cm ectasia of infrarenal abdominal aorta. No portal venous gas identified. No adenopathy. Reproductive: The uterus is poorly visualized. Other: None Musculoskeletal: Osteopenia with extensive degenerative changes of the spine. Multilevel disc desiccation and vacuum phenomena. Total right hip arthroplasty. No acute osseous pathology. IMPRESSION: 1. Ill-defined hypoenhancing lesion in the uncinate process of the pancreas highly suspicious for pancreatic adenocarcinoma. Further evaluation with MRI/MRCP recommended. 2. Dilatation of the intrahepatic and intrahepatic biliary trees, not seen on the prior chest  CT of 04/20/2017. 3. Apparent abutment or possibly encasement of the SMA by the pancreatic mass. 4. Large colonic stool burden. No bowel obstruction or active inflammation. 5. A 2.4 cm infrarenal aortic ectasia. 6.  Aortic Atherosclerosis (ICD10-I70.0). Electronically Signed   By: Anner Crete M.D.   On: 06/25/2017 22:26   Mr 3d Recon At Scanner  Result Date: 06/26/2017 CLINICAL DATA:  Painless jaundice with weight loss EXAM: MRI ABDOMEN WITHOUT AND WITH CONTRAST (INCLUDING MRCP) TECHNIQUE: Multiplanar multisequence MR imaging of the abdomen was performed both before and after the administration of intravenous contrast. Heavily T2-weighted images of the biliary and pancreatic ducts were obtained, and three-dimensional MRCP images were rendered by post processing. CONTRAST:  14 cc MultiHance COMPARISON:  06/25/2017 FINDINGS: Despite efforts by the technologist and patient, motion artifact is present on today's exam and could not be eliminated. This reduces exam sensitivity and specificity. Lower chest: Large hiatal hernia.  Small left pleural effusion. Hepatobiliary: Severe intrahepatic and extrahepatic biliary dilatation extending down to the pancreatic mass. Truncation of the CBD in the vicinity of the mass. No focal hepatic metastatic lesions are identified. Pancreas: Abnormal low T1 signal and hypoenhancing mass in the head and uncinate process of the pancreas, highly indistinct due to the motion artifact and the infiltrative nature of the lesion, potentially up to 4.2 by 3.1 cm, with the mass at least partially encasing the superior mesenteric artery. Anatomic detail is much better on the CT scan due to the lack of motion artifact and greater spatial resolution. The proper hepatic artery appears to arise from the celiac trunk-common hepatic artery as is conventional. The mass severely narrows the portal vein, nearly occluding it. , near the confluence of the splenic vein and SMV. The mass also abuts the  left anterolateral margin of the abdominal aorta and of course the duodenum. Surprising lack of dorsal pancreatic duct dilatation. Low-grade peripancreatic edema. Spleen:  Unremarkable, the splenic vein currently remains patent. Adrenals/Urinary Tract: Left kidney lower pole Bosniak category 2 cyst, with a small rim of high precontrast T1 signal on image 61/1300. Minimal scarring in the right kidney lower pole. Stomach/Bowel: Prominent stool throughout the colon favors constipation. Vascular/Lymphatic: Aortoiliac atherosclerotic vascular disease. The relationship of the pancreatic mass with local vessels is ascribed above. No obvious separate adenopathy is appreciable Other:  No supplemental non-categorized findings. Musculoskeletal: Lumbar scoliosis, spondylosis, and degenerative disc disease. Old thoracic compression fractures. Right hip implant. IMPRESSION: 1. Infiltrative mass compatible with adenocarcinoma involving the pancreatic head and uncinate process, abutting significant portions of the superior mesenteric artery and abdominal aorta, and compressing but not totally occluding the confluence of the splenic vein and SMV. Anatomic detail is generally better on  the recent CT scan due to the lack of motion artifact and better spatial resolution. Faint peripancreatic edema could be from pancreatitis but the dorsal pancreatic duct is not dilated. 2. Prominent biliary dilatation due to malignant obstruction by the pancreatic mass. I do not see discrete hepatic metastatic lesions. 3. Large hiatal hernia. 4. Small pleural effusion. 5. Bosniak category 2 benign left renal cyst. Minimal scarring in the right kidney lower pole. 6.  Prominent stool throughout the colon favors constipation. 7.  Aortic Atherosclerosis (ICD10-I70.0). 8. Lumbar spondylosis, scoliosis, and degenerative disc disease. Thoracic compression fractures. Electronically Signed   By: Van Clines M.D.   On: 06/26/2017 17:55   Mr Abdomen Mrcp  Moise Boring Contast  Result Date: 06/26/2017 CLINICAL DATA:  Painless jaundice with weight loss EXAM: MRI ABDOMEN WITHOUT AND WITH CONTRAST (INCLUDING MRCP) TECHNIQUE: Multiplanar multisequence MR imaging of the abdomen was performed both before and after the administration of intravenous contrast. Heavily T2-weighted images of the biliary and pancreatic ducts were obtained, and three-dimensional MRCP images were rendered by post processing. CONTRAST:  14 cc MultiHance COMPARISON:  06/25/2017 FINDINGS: Despite efforts by the technologist and patient, motion artifact is present on today's exam and could not be eliminated. This reduces exam sensitivity and specificity. Lower chest: Large hiatal hernia.  Small left pleural effusion. Hepatobiliary: Severe intrahepatic and extrahepatic biliary dilatation extending down to the pancreatic mass. Truncation of the CBD in the vicinity of the mass. No focal hepatic metastatic lesions are identified. Pancreas: Abnormal low T1 signal and hypoenhancing mass in the head and uncinate process of the pancreas, highly indistinct due to the motion artifact and the infiltrative nature of the lesion, potentially up to 4.2 by 3.1 cm, with the mass at least partially encasing the superior mesenteric artery. Anatomic detail is much better on the CT scan due to the lack of motion artifact and greater spatial resolution. The proper hepatic artery appears to arise from the celiac trunk-common hepatic artery as is conventional. The mass severely narrows the portal vein, nearly occluding it. , near the confluence of the splenic vein and SMV. The mass also abuts the left anterolateral margin of the abdominal aorta and of course the duodenum. Surprising lack of dorsal pancreatic duct dilatation. Low-grade peripancreatic edema. Spleen:  Unremarkable, the splenic vein currently remains patent. Adrenals/Urinary Tract: Left kidney lower pole Bosniak category 2 cyst, with a small rim of high precontrast T1  signal on image 61/1300. Minimal scarring in the right kidney lower pole. Stomach/Bowel: Prominent stool throughout the colon favors constipation. Vascular/Lymphatic: Aortoiliac atherosclerotic vascular disease. The relationship of the pancreatic mass with local vessels is ascribed above. No obvious separate adenopathy is appreciable Other:  No supplemental non-categorized findings. Musculoskeletal: Lumbar scoliosis, spondylosis, and degenerative disc disease. Old thoracic compression fractures. Right hip implant. IMPRESSION: 1. Infiltrative mass compatible with adenocarcinoma involving the pancreatic head and uncinate process, abutting significant portions of the superior mesenteric artery and abdominal aorta, and compressing but not totally occluding the confluence of the splenic vein and SMV. Anatomic detail is generally better on the recent CT scan due to the lack of motion artifact and better spatial resolution. Faint peripancreatic edema could be from pancreatitis but the dorsal pancreatic duct is not dilated. 2. Prominent biliary dilatation due to malignant obstruction by the pancreatic mass. I do not see discrete hepatic metastatic lesions. 3. Large hiatal hernia. 4. Small pleural effusion. 5. Bosniak category 2 benign left renal cyst. Minimal scarring in the right kidney lower pole. 6.  Prominent stool throughout the colon favors constipation. 7.  Aortic Atherosclerosis (ICD10-I70.0). 8. Lumbar spondylosis, scoliosis, and degenerative disc disease. Thoracic compression fractures. Electronically Signed   By: Van Clines M.D.   On: 06/26/2017 17:55    Impression/Plan: Painless jaundice with biliary obstruction from a pancreatic mass - needs an ERCP with metal stent placement. Needs an oncology consult on Monday to see if there are any palliative options for her. Low fat diet. Supportive care. ERCP planned for Monday 06/28/17. Risks/benefits of ERCP discussed with patient and she agrees to  proceed.    LOS: 1 day   [place] C.  06/26/2017, 6:45 PM  Pager [phone]  AFTER 5 pm or on weekends please call [phone]

## 2017-06-26 NOTE — Progress Notes (Signed)
PROGRESS NOTE  Carol Cook BVQ:[id number] DOB: Mar 22, 1927 DOA: 06/25/2017 PCP: Jonathon Jordan, MD   LOS: 1 day   Brief Narrative / Interim history: 83 year old highly functional female with history of lung cancer status post radiation therapy, COPD, who presents to the hospital with worsening jaundice as well as loss of appetite nausea and decreased p.o. intake.  Assessment & Plan: Principal Problem:   Obstructive jaundice due to malignant neoplasm [facility]) Active Problems:   COPD (chronic obstructive pulmonary disease) (HCC)   Non-small cell cancer of left lung (HCC)   Post-radiation pneumonitis (HCC)   Painless jaundice -CT scan on admission concerning for a mass in the pancreas highly suspicion for pancreatic adenocarcinoma with dilatation of the intrahepatic biliary trees.  There is also mention about apparent encasement of the SMA by the pancreatic mass. -Gastroenterology was consulted, discussed with Dr. Michail Sermon, he recommended an MRI/MRCP which has been ordered. -Patient seems to be tolerating clear liquids, keep on that until she is fully evaluated by GI  Lung carcinoma -She is status post stereotactic radiation therapy as well as concern for radiation pneumonitis -Outpatient management, does not seem to be an active issue  COPD -No wheezing, continue home medications   DVT prophylaxis: SCDs Code Status: Full code Family Communication: Discussed with son and daughter at bedside Disposition Plan: Home when ready, awaiting GI evaluation MRCP  Consultants:   Gastroenterology  Procedures:   None   Antimicrobials:  None    Subjective: - no chest pain, shortness of breath, no abdominal pain.  Complains of nausea and loss of appetite, no vomiting  Objective: Vitals:   06/26/17 0049 06/26/17 0503 06/26/17 0948 06/26/17 1300  BP: (!) 111/94 124/63 139/64 (!) 144/82  Pulse: 72 68 65 79  Resp: 20 20 16 20   Temp: 97.7 F (36.5 C) 98.2 F (36.8 C) 98.1 F (36.7  C)   TempSrc: Oral Oral Oral Oral  SpO2: 100% 97% 94% 97%  Weight: 74.8 kg (164 lb 14.5 oz)     Height: 5\' 2"  (1.575 m)       Intake/Output Summary (Last 24 hours) at 06/26/17 1302 Last data filed at 06/26/17 1300  Gross per 24 hour  Intake              360 ml  Output                0 ml  Net              360 ml   Filed Weights   06/25/17 1747 06/26/17 0049  Weight: 74.8 kg (165 lb) 74.8 kg (164 lb 14.5 oz)    Examination:  Constitutional: NAD Eyes: PERRL, lids and conjunctivae icteric ENMT: Mucous membranes are moist. Neck: normal, supple Respiratory: clear to auscultation bilaterally, no wheezing, no crackles.  Cardiovascular: Regular rate and rhythm, no murmurs / rubs / gallops. No LE edema.  Abdomen: no tenderness. Bowel sounds positive.  Skin: Appears slightly icteric Neurologic: CN 2-12 grossly intact. Strength 5/5 in all 4.  Psychiatric: Normal judgment and insight. Alert and oriented x 3. Normal mood.    Data Reviewed: I have independently reviewed following labs and imaging studies   CBC:  Recent Labs Lab 06/25/17 1747 06/26/17 0245  WBC 4.9 4.2  NEUTROABS 3.3  --   HGB 11.0* 10.0*  HCT 31.9* 28.6*  MCV 88.9 89.9  PLT 228 800   Basic Metabolic Panel:  Recent Labs Lab 06/25/17 1747 06/26/17 0245  NA 133* 134*  K  4.1 3.3*  CL 101 103  CO2 23 24  GLUCOSE 108* 128*  BUN 9 5*  CREATININE 0.66 0.60  CALCIUM 10.1 9.5   GFR: Estimated Creatinine Clearance: 46 mL/min (by C-G formula based on SCr of 0.6 mg/dL). Liver Function Tests:  Recent Labs Lab 06/25/17 1747 06/26/17 0245  AST 393* 361*  ALT 345* 321*  ALKPHOS 634* 564*  BILITOT 10.7* 10.3*  PROT 6.7 5.6*  ALBUMIN 3.6 3.0*    Recent Labs Lab 06/25/17 1747  LIPASE 47   No results for input(s): AMMONIA in the last 168 hours. Coagulation Profile: No results for input(s): INR, PROTIME in the last 168 hours. Cardiac Enzymes: No results for input(s): CKTOTAL, CKMB, CKMBINDEX,  TROPONINI in the last 168 hours. BNP (last 3 results) No results for input(s): PROBNP in the last 8760 hours. HbA1C: No results for input(s): HGBA1C in the last 72 hours. CBG: No results for input(s): GLUCAP in the last 168 hours. Lipid Profile: No results for input(s): CHOL, HDL, LDLCALC, TRIG, CHOLHDL, LDLDIRECT in the last 72 hours. Thyroid Function Tests: No results for input(s): TSH, T4TOTAL, FREET4, T3FREE, THYROIDAB in the last 72 hours. Anemia Panel: No results for input(s): VITAMINB12, FOLATE, FERRITIN, TIBC, IRON, RETICCTPCT in the last 72 hours. Urine analysis:    Component Value Date/Time   COLORURINE AMBER (A) 06/25/2017 2304   APPEARANCEUR CLEAR 06/25/2017 2304   LABSPEC 1.020 06/25/2017 2304   PHURINE 6.0 06/25/2017 2304   GLUCOSEU NEGATIVE 06/25/2017 2304   HGBUR NEGATIVE 06/25/2017 2304   BILIRUBINUR SMALL (A) 06/25/2017 2304   KETONESUR 5 (A) 06/25/2017 2304   PROTEINUR NEGATIVE 06/25/2017 2304   UROBILINOGEN >=8.0 06/25/2017 1636   NITRITE NEGATIVE 06/25/2017 2304   LEUKOCYTESUR NEGATIVE 06/25/2017 2304   Sepsis Labs: Invalid input(s): PROCALCITONIN, LACTICIDVEN  No results found for this or any previous visit (from the past 240 hour(s)).    Radiology Studies: Ct Abdomen Pelvis W Contrast  Result Date: 06/25/2017 CLINICAL DATA:  83 year old female with painless jaundice and weight loss and fatigue. History of left lower lobe lung adenocarcinoma status post radiation treatment in September 2016. EXAM: CT ABDOMEN AND PELVIS WITH CONTRAST TECHNIQUE: Multidetector CT imaging of the abdomen and pelvis was performed using the standard protocol following bolus administration of intravenous contrast. CONTRAST:  ISOVUE-300 IOPAMIDOL (ISOVUE-300) INJECTION 61% COMPARISON:  Chest CT dated 04/20/2017 and 04/20/2016 FINDINGS: Evaluation is limited due to streak artifact caused by patient's arms and metallic right hip arthroplasty. Lower chest: There is subpleural  emphysematous changes of the lung bases posteriorly. Three vessel coronary vascular atherosclerosis. There is no intra-abdominal free air or free fluid. Hepatobiliary: The liver is unremarkable. There is diffuse dilatation of the intrahepatic biliary trees as well as diffuse dilatation of the common bile duct. The common bile duct measures up to 2.8 cm. This findings are new since the study of 04/20/2017. Pancreas: There is an ill-defined hypoenhancing soft tissue in the mid medial uncinate process of the pancreas measuring approximately 3.5 x 3.2 cm. There is abutment, and possible complete encasement, of the SMA with loss of the fat plane. Findings highly suspicious for primary pancreatic malignancy such as adenocarcinoma. Further evaluation with MRI, pancreatic for local/MRCP recommended. There is no dilatation of the pancreatic duct. Spleen: Normal in size without focal abnormality. Adrenals/Urinary Tract: The adrenal glands are unremarkable. There is a 3 cm left renal inferior pole cyst. Subcentimeter right renal inferior pole hypodensity is too small to characterize. An area of hypodensity in the inferior  pole of the right kidney posteriorly noted which also likely represents a cyst. There is no hydronephrosis on either side. The urinary bladder is grossly unremarkable but suboptimally visualized due to streak artifact caused by right hip arthroplasty. Stomach/Bowel: There is a large hiatal hernia. There is no bowel obstruction or active inflammation. There is moderate amount of stool within the colon. Appendectomy. Vascular/Lymphatic: There is advanced aortoiliac atherosclerotic disease. There is a 2.4 cm ectasia of infrarenal abdominal aorta. No portal venous gas identified. No adenopathy. Reproductive: The uterus is poorly visualized. Other: None Musculoskeletal: Osteopenia with extensive degenerative changes of the spine. Multilevel disc desiccation and vacuum phenomena. Total right hip arthroplasty. No  acute osseous pathology. IMPRESSION: 1. Ill-defined hypoenhancing lesion in the uncinate process of the pancreas highly suspicious for pancreatic adenocarcinoma. Further evaluation with MRI/MRCP recommended. 2. Dilatation of the intrahepatic and intrahepatic biliary trees, not seen on the prior chest CT of 04/20/2017. 3. Apparent abutment or possibly encasement of the SMA by the pancreatic mass. 4. Large colonic stool burden. No bowel obstruction or active inflammation. 5. A 2.4 cm infrarenal aortic ectasia. 6.  Aortic Atherosclerosis (ICD10-I70.0). Electronically Signed   By: Anner Crete M.D.   On: 06/25/2017 22:26     Scheduled Meds: . arformoterol  15 mcg Nebulization BID  . cholecalciferol  1,000 Units Oral Daily  . feeding supplement  1 Container Oral TID BM  . pantoprazole  40 mg Oral Daily  . [START ON 06/28/2017] prednisoLONE acetate  1 drop Right Eye Q M,W,F  . sucralfate  1 g Oral TID WC & HS  . umeclidinium bromide  1 puff Inhalation Daily   Continuous Infusions:  Marzetta Board, MD, PhD [facility] Pager [phone]  If 7PM-7AM, please contact night-coverage [email or web address] Password [email or web address] 06/26/2017, 1:02 PM

## 2017-06-26 NOTE — Progress Notes (Signed)
Initial Nutrition Assessment  DOCUMENTATION CODES:   Severe malnutrition in context of chronic illness  INTERVENTION:  1. Boost Breeze po TID, each supplement provides 250 kcal and 9 grams of protein 2. MVI w/ Minerals  NUTRITION DIAGNOSIS:   Severe Malnutrition related to chronic illness as evidenced by severe muscle depletion, severe fat depletion.  GOAL:   Patient will meet greater than or equal to 90% of their needs  MONITOR:   PO intake, I & O's, Labs, Supplement acceptance, Weight trends  REASON FOR ASSESSMENT:   Malnutrition Screening Tool    ASSESSMENT:   Carol Cook is a 83 y.o. female with medical history significant of NSCLC in 2017 s/p radiation, COPD.  Patient presents to the ED today after daughter noticed she looked Jaundiced today.    Spoke to Ms. Garza at bedside. She reports her appetite being so-so. Normally eats 2 meals a day, but they are generally "a few small bites of something." Complains of getting full quickly. Likely not meeting needs PTA. Reports intentional weight loss down from 196 pounds in Jan 2017 to 165 pounds now. Says she has been losing weight slowly. This appears accurate per chart. Denies any nausea/vomiting. Complaining about clear liquid diet. Wants solid food, but on clears until GI sees her per MD.   Encouraged her to continue to drink boost breeze offered to her on clear liquid tray. Will provide ensure with diet advancement. Encouraged her to drink as an outpatient to increase protein intake.  Labs reviewed:  K 3.3, Na 134, AST 361, ALT 321,  Medications reviewed and include:  Vitamin D    NUTRITION - FOCUSED PHYSICAL EXAM:    Most Recent Value  Orbital Region  Severe depletion  Upper Arm Region  Severe depletion  Thoracic and Lumbar Region  Severe depletion  Buccal Region  Severe depletion  Temple Region  Severe depletion  Clavicle Bone Region  Severe depletion  Clavicle and Acromion Bone Region  Severe depletion   Scapular Bone Region  Severe depletion  Dorsal Hand  Severe depletion  Patellar Region  Severe depletion  Anterior Thigh Region  Severe depletion  Posterior Calf Region  Severe depletion  Edema (RD Assessment)  None  Hair  Reviewed  Eyes  Reviewed  Mouth  Reviewed  Skin  Reviewed  Nails  Reviewed       Diet Order:  Diet clear liquid Room service appropriate? Yes; Fluid consistency: Thin  EDUCATION NEEDS:   Education needs have been addressed  Skin:  Skin Assessment: Reviewed RN Assessment  Last BM:  PTA  Height:   Ht Readings from Last 1 Encounters:  06/26/17 5\' 2"  (1.575 m)    Weight:   Wt Readings from Last 1 Encounters:  06/26/17 164 lb 14.5 oz (74.8 kg)    Ideal Body Weight:  50 kg  BMI:  Body mass index is 30.16 kg/m.  Estimated Nutritional Needs:   Kcal:  1700-2000 calories  Protein:  97-127 grams (1.3-1.7g/kg)  Fluid:  1.7-2L   Satira Anis. Tereka Thorley, MS, RD LDN Inpatient Clinical Dietitian Pager [phone]

## 2017-06-26 NOTE — ED Notes (Signed)
Dr. Alcario Drought aware pt's last bowel movement was on Tuesday, will order PRN stool softener.

## 2017-06-27 ENCOUNTER — Other Ambulatory Visit: Payer: Self-pay

## 2017-06-27 LAB — COMPREHENSIVE METABOLIC PANEL
ALT: 361 U/L — AB (ref 14–54)
AST: 382 U/L — AB (ref 15–41)
Albumin: 2.8 g/dL — ABNORMAL LOW (ref 3.5–5.0)
Alkaline Phosphatase: 625 U/L — ABNORMAL HIGH (ref 38–126)
Anion gap: 8 (ref 5–15)
BILIRUBIN TOTAL: 10.6 mg/dL — AB (ref 0.3–1.2)
CHLORIDE: 100 mmol/L — AB (ref 101–111)
CO2: 27 mmol/L (ref 22–32)
CREATININE: 0.64 mg/dL (ref 0.44–1.00)
Calcium: 9.6 mg/dL (ref 8.9–10.3)
Glucose, Bld: 95 mg/dL (ref 65–99)
Potassium: 3.2 mmol/L — ABNORMAL LOW (ref 3.5–5.1)
Sodium: 135 mmol/L (ref 135–145)
TOTAL PROTEIN: 5.6 g/dL — AB (ref 6.5–8.1)

## 2017-06-27 LAB — CBC
HCT: 28.7 % — ABNORMAL LOW (ref 36.0–46.0)
Hemoglobin: 9.9 g/dL — ABNORMAL LOW (ref 12.0–15.0)
MCH: 30.8 pg (ref 26.0–34.0)
MCHC: 34.5 g/dL (ref 30.0–36.0)
MCV: 89.4 fL (ref 78.0–100.0)
PLATELETS: 218 10*3/uL (ref 150–400)
RBC: 3.21 MIL/uL — AB (ref 3.87–5.11)
RDW: 14.8 % (ref 11.5–15.5)
WBC: 4.3 10*3/uL (ref 4.0–10.5)

## 2017-06-27 LAB — PROTIME-INR
INR: 0.96
Prothrombin Time: 12.6 seconds (ref 11.4–15.2)

## 2017-06-27 LAB — CANCER ANTIGEN 19-9: CA 19-9: 462 U/mL — ABNORMAL HIGH (ref 0–35)

## 2017-06-27 MED ORDER — TRAZODONE HCL 50 MG PO TABS
25.0000 mg | ORAL_TABLET | Freq: Every evening | ORAL | Status: DC | PRN
  Administered 2017-06-27: 25 mg via ORAL
  Filled 2017-06-27: qty 1

## 2017-06-27 NOTE — Progress Notes (Addendum)
PROGRESS NOTE   Carol Cook AYT:[id number] DOB: May 31, 1927 DOA: 06/25/2017 PCP: Jonathon Jordan, MD   LOS: 2 days   Brief Narrative / Interim history: 83 year old highly functional female with history of lung cancer status post radiation therapy, COPD, who presents to the hospital with worsening jaundice as well as loss of appetite nausea and decreased p.o. intake.  Assessment & Plan: Principal Problem:   Obstructive jaundice due to malignant neoplasm [facility]) Active Problems:   COPD (chronic obstructive pulmonary disease) (HCC)   Non-small cell cancer of left lung (HCC)   Post-radiation pneumonitis (HCC)   Painless jaundice -CT scan on admission concerning for a mass in the pancreas highly suspicion for pancreatic adenocarcinoma with dilatation of the intrahepatic biliary trees.  There is also mention about apparent encasement of the SMA by the pancreatic mass. -Gastroenterology was consulted, d/w Dr. Michail Sermon today, plan for ERCP tomorrow morning. -High likelihood of pancreatic malignancy, will consult oncology in the morning to see whether she would be a candidate for chemotherapy to begin with  Lung carcinoma -She is status post stereotactic radiation therapy as well as concern for radiation pneumonitis -Outpatient management, does not seem to be an active issue  COPD -No wheezing, continue home medications   DVT prophylaxis: SCDs Code Status: Full code Family Communication: No family at bedside this morning Disposition Plan: Plan for ERCP tomorrow, home probable Tuesday  Consultants:   Gastroenterology  Procedures:   None   Antimicrobials:  None    Subjective: -Has not been able to sleep well overnight, thinks is because of the bed.  Complains of nausea, poor appetite.  No vomiting.  No abdominal pain.  Objective: Vitals:   06/27/17 0053 06/27/17 0631 06/27/17 0811 06/27/17 0947  BP: (!) 153/62 135/72  131/63  Pulse: 65 74  67  Resp: 18 18  16   Temp:  (!)  96.6 F (35.9 C)  98.2 F (36.8 C)  TempSrc:  Axillary  Oral  SpO2: 96% 92% 94% 97%  Weight:      Height:        Intake/Output Summary (Last 24 hours) at 06/27/2017 1233 Last data filed at 06/27/2017 0947 Gross per 24 hour  Intake 420 ml  Output -  Net 420 ml   Filed Weights   06/25/17 1747 06/26/17 0049  Weight: 74.8 kg (165 lb) 74.8 kg (164 lb 14.5 oz)    Examination:  Constitutional: NAD, calm, comfortable, eating breakfast Eyes: PERRL, lids and conjunctivae icteric ENMT: Mucous membranes are moist.  Neck: normal, supple Respiratory: clear to auscultation bilaterally, no wheezing, no crackles. Normal respiratory effort.  Cardiovascular: Regular rate and rhythm, no murmurs / rubs / gallops. No LE edema. Abdomen: no tenderness. Bowel sounds positive.  Skin: no rashes, lesions, ulcers. No induration Neurologic: non focal   Data Reviewed: I have independently reviewed following labs and imaging studies   CBC: Recent Labs  Lab 06/25/17 1747 06/26/17 0245 06/27/17 0406  WBC 4.9 4.2 4.3  NEUTROABS 3.3  --   --   HGB 11.0* 10.0* 9.9*  HCT 31.9* 28.6* 28.7*  MCV 88.9 89.9 89.4  PLT 228 207 355   Basic Metabolic Panel: Recent Labs  Lab 06/25/17 1747 06/26/17 0245 06/27/17 0406  NA 133* 134* 135  K 4.1 3.3* 3.2*  CL 101 103 100*  CO2 23 24 27   GLUCOSE 108* 128* 95  BUN 9 5* <5*  CREATININE 0.66 0.60 0.64  CALCIUM 10.1 9.5 9.6   GFR: Estimated Creatinine Clearance: 46  mL/min (by C-G formula based on SCr of 0.64 mg/dL). Liver Function Tests: Recent Labs  Lab 06/25/17 1747 06/26/17 0245 06/27/17 0406  AST 393* 361* 382*  ALT 345* 321* 361*  ALKPHOS 634* 564* 625*  BILITOT 10.7* 10.3* 10.6*  PROT 6.7 5.6* 5.6*  ALBUMIN 3.6 3.0* 2.8*   Recent Labs  Lab 06/25/17 1747  LIPASE 47   No results for input(s): AMMONIA in the last 168 hours. Coagulation Profile: Recent Labs  Lab 06/27/17 0406  INR 0.96   Cardiac Enzymes: No results for input(s):  CKTOTAL, CKMB, CKMBINDEX, TROPONINI in the last 168 hours. BNP (last 3 results) No results for input(s): PROBNP in the last 8760 hours. HbA1C: No results for input(s): HGBA1C in the last 72 hours. CBG: No results for input(s): GLUCAP in the last 168 hours. Lipid Profile: No results for input(s): CHOL, HDL, LDLCALC, TRIG, CHOLHDL, LDLDIRECT in the last 72 hours. Thyroid Function Tests: No results for input(s): TSH, T4TOTAL, FREET4, T3FREE, THYROIDAB in the last 72 hours. Anemia Panel: No results for input(s): VITAMINB12, FOLATE, FERRITIN, TIBC, IRON, RETICCTPCT in the last 72 hours. Urine analysis:    Component Value Date/Time   COLORURINE AMBER (A) 06/25/2017 2304   APPEARANCEUR CLEAR 06/25/2017 2304   LABSPEC 1.020 06/25/2017 2304   PHURINE 6.0 06/25/2017 2304   GLUCOSEU NEGATIVE 06/25/2017 2304   HGBUR NEGATIVE 06/25/2017 2304   BILIRUBINUR SMALL (A) 06/25/2017 2304   KETONESUR 5 (A) 06/25/2017 2304   PROTEINUR NEGATIVE 06/25/2017 2304   UROBILINOGEN >=8.0 06/25/2017 1636   NITRITE NEGATIVE 06/25/2017 2304   LEUKOCYTESUR NEGATIVE 06/25/2017 2304   Sepsis Labs: Invalid input(s): PROCALCITONIN, LACTICIDVEN  No results found for this or any previous visit (from the past 240 hour(s)).    Radiology Studies: Ct Abdomen Pelvis W Contrast  Result Date: 06/25/2017 CLINICAL DATA:  83 year old female with painless jaundice and weight loss and fatigue. History of left lower lobe lung adenocarcinoma status post radiation treatment in September 2016. EXAM: CT ABDOMEN AND PELVIS WITH CONTRAST TECHNIQUE: Multidetector CT imaging of the abdomen and pelvis was performed using the standard protocol following bolus administration of intravenous contrast. CONTRAST:  ISOVUE-300 IOPAMIDOL (ISOVUE-300) INJECTION 61% COMPARISON:  Chest CT dated 04/20/2017 and 04/20/2016 FINDINGS: Evaluation is limited due to streak artifact caused by patient's arms and metallic right hip arthroplasty. Lower chest:  There is subpleural emphysematous changes of the lung bases posteriorly. Three vessel coronary vascular atherosclerosis. There is no intra-abdominal free air or free fluid. Hepatobiliary: The liver is unremarkable. There is diffuse dilatation of the intrahepatic biliary trees as well as diffuse dilatation of the common bile duct. The common bile duct measures up to 2.8 cm. This findings are new since the study of 04/20/2017. Pancreas: There is an ill-defined hypoenhancing soft tissue in the mid medial uncinate process of the pancreas measuring approximately 3.5 x 3.2 cm. There is abutment, and possible complete encasement, of the SMA with loss of the fat plane. Findings highly suspicious for primary pancreatic malignancy such as adenocarcinoma. Further evaluation with MRI, pancreatic for local/MRCP recommended. There is no dilatation of the pancreatic duct. Spleen: Normal in size without focal abnormality. Adrenals/Urinary Tract: The adrenal glands are unremarkable. There is a 3 cm left renal inferior pole cyst. Subcentimeter right renal inferior pole hypodensity is too small to characterize. An area of hypodensity in the inferior pole of the right kidney posteriorly noted which also likely represents a cyst. There is no hydronephrosis on either side. The urinary bladder is grossly  unremarkable but suboptimally visualized due to streak artifact caused by right hip arthroplasty. Stomach/Bowel: There is a large hiatal hernia. There is no bowel obstruction or active inflammation. There is moderate amount of stool within the colon. Appendectomy. Vascular/Lymphatic: There is advanced aortoiliac atherosclerotic disease. There is a 2.4 cm ectasia of infrarenal abdominal aorta. No portal venous gas identified. No adenopathy. Reproductive: The uterus is poorly visualized. Other: None Musculoskeletal: Osteopenia with extensive degenerative changes of the spine. Multilevel disc desiccation and vacuum phenomena. Total right hip  arthroplasty. No acute osseous pathology. IMPRESSION: 1. Ill-defined hypoenhancing lesion in the uncinate process of the pancreas highly suspicious for pancreatic adenocarcinoma. Further evaluation with MRI/MRCP recommended. 2. Dilatation of the intrahepatic and intrahepatic biliary trees, not seen on the prior chest CT of 04/20/2017. 3. Apparent abutment or possibly encasement of the SMA by the pancreatic mass. 4. Large colonic stool burden. No bowel obstruction or active inflammation. 5. A 2.4 cm infrarenal aortic ectasia. 6.  Aortic Atherosclerosis (ICD10-I70.0). Electronically Signed   By: Anner Crete M.D.   On: 06/25/2017 22:26   Mr 3d Recon At Scanner  Result Date: 06/26/2017 CLINICAL DATA:  Painless jaundice with weight loss EXAM: MRI ABDOMEN WITHOUT AND WITH CONTRAST (INCLUDING MRCP) TECHNIQUE: Multiplanar multisequence MR imaging of the abdomen was performed both before and after the administration of intravenous contrast. Heavily T2-weighted images of the biliary and pancreatic ducts were obtained, and three-dimensional MRCP images were rendered by post processing. CONTRAST:  14 cc MultiHance COMPARISON:  06/25/2017 FINDINGS: Despite efforts by the technologist and patient, motion artifact is present on today's exam and could not be eliminated. This reduces exam sensitivity and specificity. Lower chest: Large hiatal hernia.  Small left pleural effusion. Hepatobiliary: Severe intrahepatic and extrahepatic biliary dilatation extending down to the pancreatic mass. Truncation of the CBD in the vicinity of the mass. No focal hepatic metastatic lesions are identified. Pancreas: Abnormal low T1 signal and hypoenhancing mass in the head and uncinate process of the pancreas, highly indistinct due to the motion artifact and the infiltrative nature of the lesion, potentially up to 4.2 by 3.1 cm, with the mass at least partially encasing the superior mesenteric artery. Anatomic detail is much better on the CT  scan due to the lack of motion artifact and greater spatial resolution. The proper hepatic artery appears to arise from the celiac trunk-common hepatic artery as is conventional. The mass severely narrows the portal vein, nearly occluding it. , near the confluence of the splenic vein and SMV. The mass also abuts the left anterolateral margin of the abdominal aorta and of course the duodenum. Surprising lack of dorsal pancreatic duct dilatation. Low-grade peripancreatic edema. Spleen:  Unremarkable, the splenic vein currently remains patent. Adrenals/Urinary Tract: Left kidney lower pole Bosniak category 2 cyst, with a small rim of high precontrast T1 signal on image 61/1300. Minimal scarring in the right kidney lower pole. Stomach/Bowel: Prominent stool throughout the colon favors constipation. Vascular/Lymphatic: Aortoiliac atherosclerotic vascular disease. The relationship of the pancreatic mass with local vessels is ascribed above. No obvious separate adenopathy is appreciable Other:  No supplemental non-categorized findings. Musculoskeletal: Lumbar scoliosis, spondylosis, and degenerative disc disease. Old thoracic compression fractures. Right hip implant. IMPRESSION: 1. Infiltrative mass compatible with adenocarcinoma involving the pancreatic head and uncinate process, abutting significant portions of the superior mesenteric artery and abdominal aorta, and compressing but not totally occluding the confluence of the splenic vein and SMV. Anatomic detail is generally better on the recent CT scan due to  the lack of motion artifact and better spatial resolution. Faint peripancreatic edema could be from pancreatitis but the dorsal pancreatic duct is not dilated. 2. Prominent biliary dilatation due to malignant obstruction by the pancreatic mass. I do not see discrete hepatic metastatic lesions. 3. Large hiatal hernia. 4. Small pleural effusion. 5. Bosniak category 2 benign left renal cyst. Minimal scarring in the  right kidney lower pole. 6.  Prominent stool throughout the colon favors constipation. 7.  Aortic Atherosclerosis (ICD10-I70.0). 8. Lumbar spondylosis, scoliosis, and degenerative disc disease. Thoracic compression fractures. Electronically Signed   By: Van Clines M.D.   On: 06/26/2017 17:55   Mr Abdomen Mrcp Moise Boring Contast  Result Date: 06/26/2017 CLINICAL DATA:  Painless jaundice with weight loss EXAM: MRI ABDOMEN WITHOUT AND WITH CONTRAST (INCLUDING MRCP) TECHNIQUE: Multiplanar multisequence MR imaging of the abdomen was performed both before and after the administration of intravenous contrast. Heavily T2-weighted images of the biliary and pancreatic ducts were obtained, and three-dimensional MRCP images were rendered by post processing. CONTRAST:  14 cc MultiHance COMPARISON:  06/25/2017 FINDINGS: Despite efforts by the technologist and patient, motion artifact is present on today's exam and could not be eliminated. This reduces exam sensitivity and specificity. Lower chest: Large hiatal hernia.  Small left pleural effusion. Hepatobiliary: Severe intrahepatic and extrahepatic biliary dilatation extending down to the pancreatic mass. Truncation of the CBD in the vicinity of the mass. No focal hepatic metastatic lesions are identified. Pancreas: Abnormal low T1 signal and hypoenhancing mass in the head and uncinate process of the pancreas, highly indistinct due to the motion artifact and the infiltrative nature of the lesion, potentially up to 4.2 by 3.1 cm, with the mass at least partially encasing the superior mesenteric artery. Anatomic detail is much better on the CT scan due to the lack of motion artifact and greater spatial resolution. The proper hepatic artery appears to arise from the celiac trunk-common hepatic artery as is conventional. The mass severely narrows the portal vein, nearly occluding it. , near the confluence of the splenic vein and SMV. The mass also abuts the left anterolateral  margin of the abdominal aorta and of course the duodenum. Surprising lack of dorsal pancreatic duct dilatation. Low-grade peripancreatic edema. Spleen:  Unremarkable, the splenic vein currently remains patent. Adrenals/Urinary Tract: Left kidney lower pole Bosniak category 2 cyst, with a small rim of high precontrast T1 signal on image 61/1300. Minimal scarring in the right kidney lower pole. Stomach/Bowel: Prominent stool throughout the colon favors constipation. Vascular/Lymphatic: Aortoiliac atherosclerotic vascular disease. The relationship of the pancreatic mass with local vessels is ascribed above. No obvious separate adenopathy is appreciable Other:  No supplemental non-categorized findings. Musculoskeletal: Lumbar scoliosis, spondylosis, and degenerative disc disease. Old thoracic compression fractures. Right hip implant. IMPRESSION: 1. Infiltrative mass compatible with adenocarcinoma involving the pancreatic head and uncinate process, abutting significant portions of the superior mesenteric artery and abdominal aorta, and compressing but not totally occluding the confluence of the splenic vein and SMV. Anatomic detail is generally better on the recent CT scan due to the lack of motion artifact and better spatial resolution. Faint peripancreatic edema could be from pancreatitis but the dorsal pancreatic duct is not dilated. 2. Prominent biliary dilatation due to malignant obstruction by the pancreatic mass. I do not see discrete hepatic metastatic lesions. 3. Large hiatal hernia. 4. Small pleural effusion. 5. Bosniak category 2 benign left renal cyst. Minimal scarring in the right kidney lower pole. 6.  Prominent stool throughout the colon  favors constipation. 7.  Aortic Atherosclerosis (ICD10-I70.0). 8. Lumbar spondylosis, scoliosis, and degenerative disc disease. Thoracic compression fractures. Electronically Signed   By: Van Clines M.D.   On: 06/26/2017 17:55     Scheduled Meds: .  arformoterol  15 mcg Nebulization BID  . cholecalciferol  1,000 Units Oral Daily  . feeding supplement  1 Container Oral TID BM  . multivitamin  15 mL Oral Daily  . pantoprazole  40 mg Oral BID  . [START ON 06/28/2017] prednisoLONE acetate  1 drop Right Eye Q M,W,F  . umeclidinium bromide  1 puff Inhalation Daily   Continuous Infusions: . sodium chloride 20 mL/hr at 06/26/17 2052  . sodium chloride      Marzetta Board, MD, PhD [facility] Pager [phone]  If 7PM-7AM, please contact night-coverage [email or web address] Password [facility] 06/27/2017, 12:33 PM

## 2017-06-27 NOTE — Progress Notes (Signed)
[facility] Gastroenterology Progress Note  Carol Cook 83 y.o. [birth year]   Subjective: Feels ok. Denies abdominal pain. Tolerated low fat diet. Lying in bedside chair. Did not sleep well.  Objective: Vital signs: Vitals:   06/27/17 0811 06/27/17 0947  BP:  131/63  Pulse:  67  Resp:  16  Temp:  98.2 F (36.8 C)  SpO2: 94% 97%    Physical Exam: Gen: lethargic, elderly, jaundice, no acute distress  HEENT: +scleral icterus CV: RRR Chest: CTA B Abd: soft, nontender, nondistended, +BS   Lab Results: Recent Labs    06/26/17 0245 06/27/17 0406  NA 134* 135  K 3.3* 3.2*  CL 103 100*  CO2 24 27  GLUCOSE 128* 95  BUN 5* <5*  CREATININE 0.60 0.64  CALCIUM 9.5 9.6   Recent Labs    06/26/17 0245 06/27/17 0406  AST 361* 382*  ALT 321* 361*  ALKPHOS 564* 625*  BILITOT 10.3* 10.6*  PROT 5.6* 5.6*  ALBUMIN 3.0* 2.8*   Recent Labs    06/25/17 1747 06/26/17 0245 06/27/17 0406  WBC 4.9 4.2 4.3  NEUTROABS 3.3  --   --   HGB 11.0* 10.0* 9.9*  HCT 31.9* 28.6* 28.7*  MCV 88.9 89.9 89.4  PLT 228 207 218      Assessment/Plan: Obstructive jaundice with pancreatic mass with biliary obstruction. ALP rising. Transaminases no significant change. ERCP planned for tomorrow for metal stent placement and if does well post-ERCP she most likely can go home Tuesday, 06/29/17. Will need f/u with oncology or be seen by them on Monday to see if any palliative role for her pancreatic cancer. Will change to clear liquid diet for dinner and NPO p MN. Dr. Paulita Fujita to evaluate tomorrow and ERCP will be scheduled for tomorrow (time to be determined).   [place] [facility]. 06/27/2017, 10:45 AM  Pager [phone]  AFTER 5 PM or on weekends please call [name], female   DOB: 03-05-1927, 83 y.o.   MRN: [id number]

## 2017-06-28 ENCOUNTER — Encounter (HOSPITAL_COMMUNITY): Payer: Self-pay | Admitting: Certified Registered Nurse Anesthetist

## 2017-06-28 ENCOUNTER — Encounter (HOSPITAL_COMMUNITY)
Admission: EM | Disposition: A | Discharge status: Home / Self Care | Payer: Self-pay | Source: Home / Self Care | Attending: Internal Medicine

## 2017-06-28 DIAGNOSIS — C3492 Malignant neoplasm of unspecified part of left bronchus or lung: Secondary | ICD-10-CM

## 2017-06-28 LAB — CBC
HEMATOCRIT: 26.8 % — AB (ref 36.0–46.0)
Hemoglobin: 9.4 g/dL — ABNORMAL LOW (ref 12.0–15.0)
MCH: 31.2 pg (ref 26.0–34.0)
MCHC: 35.1 g/dL (ref 30.0–36.0)
MCV: 89 fL (ref 78.0–100.0)
PLATELETS: ADEQUATE 10*3/uL (ref 150–400)
RBC: 3.01 MIL/uL — AB (ref 3.87–5.11)
RDW: 15.2 % (ref 11.5–15.5)
WBC: 4.5 10*3/uL (ref 4.0–10.5)

## 2017-06-28 LAB — GLUCOSE, CAPILLARY: Glucose-Capillary: 103 mg/dL — ABNORMAL HIGH (ref 65–99)

## 2017-06-28 LAB — COMPREHENSIVE METABOLIC PANEL
ALT: 329 U/L — AB (ref 14–54)
AST: 306 U/L — AB (ref 15–41)
Albumin: 2.7 g/dL — ABNORMAL LOW (ref 3.5–5.0)
Alkaline Phosphatase: 611 U/L — ABNORMAL HIGH (ref 38–126)
Anion gap: 7 (ref 5–15)
BUN: 5 mg/dL — AB (ref 6–20)
CHLORIDE: 100 mmol/L — AB (ref 101–111)
CO2: 26 mmol/L (ref 22–32)
CREATININE: 0.55 mg/dL (ref 0.44–1.00)
Calcium: 9.5 mg/dL (ref 8.9–10.3)
GFR calc Af Amer: >60 mL/min (ref >60)
GFR calc non Af Amer: >60 mL/min (ref >60)
Glucose, Bld: 109 mg/dL — ABNORMAL HIGH (ref 65–99)
POTASSIUM: 3.2 mmol/L — AB (ref 3.5–5.1)
SODIUM: 133 mmol/L — AB (ref 135–145)
Total Bilirubin: 10 mg/dL — ABNORMAL HIGH (ref 0.3–1.2)
Total Protein: 5.3 g/dL — ABNORMAL LOW (ref 6.5–8.1)

## 2017-06-28 SURGERY — ERCP, WITH INTERVENTION IF INDICATED
Anesthesia: General

## 2017-06-28 NOTE — Discharge Summary (Addendum)
Physician Discharge Summary  Carol Cook YDX:[id number] DOB: 07-Jan-1927 DOA: 06/25/2017  PCP: Jonathon Jordan, MD  Admit date: 06/25/2017 Discharge date: 06/28/2017  Admitted From: home Disposition:  home  Recommendations for Outpatient Follow-up:  1. Follow up at [facility] on Wed 11/7 7:30 am for pre-admission intake, then 7 am on 11/8 for ERCP with Dr Delrae Alfred at [facility]: none Equipment/Devices: none  Discharge Condition: stable CODE STATUS: Full code Diet recommendation: regular  HPI: Per Dr. Alcario Drought, Carol Cook is a 83 y.o. female with medical history significant of NSCLC in 2017 s/p radiation, COPD.  Patient presents to the ED today after daughter noticed she looked Jaundiced today. Patient lives alone.  Does have dark urine.  Denies pain or vomiting, but says she hasnt been eating recently due to nausea. ED Course: Bili 10, AST and ALT in the 300s.  ALK 600.  CT abd/pelvis shows ill defined pancreatic lesion and dilation of biliary tree.  Of note CT 2 months ago was apparently unremarkable.  Hospital Course: Discharge Diagnoses:  Principal Problem:   Obstructive jaundice due to malignant neoplasm [facility]) Active Problems:   COPD (chronic obstructive pulmonary disease) (HCC)   Non-small cell cancer of left lung (HCC)   Post-radiation pneumonitis (HCC)  Painless jaundice -patient was admitted to the hospital with painless jaundice.  CT scan on admission was concerning for mass in the pancreas highly suspicious for pancreatic adenocarcinoma, with dilatation of the intrahepatic biliary trees.  There is also mentioning about apparent encasement of the SMA by the pancreatic mass.  Gastroenterology was consulted and have follow patient while hospitalized.  She underwent an MRCP which showed infiltrative mass compatible with adenocarcinoma involving the pancreatic head and uncinate process, for full read please see below.  There is biliary dilatation due to malignant obstruction by the  pancreatic mass.  Dr. Paulita Fujita evaluated case, and felt that ERCP is best to be done at a tertiary center in her case.  Discussed with Dr. Paulita Fujita as well as Dr. Delrae Alfred at [facility], and patient is scheduled for an ERCP in 3 days.  She has poor appetite but relatively asymptomatic from her biliary obstruction, she has no nausea or vomiting and is able to tolerate diet well, her bilirubin, alkaline phosphatase and liver enzymes have remained stable, she will be discharged home with close outpatient follow-up at [facility]  It is likely that biopsies will be done at the time of the procedure, and she would likely need to see oncology afterwards. Lung carcinoma -She is status post stereotactic radiation therapy as well as concern for radiation pneumonitis.  She followed in the past with Dr. Earlie Server COPD -No wheezing, continue home medications   Discharge Instructions   Allergies as of 06/28/2017   No Known Allergies     Medication List    TAKE these medications   cholecalciferol 1000 units tablet Commonly known as:  VITAMIN D Take 1,000 Units by mouth daily.   fluticasone 50 MCG/ACT nasal spray Commonly known as:  FLONASE Place 2 sprays into both nostrils daily. What changed:    how much to take  when to take this  reasons to take this   omeprazole 20 MG tablet Commonly known as:  PRILOSEC OTC Take 20 mg by mouth daily.   OPTIVITE PO Take 1 capsule by mouth daily.   prednisoLONE acetate 1 % ophthalmic suspension Commonly known as:  PRED FORTE INT 1 GTT IN OD D START ON mon. wed and friday  PROAIR HFA 108 (90 Base) MCG/ACT inhaler Generic drug:  albuterol INHALE 1 PUFF INTO LUNGS EVERY 4 HOURS AS NEEDED   Tiotropium Bromide-Olodaterol 2.5-2.5 MCG/ACT Aers Commonly known as:  STIOLTO RESPIMAT Inhale 2 puffs into the lungs daily.   vitamin C 500 MG tablet Commonly known as:  ASCORBIC ACID Take 500 mg by mouth daily.   vitamin E 400 UNIT capsule Take 400 Units by mouth  daily.      Follow-up Information    Pawa, Rishi, MD Follow up in 2 day(s).   Contact information: [place] [address] [phone]        Curt Bears, MD .   Specialty:  Oncology Contact information: [place] [address] [phone]           Consultations:  Gastroenterology  Procedures/Studies:  Ct Abdomen Pelvis W Contrast  Result Date: 06/25/2017 CLINICAL DATA:  83 year old female with painless jaundice and weight loss and fatigue. History of left lower lobe lung adenocarcinoma status post radiation treatment in September 2016. EXAM: CT ABDOMEN AND PELVIS WITH CONTRAST TECHNIQUE: Multidetector CT imaging of the abdomen and pelvis was performed using the standard protocol following bolus administration of intravenous contrast. CONTRAST:  ISOVUE-300 IOPAMIDOL (ISOVUE-300) INJECTION 61% COMPARISON:  Chest CT dated 04/20/2017 and 04/20/2016 FINDINGS: Evaluation is limited due to streak artifact caused by patient's arms and metallic right hip arthroplasty. Lower chest: There is subpleural emphysematous changes of the lung bases posteriorly. Three vessel coronary vascular atherosclerosis. There is no intra-abdominal free air or free fluid. Hepatobiliary: The liver is unremarkable. There is diffuse dilatation of the intrahepatic biliary trees as well as diffuse dilatation of the common bile duct. The common bile duct measures up to 2.8 cm. This findings are new since the study of 04/20/2017. Pancreas: There is an ill-defined hypoenhancing soft tissue in the mid medial uncinate process of the pancreas measuring approximately 3.5 x 3.2 cm. There is abutment, and possible complete encasement, of the SMA with loss of the fat plane. Findings highly suspicious for primary pancreatic malignancy such as adenocarcinoma. Further evaluation with MRI, pancreatic for local/MRCP recommended. There is no dilatation of the pancreatic duct. Spleen:  Normal in size without focal abnormality. Adrenals/Urinary Tract: The adrenal glands are unremarkable. There is a 3 cm left renal inferior pole cyst. Subcentimeter right renal inferior pole hypodensity is too small to characterize. An area of hypodensity in the inferior pole of the right kidney posteriorly noted which also likely represents a cyst. There is no hydronephrosis on either side. The urinary bladder is grossly unremarkable but suboptimally visualized due to streak artifact caused by right hip arthroplasty. Stomach/Bowel: There is a large hiatal hernia. There is no bowel obstruction or active inflammation. There is moderate amount of stool within the colon. Appendectomy. Vascular/Lymphatic: There is advanced aortoiliac atherosclerotic disease. There is a 2.4 cm ectasia of infrarenal abdominal aorta. No portal venous gas identified. No adenopathy. Reproductive: The uterus is poorly visualized. Other: None Musculoskeletal: Osteopenia with extensive degenerative changes of the spine. Multilevel disc desiccation and vacuum phenomena. Total right hip arthroplasty. No acute osseous pathology. IMPRESSION: 1. Ill-defined hypoenhancing lesion in the uncinate process of the pancreas highly suspicious for pancreatic adenocarcinoma. Further evaluation with MRI/MRCP recommended. 2. Dilatation of the intrahepatic and intrahepatic biliary trees, not seen on the prior chest CT of 04/20/2017. 3. Apparent abutment or possibly encasement of the SMA by the pancreatic mass. 4. Large colonic stool burden. No bowel obstruction or active inflammation.  5. A 2.4 cm infrarenal aortic ectasia. 6.  Aortic Atherosclerosis (ICD10-I70.0). Electronically Signed   By: Anner Crete M.D.   On: 06/25/2017 22:26   Mr 3d Recon At Scanner  Result Date: 06/26/2017 CLINICAL DATA:  Painless jaundice with weight loss EXAM: MRI ABDOMEN WITHOUT AND WITH CONTRAST (INCLUDING MRCP) TECHNIQUE: Multiplanar multisequence MR imaging of the abdomen  was performed both before and after the administration of intravenous contrast. Heavily T2-weighted images of the biliary and pancreatic ducts were obtained, and three-dimensional MRCP images were rendered by post processing. CONTRAST:  14 cc MultiHance COMPARISON:  06/25/2017 FINDINGS: Despite efforts by the technologist and patient, motion artifact is present on today's exam and could not be eliminated. This reduces exam sensitivity and specificity. Lower chest: Large hiatal hernia.  Small left pleural effusion. Hepatobiliary: Severe intrahepatic and extrahepatic biliary dilatation extending down to the pancreatic mass. Truncation of the CBD in the vicinity of the mass. No focal hepatic metastatic lesions are identified. Pancreas: Abnormal low T1 signal and hypoenhancing mass in the head and uncinate process of the pancreas, highly indistinct due to the motion artifact and the infiltrative nature of the lesion, potentially up to 4.2 by 3.1 cm, with the mass at least partially encasing the superior mesenteric artery. Anatomic detail is much better on the CT scan due to the lack of motion artifact and greater spatial resolution. The proper hepatic artery appears to arise from the celiac trunk-common hepatic artery as is conventional. The mass severely narrows the portal vein, nearly occluding it. , near the confluence of the splenic vein and SMV. The mass also abuts the left anterolateral margin of the abdominal aorta and of course the duodenum. Surprising lack of dorsal pancreatic duct dilatation. Low-grade peripancreatic edema. Spleen:  Unremarkable, the splenic vein currently remains patent. Adrenals/Urinary Tract: Left kidney lower pole Bosniak category 2 cyst, with a small rim of high precontrast T1 signal on image 61/1300. Minimal scarring in the right kidney lower pole. Stomach/Bowel: Prominent stool throughout the colon favors constipation. Vascular/Lymphatic: Aortoiliac atherosclerotic vascular disease. The  relationship of the pancreatic mass with local vessels is ascribed above. No obvious separate adenopathy is appreciable Other:  No supplemental non-categorized findings. Musculoskeletal: Lumbar scoliosis, spondylosis, and degenerative disc disease. Old thoracic compression fractures. Right hip implant. IMPRESSION: 1. Infiltrative mass compatible with adenocarcinoma involving the pancreatic head and uncinate process, abutting significant portions of the superior mesenteric artery and abdominal aorta, and compressing but not totally occluding the confluence of the splenic vein and SMV. Anatomic detail is generally better on the recent CT scan due to the lack of motion artifact and better spatial resolution. Faint peripancreatic edema could be from pancreatitis but the dorsal pancreatic duct is not dilated. 2. Prominent biliary dilatation due to malignant obstruction by the pancreatic mass. I do not see discrete hepatic metastatic lesions. 3. Large hiatal hernia. 4. Small pleural effusion. 5. Bosniak category 2 benign left renal cyst. Minimal scarring in the right kidney lower pole. 6.  Prominent stool throughout the colon favors constipation. 7.  Aortic Atherosclerosis (ICD10-I70.0). 8. Lumbar spondylosis, scoliosis, and degenerative disc disease. Thoracic compression fractures. Electronically Signed   By: Van Clines M.D.   On: 06/26/2017 17:55   Mr Abdomen Mrcp Moise Boring Contast  Result Date: 06/26/2017 CLINICAL DATA:  Painless jaundice with weight loss EXAM: MRI ABDOMEN WITHOUT AND WITH CONTRAST (INCLUDING MRCP) TECHNIQUE: Multiplanar multisequence MR imaging of the abdomen was performed both before and after the administration of intravenous contrast. Heavily T2-weighted  images of the biliary and pancreatic ducts were obtained, and three-dimensional MRCP images were rendered by post processing. CONTRAST:  14 cc MultiHance COMPARISON:  06/25/2017 FINDINGS: Despite efforts by the technologist and patient,  motion artifact is present on today's exam and could not be eliminated. This reduces exam sensitivity and specificity. Lower chest: Large hiatal hernia.  Small left pleural effusion. Hepatobiliary: Severe intrahepatic and extrahepatic biliary dilatation extending down to the pancreatic mass. Truncation of the CBD in the vicinity of the mass. No focal hepatic metastatic lesions are identified. Pancreas: Abnormal low T1 signal and hypoenhancing mass in the head and uncinate process of the pancreas, highly indistinct due to the motion artifact and the infiltrative nature of the lesion, potentially up to 4.2 by 3.1 cm, with the mass at least partially encasing the superior mesenteric artery. Anatomic detail is much better on the CT scan due to the lack of motion artifact and greater spatial resolution. The proper hepatic artery appears to arise from the celiac trunk-common hepatic artery as is conventional. The mass severely narrows the portal vein, nearly occluding it. , near the confluence of the splenic vein and SMV. The mass also abuts the left anterolateral margin of the abdominal aorta and of course the duodenum. Surprising lack of dorsal pancreatic duct dilatation. Low-grade peripancreatic edema. Spleen:  Unremarkable, the splenic vein currently remains patent. Adrenals/Urinary Tract: Left kidney lower pole Bosniak category 2 cyst, with a small rim of high precontrast T1 signal on image 61/1300. Minimal scarring in the right kidney lower pole. Stomach/Bowel: Prominent stool throughout the colon favors constipation. Vascular/Lymphatic: Aortoiliac atherosclerotic vascular disease. The relationship of the pancreatic mass with local vessels is ascribed above. No obvious separate adenopathy is appreciable Other:  No supplemental non-categorized findings. Musculoskeletal: Lumbar scoliosis, spondylosis, and degenerative disc disease. Old thoracic compression fractures. Right hip implant. IMPRESSION: 1. Infiltrative mass  compatible with adenocarcinoma involving the pancreatic head and uncinate process, abutting significant portions of the superior mesenteric artery and abdominal aorta, and compressing but not totally occluding the confluence of the splenic vein and SMV. Anatomic detail is generally better on the recent CT scan due to the lack of motion artifact and better spatial resolution. Faint peripancreatic edema could be from pancreatitis but the dorsal pancreatic duct is not dilated. 2. Prominent biliary dilatation due to malignant obstruction by the pancreatic mass. I do not see discrete hepatic metastatic lesions. 3. Large hiatal hernia. 4. Small pleural effusion. 5. Bosniak category 2 benign left renal cyst. Minimal scarring in the right kidney lower pole. 6.  Prominent stool throughout the colon favors constipation. 7.  Aortic Atherosclerosis (ICD10-I70.0). 8. Lumbar spondylosis, scoliosis, and degenerative disc disease. Thoracic compression fractures. Electronically Signed   By: Van Clines M.D.   On: 06/26/2017 17:55     Subjective: -Mild nausea, no vomiting.  Able to eat well.  No abdominal pain  Discharge Exam: Vitals:   06/28/17 0933 06/28/17 1342  BP: 119/68 127/68  Pulse: 79 73  Resp: 18 18  Temp: 98.6 F (37 C) 98.2 F (36.8 C)  SpO2: 92% 93%    General: Pt is alert, awake, not in acute distress, skin icteric Cardiovascular: RRR, S1/S2 +, no rubs, no gallops Respiratory: CTA bilaterally, no wheezing, no rhonchi Abdominal: Soft, NT, ND, bowel sounds + Extremities: no edema, no cyanosis    The results of significant diagnostics from this hospitalization (including imaging, microbiology, ancillary and laboratory) are listed below for reference.     Microbiology: No results found  for this or any previous visit (from the past 240 hour(s)).   Labs: BNP (last 3 results) No results for input(s): BNP in the last 8760 hours. Basic Metabolic Panel: Recent Labs  Lab 06/25/17 1747  06/26/17 0245 06/27/17 0406 06/28/17 0300  NA 133* 134* 135 133*  K 4.1 3.3* 3.2* 3.2*  CL 101 103 100* 100*  CO2 23 24 27 26   GLUCOSE 108* 128* 95 109*  BUN 9 5* <5* 5*  CREATININE 0.66 0.60 0.64 0.55  CALCIUM 10.1 9.5 9.6 9.5   Liver Function Tests: Recent Labs  Lab 06/25/17 1747 06/26/17 0245 06/27/17 0406 06/28/17 0300  AST 393* 361* 382* 306*  ALT 345* 321* 361* 329*  ALKPHOS 634* 564* 625* 611*  BILITOT 10.7* 10.3* 10.6* 10.0*  PROT 6.7 5.6* 5.6* 5.3*  ALBUMIN 3.6 3.0* 2.8* 2.7*   Recent Labs  Lab 06/25/17 1747  LIPASE 47   No results for input(s): AMMONIA in the last 168 hours. CBC: Recent Labs  Lab 06/25/17 1747 06/26/17 0245 06/27/17 0406 06/28/17 0300  WBC 4.9 4.2 4.3 4.5  NEUTROABS 3.3  --   --   --   HGB 11.0* 10.0* 9.9* 9.4*  HCT 31.9* 28.6* 28.7* 26.8*  MCV 88.9 89.9 89.4 89.0  PLT 228 207 218 PLATELET CLUMPS NOTED ON SMEAR, COUNT APPEARS ADEQUATE   Cardiac Enzymes: No results for input(s): CKTOTAL, CKMB, CKMBINDEX, TROPONINI in the last 168 hours. BNP: Invalid input(s): POCBNP CBG: Recent Labs  Lab 06/28/17 0705  GLUCAP 103*   D-Dimer No results for input(s): DDIMER in the last 72 hours. Hgb A1c No results for input(s): HGBA1C in the last 72 hours. Lipid Profile No results for input(s): CHOL, HDL, LDLCALC, TRIG, CHOLHDL, LDLDIRECT in the last 72 hours. Thyroid function studies No results for input(s): TSH, T4TOTAL, T3FREE, THYROIDAB in the last 72 hours.  Invalid input(s): FREET3 Anemia work up No results for input(s): VITAMINB12, FOLATE, FERRITIN, TIBC, IRON, RETICCTPCT in the last 72 hours. Urinalysis    Component Value Date/Time   COLORURINE AMBER (A) 06/25/2017 2304   APPEARANCEUR CLEAR 06/25/2017 2304   LABSPEC 1.020 06/25/2017 2304   PHURINE 6.0 06/25/2017 2304   GLUCOSEU NEGATIVE 06/25/2017 2304   HGBUR NEGATIVE 06/25/2017 2304   BILIRUBINUR SMALL (A) 06/25/2017 2304   KETONESUR 5 (A) 06/25/2017 2304   PROTEINUR  NEGATIVE 06/25/2017 2304   UROBILINOGEN >=8.0 06/25/2017 1636   NITRITE NEGATIVE 06/25/2017 2304   LEUKOCYTESUR NEGATIVE 06/25/2017 2304   Sepsis Labs Invalid input(s): PROCALCITONIN,  WBC,  LACTICIDVEN   Time coordinating discharge: 50 minutes  SIGNED:  Marzetta Board, MD  [facility] 06/28/2017, 4:28 PM Pager [phone]  If 7PM-7AM, please contact night-coverage [email or web address] Password [email or web address]

## 2017-06-28 NOTE — Care Management Note (Signed)
Case Management Note  Patient Details  Name: Carol Cook MRN: [id number] Date of Birth: August 28, 1926  Subjective/Objective:  Pt admitted with obstructive jaundice d/t malignant neoplasm. She is from home alone.                 Action/Plan: Pt to have ERCP today. MD please order PT/OT as needed. CM following for d/c needs, physician orders.   Expected Discharge Date:                  Expected Discharge Plan:     In-House Referral:     Discharge planning Services     Post Acute Care Choice:    Choice offered to:     DME Arranged:    DME Agency:     HH Arranged:    HH Agency:     Status of Service:  In process, will continue to follow  If discussed at Long Length of Stay Meetings, dates discussed:    Additional Comments:  Pollie Friar, RN 06/28/2017, 10:50 AM

## 2017-06-28 NOTE — Discharge Instructions (Signed)
Go to [facility] on Wednesday 06/30/2017 at 7:30 am. Address is [address], [place], [place] Procedure itself is on Thursday, 07/01/2017 at 7 am  For further questions please contact coordinating RN, [place], at [phone]

## 2017-06-28 NOTE — Consult Note (Signed)
Carol Cook  Referring Provider: Marzetta Board, MD Primary Care Physician:  Jonathon Jordan, MD Primary Gastroenterologist:  None  Reason for Consultation:  jaundice  HPI: Carol Cook is a 83 y.o. female presenting with few-day history of malaise, vague abdominal pain, jaundice, and dark urine.  Patient's work up significant for mixed type (but predominantly cholestatic) pattern elevated LFTs, dilated bile duct and suspected mass in uncinate process.  She has vague lower abdominal pain, but tells me this has been intermittent problems for years stemming after surgery on her appendix.  She has had cholecystectomy.  No blood in stool.     Past Medical History:  Diagnosis Date  . COPD (chronic obstructive pulmonary disease) ([place])   . Non-small cell lung cancer (NSCLC) ([place])   . Post-radiation pneumonitis [facility])     Past Surgical History:  Procedure Laterality Date  . APPENDECTOMY    . cataracts     surgery  . CHOLECYSTECTOMY    . REPLACEMENT TOTAL KNEE Left   . TOTAL HIP ARTHROPLASTY Right     Prior to Admission medications   Medication Sig Start Date End Date Taking? Authorizing Provider  cholecalciferol (VITAMIN D) 1000 units tablet Take 1,000 Units by mouth daily.   Yes [provider]  fluticasone (FLONASE) 50 MCG/ACT nasal spray Place 2 sprays into both nostrils daily. Patient taking differently: Place 1 spray into both nostrils as needed.  03/26/16  Yes Collene Gobble, MD  Multiple Vitamins-Minerals (OPTIVITE PO) Take 1 capsule by mouth daily.   Yes [provider]  omeprazole (PRILOSEC OTC) 20 MG tablet Take 20 mg by mouth daily.   Yes [provider]  prednisoLONE acetate (PRED FORTE) 1 % ophthalmic suspension INT 1 GTT IN OD D START ON mon. wed and friday 04/14/17  Yes [provider]  PROAIR HFA 108 (90 Base) MCG/ACT inhaler INHALE 1 PUFF INTO LUNGS EVERY 4 HOURS AS NEEDED 04/14/17  Yes Collene Gobble, MD   Tiotropium Bromide-Olodaterol (STIOLTO RESPIMAT) 2.5-2.5 MCG/ACT AERS Inhale 2 puffs into the lungs daily. 06/07/17  Yes Collene Gobble, MD  vitamin C (ASCORBIC ACID) 500 MG tablet Take 500 mg by mouth daily.   Yes [provider]  vitamin E 400 UNIT capsule Take 400 Units by mouth daily.   Yes [provider]    Current Facility-Administered Medications  Medication Dose Route Frequency Provider Last Rate Last Dose  . 0.9 %  sodium chloride infusion   Intravenous Continuous Wilford Corner, MD 20 mL/hr at 06/26/17 2052    . 0.9 %  sodium chloride infusion   Intravenous Continuous Wilford Corner, MD      . acetaminophen (TYLENOL) tablet 650 mg  650 mg Oral Q6H PRN Etta Quill, DO       Or  . acetaminophen (TYLENOL) suppository 650 mg  650 mg Rectal Q6H PRN Etta Quill, DO      . albuterol (PROVENTIL) (2.5 MG/3ML) 0.083% nebulizer solution 3 mL  3 mL Inhalation Q4H PRN Etta Quill, DO      . arformoterol [facility]) nebulizer solution 15 mcg  15 mcg Nebulization BID Etta Quill, DO   15 mcg at 06/28/17 0746  . cholecalciferol (VITAMIN D) tablet 1,000 Units  1,000 Units Oral Daily Etta Quill, DO   1,000 Units at 06/28/17 1137  . docusate sodium (COLACE) capsule 100 mg  100 mg Oral Daily PRN Etta Quill, DO      . feeding  supplement (BOOST / RESOURCE BREEZE) liquid 1 Container  1 Container Oral TID BM Etta Quill, DO   1 Container at 06/28/17 1138  . fluticasone (FLONASE) 50 MCG/ACT nasal spray 1 spray  1 spray Each Nare PRN Etta Quill, DO      . LORazepam (ATIVAN) injection 0.5 mg  0.5 mg Intravenous Q5 Min x 2 PRN Caren Griffins, MD   0.5 mg at 06/26/17 1527  . multivitamin liquid 15 mL  15 mL Oral Daily Caren Griffins, MD   15 mL at 06/28/17 1139  . ondansetron (ZOFRAN) tablet 4 mg  4 mg Oral Q6H PRN Etta Quill, DO       Or  . ondansetron [facility]) injection 4 mg  4 mg Intravenous Q6H PRN Etta Quill, DO   4 mg at  06/27/17 0636  . pantoprazole (PROTONIX) EC tablet 40 mg  40 mg Oral BID Wilford Corner, MD   40 mg at 06/28/17 1137  . prednisoLONE acetate (PRED FORTE) 1 % ophthalmic suspension 1 drop  1 drop Right Eye Q M,W,F Etta Quill, DO   1 drop at 06/28/17 1139  . traZODone (DESYREL) tablet 25 mg  25 mg Oral QHS PRN Caren Griffins, MD   25 mg at 06/27/17 2131  . umeclidinium bromide (INCRUSE ELLIPTA) 62.5 MCG/INH 1 puff  1 puff Inhalation Daily Jennette Kettle M, DO   1 puff at 06/28/17 0745    Allergies as of 06/25/2017  . (No Known Allergies)    Family History  Problem Relation Age of Onset  . Dementia Mother   . Hypertension Mother   . Deep vein thrombosis Father     Social History   Socioeconomic History  . Marital status: Widowed    Spouse name: Not on file  . Number of children: Not on file  . Years of education: Not on file  . Highest education level: Not on file  Social Needs  . Financial resource strain: Not on file  . Food insecurity - worry: Not on file  . Food insecurity - inability: Not on file  . Transportation needs - medical: Not on file  . Transportation needs - non-medical: Not on file  Occupational History  . Not on file  Tobacco Use  . Smoking status: Former Smoker    Types: Cigarettes    Last attempt to quit: 08/25/2011    Years since quitting: 5.8  . Smokeless tobacco: Never Used  Substance and Sexual Activity  . Alcohol use: Yes    Comment: occ  . Drug use: No  . Sexual activity: Not on file  Other Topics Concern  . Not on file  Social History Narrative  . Not on file    Review of Systems: As per HPI all ohters negative  Physical Exam: Vital signs in last 24 hours: Temp:  [98.2 F (36.8 C)-99 F (37.2 C)] 98.6 F (37 C) (11/05 0933) Pulse Rate:  [68-80] 79 (11/05 0933) Resp:  [16-20] 18 (11/05 0933) BP: (119-131)/(53-68) 119/68 (11/05 0933) SpO2:  [92 %-96 %] 92 % (11/05 0933) Last BM Date: 06/26/17 General:   Alert, jaundiced,  NAD Head:  Normocephalic and atraumatic. Eyes:  Sclera icteric bilaterally,   Conjunctiva pink. Ears:  Normal auditory acuity. Nose:  No deformity, discharge,  or lesions. Mouth:  No deformity or lesions.  Oropharynx pink & moist. Abdomen:  Soft, nontender and nondistended. No masses, hepatosplenomegaly or hernias noted. Normal bowel sounds, without guarding,  and without rebound.     Msk:  Symmetrical without gross deformities. Normal posture. Pulses:  Normal pulses noted. Extremities:  Without clubbing or edema. Neurologic:  Alert and  oriented x4; diffusely weak, otherwise grossly normal neurologically. Skin:  Intensely jaundiced, otherwise intact without significant lesions or rashes. Psych:  Alert and cooperative. Normal mood and affect.   Lab Results: Recent Labs    06/26/17 0245 06/27/17 0406 06/28/17 0300  WBC 4.2 4.3 4.5  HGB 10.0* 9.9* 9.4*  HCT 28.6* 28.7* 26.8*  PLT 207 218 PLATELET CLUMPS NOTED ON SMEAR, COUNT APPEARS ADEQUATE   BMET Recent Labs    06/26/17 0245 06/27/17 0406 06/28/17 0300  NA 134* 135 133*  K 3.3* 3.2* 3.2*  CL 103 100* 100*  CO2 24 27 26   GLUCOSE 128* 95 109*  BUN 5* <5* 5*  CREATININE 0.60 0.64 0.55  CALCIUM 9.5 9.6 9.5   LFT Recent Labs    06/28/17 0300  PROT 5.3*  ALBUMIN 2.7*  AST 306*  ALT 329*  ALKPHOS 611*  BILITOT 10.0*   PT/INR Recent Labs    06/27/17 0406  LABPROT 12.6  INR 0.96    Studies/Results: Mr 3d Recon At Scanner  Result Date: 06/26/2017 CLINICAL DATA:  Painless jaundice with weight loss EXAM: MRI ABDOMEN WITHOUT AND WITH CONTRAST (INCLUDING MRCP) TECHNIQUE: Multiplanar multisequence MR imaging of the abdomen was performed both before and after the administration of intravenous contrast. Heavily T2-weighted images of the biliary and pancreatic ducts were obtained, and three-dimensional MRCP images were rendered by post processing. CONTRAST:  14 cc MultiHance COMPARISON:  06/25/2017 FINDINGS: Despite  efforts by the technologist and patient, motion artifact is present on today's exam and could not be eliminated. This reduces exam sensitivity and specificity. Lower chest: Large hiatal hernia.  Small left pleural effusion. Hepatobiliary: Severe intrahepatic and extrahepatic biliary dilatation extending down to the pancreatic mass. Truncation of the CBD in the vicinity of the mass. No focal hepatic metastatic lesions are identified. Pancreas: Abnormal low T1 signal and hypoenhancing mass in the head and uncinate process of the pancreas, highly indistinct due to the motion artifact and the infiltrative nature of the lesion, potentially up to 4.2 by 3.1 cm, with the mass at least partially encasing the superior mesenteric artery. Anatomic detail is much better on the CT scan due to the lack of motion artifact and greater spatial resolution. The proper hepatic artery appears to arise from the celiac trunk-common hepatic artery as is conventional. The mass severely narrows the portal vein, nearly occluding it. , near the confluence of the splenic vein and SMV. The mass also abuts the left anterolateral margin of the abdominal aorta and of course the duodenum. Surprising lack of dorsal pancreatic duct dilatation. Low-grade peripancreatic edema. Spleen:  Unremarkable, the splenic vein currently remains patent. Adrenals/Urinary Tract: Left kidney lower pole Bosniak category 2 cyst, with a small rim of high precontrast T1 signal on image 61/1300. Minimal scarring in the right kidney lower pole. Stomach/Bowel: Prominent stool throughout the colon favors constipation. Vascular/Lymphatic: Aortoiliac atherosclerotic vascular disease. The relationship of the pancreatic mass with local vessels is ascribed above. No obvious separate adenopathy is appreciable Other:  No supplemental non-categorized findings. Musculoskeletal: Lumbar scoliosis, spondylosis, and degenerative disc disease. Old thoracic compression fractures. Right hip  implant. IMPRESSION: 1. Infiltrative mass compatible with adenocarcinoma involving the pancreatic head and uncinate process, abutting significant portions of the superior mesenteric artery and abdominal aorta, and compressing but not totally occluding the confluence  of the splenic vein and SMV. Anatomic detail is generally better on the recent CT scan due to the lack of motion artifact and better spatial resolution. Faint peripancreatic edema could be from pancreatitis but the dorsal pancreatic duct is not dilated. 2. Prominent biliary dilatation due to malignant obstruction by the pancreatic mass. I do not see discrete hepatic metastatic lesions. 3. Large hiatal hernia. 4. Small pleural effusion. 5. Bosniak category 2 benign left renal cyst. Minimal scarring in the right kidney lower pole. 6.  Prominent stool throughout the colon favors constipation. 7.  Aortic Atherosclerosis (ICD10-I70.0). 8. Lumbar spondylosis, scoliosis, and degenerative disc disease. Thoracic compression fractures. Electronically Signed   By: Van Clines M.D.   On: 06/26/2017 17:55   Mr Abdomen Mrcp Moise Boring Contast  Result Date: 06/26/2017 CLINICAL DATA:  Painless jaundice with weight loss EXAM: MRI ABDOMEN WITHOUT AND WITH CONTRAST (INCLUDING MRCP) TECHNIQUE: Multiplanar multisequence MR imaging of the abdomen was performed both before and after the administration of intravenous contrast. Heavily T2-weighted images of the biliary and pancreatic ducts were obtained, and three-dimensional MRCP images were rendered by post processing. CONTRAST:  14 cc MultiHance COMPARISON:  06/25/2017 FINDINGS: Despite efforts by the technologist and patient, motion artifact is present on today's exam and could not be eliminated. This reduces exam sensitivity and specificity. Lower chest: Large hiatal hernia.  Small left pleural effusion. Hepatobiliary: Severe intrahepatic and extrahepatic biliary dilatation extending down to the pancreatic mass.  Truncation of the CBD in the vicinity of the mass. No focal hepatic metastatic lesions are identified. Pancreas: Abnormal low T1 signal and hypoenhancing mass in the head and uncinate process of the pancreas, highly indistinct due to the motion artifact and the infiltrative nature of the lesion, potentially up to 4.2 by 3.1 cm, with the mass at least partially encasing the superior mesenteric artery. Anatomic detail is much better on the CT scan due to the lack of motion artifact and greater spatial resolution. The proper hepatic artery appears to arise from the celiac trunk-common hepatic artery as is conventional. The mass severely narrows the portal vein, nearly occluding it. , near the confluence of the splenic vein and SMV. The mass also abuts the left anterolateral margin of the abdominal aorta and of course the duodenum. Surprising lack of dorsal pancreatic duct dilatation. Low-grade peripancreatic edema. Spleen:  Unremarkable, the splenic vein currently remains patent. Adrenals/Urinary Tract: Left kidney lower pole Bosniak category 2 cyst, with a small rim of high precontrast T1 signal on image 61/1300. Minimal scarring in the right kidney lower pole. Stomach/Bowel: Prominent stool throughout the colon favors constipation. Vascular/Lymphatic: Aortoiliac atherosclerotic vascular disease. The relationship of the pancreatic mass with local vessels is ascribed above. No obvious separate adenopathy is appreciable Other:  No supplemental non-categorized findings. Musculoskeletal: Lumbar scoliosis, spondylosis, and degenerative disc disease. Old thoracic compression fractures. Right hip implant. IMPRESSION: 1. Infiltrative mass compatible with adenocarcinoma involving the pancreatic head and uncinate process, abutting significant portions of the superior mesenteric artery and abdominal aorta, and compressing but not totally occluding the confluence of the splenic vein and SMV. Anatomic detail is generally better on  the recent CT scan due to the lack of motion artifact and better spatial resolution. Faint peripancreatic edema could be from pancreatitis but the dorsal pancreatic duct is not dilated. 2. Prominent biliary dilatation due to malignant obstruction by the pancreatic mass. I do not see discrete hepatic metastatic lesions. 3. Large hiatal hernia. 4. Small pleural effusion. 5. Bosniak category 2 benign  left renal cyst. Minimal scarring in the right kidney lower pole. 6.  Prominent stool throughout the colon favors constipation. 7.  Aortic Atherosclerosis (ICD10-I70.0). 8. Lumbar spondylosis, scoliosis, and degenerative disc disease. Thoracic compression fractures. Electronically Signed   By: Van Clines M.D.   On: 06/26/2017 17:55    Impression:  1.  Obstructive jaundice.  No evidence of cholangitis. 2.  Elevated LFTs. 3.  Dilated bile duct with suspicion for mass in uncinate pancreas abutment of SMA and invasion of SV/SMV confluence. 4.  Large hiatal hernia; upon my review of imaging, appears to be near-complete intrathoracic stomach with possible paraesophageal hernia. 5.  Prior history lung cancer with treatment.  Plan:  1.  I don't feel comfortable myself in light of intrathoracic stomach and patient's age/comorbidities doing EUS/ERCP.  Discussed need for tissue diagnosis should she wish to pursue treatment of presumed pancreatic cancer; discussed that mass does not appear operable, so any treatment would be palliative in nature; placement of metal stent would not "cure" the cancer but would simply help LFTs normalize and decrease risk of interval development of cholangitis or significant pruritus. 2.  I have discussed with Dr. Harley Hallmark at [facility], who has graciously agreed to arrange expedited outpatient procedures.  Patient can have diet advanced and can be discharged home once an outpatient appt date with [facility] has been arranged. 3.  [facility] will sign-off; please call with  questions; thank you for the consultation.   LOS: 3 days   Kaytlyn Din M  06/28/2017, 12:56 PM  Cell [phone] If no answer or after 5 PM call [phone]

## 2017-06-28 NOTE — Progress Notes (Signed)
Pt being discharged from hospital per orders from MD. Pt and family educated on discharge instructions. Pt and family verbalized understanding of instructions. All questions and concerns were addressed. Pt's IV was removed prior to discharge. Pt exited hospital via wheelchair.

## 2017-06-28 NOTE — Care Management Note (Signed)
Case Management Note  Patient Details  Name: Carol Cook MRN: [id number] Date of Birth: September 26, 1926  Subjective/Objective:                    Action/Plan: Pt d/cing home with self care. Pt has PCP, insurance and transportation home. No further needs per CM.   Expected Discharge Date:  06/28/17               Expected Discharge Plan:  Home/Self Care  In-House Referral:     Discharge planning Services     Post Acute Care Choice:    Choice offered to:     DME Arranged:    DME Agency:     HH Arranged:    HH Agency:     Status of Service:  Completed, signed off  If discussed at [employer] of Stay Meetings, dates discussed:    Additional Comments:  Pollie Friar, RN 06/28/2017, 4:35 PM

## 2017-06-29 NOTE — Consult Note (Signed)
            [facility] CM Primary Care Navigator  06/29/2017  Carol Cook 1927/04/01 [id number]   Attempt to seepatient at the bedsideto identify possible discharge needs but she was alreadydischargedper staff report.  Patient was discharged home yesterday.  Primary care provider's officeis listed as doing transition of care (TOC).   For additional questions please contact:  Edwena Felty A. Kendra Woolford, BSN, RN-BC [facility] PRIMARY CARE Navigator Cell: [phone]

## 2017-07-01 DIAGNOSIS — E669 Obesity, unspecified: Secondary | ICD-10-CM | POA: Diagnosis not present

## 2017-07-01 DIAGNOSIS — Z791 Long term (current) use of non-steroidal anti-inflammatories (NSAID): Secondary | ICD-10-CM | POA: Diagnosis not present

## 2017-07-01 DIAGNOSIS — Z923 Personal history of irradiation: Secondary | ICD-10-CM | POA: Diagnosis not present

## 2017-07-01 DIAGNOSIS — J449 Chronic obstructive pulmonary disease, unspecified: Secondary | ICD-10-CM | POA: Diagnosis not present

## 2017-07-01 DIAGNOSIS — K449 Diaphragmatic hernia without obstruction or gangrene: Secondary | ICD-10-CM | POA: Diagnosis not present

## 2017-07-01 DIAGNOSIS — R17 Unspecified jaundice: Secondary | ICD-10-CM | POA: Diagnosis not present

## 2017-07-01 DIAGNOSIS — Z85118 Personal history of other malignant neoplasm of bronchus and lung: Secondary | ICD-10-CM | POA: Diagnosis not present

## 2017-07-01 DIAGNOSIS — Z6830 Body mass index (BMI) 30.0-30.9, adult: Secondary | ICD-10-CM | POA: Diagnosis not present

## 2017-07-01 DIAGNOSIS — K831 Obstruction of bile duct: Secondary | ICD-10-CM | POA: Diagnosis not present

## 2017-07-01 DIAGNOSIS — C259 Malignant neoplasm of pancreas, unspecified: Secondary | ICD-10-CM | POA: Diagnosis not present

## 2017-07-01 DIAGNOSIS — K219 Gastro-esophageal reflux disease without esophagitis: Secondary | ICD-10-CM | POA: Diagnosis not present

## 2017-07-01 DIAGNOSIS — Z79899 Other long term (current) drug therapy: Secondary | ICD-10-CM | POA: Diagnosis not present

## 2017-07-01 DIAGNOSIS — Z87891 Personal history of nicotine dependence: Secondary | ICD-10-CM | POA: Diagnosis not present

## 2017-07-01 DIAGNOSIS — Z9049 Acquired absence of other specified parts of digestive tract: Secondary | ICD-10-CM | POA: Diagnosis not present

## 2017-07-01 DIAGNOSIS — J439 Emphysema, unspecified: Secondary | ICD-10-CM | POA: Diagnosis not present

## 2017-07-01 DIAGNOSIS — M40209 Unspecified kyphosis, site unspecified: Secondary | ICD-10-CM | POA: Diagnosis not present

## 2017-07-01 DIAGNOSIS — C250 Malignant neoplasm of head of pancreas: Secondary | ICD-10-CM | POA: Diagnosis not present

## 2017-07-01 DIAGNOSIS — K869 Disease of pancreas, unspecified: Secondary | ICD-10-CM | POA: Diagnosis not present

## 2017-07-01 HISTORY — DX: Malignant neoplasm of pancreas, unspecified: C25.9

## 2017-07-02 DIAGNOSIS — J449 Chronic obstructive pulmonary disease, unspecified: Secondary | ICD-10-CM | POA: Diagnosis not present

## 2017-07-02 DIAGNOSIS — J700 Acute pulmonary manifestations due to radiation: Secondary | ICD-10-CM | POA: Diagnosis not present

## 2017-07-02 DIAGNOSIS — K831 Obstruction of bile duct: Secondary | ICD-10-CM | POA: Diagnosis not present

## 2017-07-02 DIAGNOSIS — C3492 Malignant neoplasm of unspecified part of left bronchus or lung: Secondary | ICD-10-CM | POA: Diagnosis not present

## 2017-07-07 ENCOUNTER — Encounter: Payer: Self-pay | Admitting: Oncology

## 2017-07-07 ENCOUNTER — Telehealth: Payer: Self-pay | Admitting: Oncology

## 2017-07-07 NOTE — Telephone Encounter (Signed)
Appt has been scheduled for the pt to see Dr. Benay Spice on 11/20 at [place] to the pt and gave the appt date and time. Lft a vm on her son's phone, Herbie [place], with the appt date and time. Letter mailed.

## 2017-07-12 NOTE — Progress Notes (Signed)
  Oncology Nurse Navigator Documentation  Navigator Location: [facility]- (07/12/17 1245) Referral date to RadOnc/MedOnc: 07/07/17 (07/12/17 1245) )Navigator Encounter Type: Introductory phone call;Telephone (07/12/17 1245) Telephone: [place] [facility] (07/12/17 1245) Abnormal Finding Date: 06/25/17 (07/12/17 1245) Confirmed Diagnosis Date: 06/26/17 (07/12/17 1245)                 Treatment Phase: Pre-Tx/Tx Discussion;Abnormal Scans (07/12/17 1245) Barriers/Navigation Needs: Education;Coordination of Care (07/12/17 1245) Education: Understanding Cancer/ Treatment Options;Accessing Care/ Finding Providers;Newly Diagnosed Cancer Education (07/12/17 1245) Interventions: Education;Psycho-social support (07/12/17 1245)  I called patient to introduce myself and my role as GI navigator. I confirmed patient's appointment with Dr. Benay Spice on 07/13/17 @ 2PM. Patient is aware that [facility] has valet parking and wheelchairs available to patients if needed. Patient will be accompanied by two or three of her children. Patient verbalized understanding that she can call [facility] and ask for me if she has any questions or concerns. I plan to meet with patient during her appointment on 07/13/17.   Education Method: Verbal (07/12/17 1245)      Acuity: Level 2 (07/12/17 1245)   Acuity Level 2: Educational needs;Ongoing guidance and education throughout treatment as needed;Initial guidance, education and coordination as needed (07/12/17 1245)     Time Spent with Patient: 15 (07/12/17 1245)

## 2017-07-13 ENCOUNTER — Ambulatory Visit (HOSPITAL_BASED_OUTPATIENT_CLINIC_OR_DEPARTMENT_OTHER): Payer: PPO | Admitting: Oncology

## 2017-07-13 DIAGNOSIS — R17 Unspecified jaundice: Secondary | ICD-10-CM

## 2017-07-13 DIAGNOSIS — Z85118 Personal history of other malignant neoplasm of bronchus and lung: Secondary | ICD-10-CM | POA: Diagnosis not present

## 2017-07-13 DIAGNOSIS — K831 Obstruction of bile duct: Principal | ICD-10-CM

## 2017-07-13 DIAGNOSIS — D649 Anemia, unspecified: Secondary | ICD-10-CM | POA: Diagnosis not present

## 2017-07-13 DIAGNOSIS — C250 Malignant neoplasm of head of pancreas: Secondary | ICD-10-CM | POA: Diagnosis not present

## 2017-07-13 DIAGNOSIS — C801 Malignant (primary) neoplasm, unspecified: Secondary | ICD-10-CM

## 2017-07-13 NOTE — Progress Notes (Signed)
[place] New Patient Consult   Referring MD: Jonathon Jordan, Md [address] [place], [place] [address]   Carol Cook 83 y.o.  [birth year]    Reason for Referral: Pancreas cancer   HPI: She reports abdominal bloating for several weeks prior to presenting to the [facility] emergency room 06/25/2017 with jaundice.  The liver enzymes and bilirubin were markedly elevated. A CT of the abdomen and pelvis revealed an ill-defined lesion in the uncinate process suspicious for adenocarcinoma.  There was dilatation of the biliary tree.  The mass appeared to abut the superior mesenteric artery.  No adenopathy.  The liver appeared unremarkable.  No free fluid.  An MRI of the abdomen 06/26/2018 confirmed a mass involving the pancreas head and uncinate process abutting the superior mesenteric artery and abdominal aorta.  The mass compressed the confluence of the splenic vein and superior mesenteric vein.  Prominent biliary dilatation.  No metastases.  Small left pleural effusion.  She was referred to [facility] and underwent an ERCP for stent placement on 07/01/2017.  An EUS biopsy of the pancreas mass confirmed adenocarcinoma ([id number]) She reports the jaundice has resolved in the upper abdominal fullness has improved.   Past Medical History:  Diagnosis Date  . COPD (chronic obstructive pulmonary disease) ([place])   . Non-small cell lung cancer (NSCLC) ([place])- EGFR/ROS-1/LAK negative, status post SBRT, 50 Gy in 5 fractions  June 2016  . Post-radiation pneumonitis ([place])     .  G3P3  Past Surgical History:  Procedure Laterality Date  . APPENDECTOMY    . cataracts     surgery  . CHOLECYSTECTOMY    . REPLACEMENT TOTAL KNEE Left   . TOTAL HIP ARTHROPLASTY Right     Medications: Reviewed  Allergies: No Known Allergies  Family history: No family history of cancer  Social History:  She lives alone in [place].  She is a retired Company secretary.  She quit smoking  cigarettes 5 years ago.  Moderate alcohol use.  She was transfused with red cells at the time of an appendectomy in 1939  ROS:   Positives include: Anorexia, 30 pound weight loss, early satiety, mild nausea after eating a large meal, constipation  A complete ROS was otherwise negative.  Physical Exam:  Blood pressure (!) 150/83, pulse 70, temperature 98 F (36.7 C), temperature source Oral, resp. rate 18, height 5' 2"  (1.575 m), weight 161 lb 11.2 oz (73.3 kg), SpO2 99 %.  HEENT: Upper denture plate, edentulous, oropharynx without visible mass, neck without mass Lungs: Distant breath sounds, no respiratory distress Cardiac: Regular rate and rhythm Abdomen: No hepatosplenomegaly, no mass, no apparent ascites, nontender  Vascular: No leg edema Lymph nodes: No cervical, supraclavicular, axillary, or inguinal nodes Neurologic: Alert and oriented, the motor exam appears intact in the upper and lower extremities Skin: No rash Musculoskeletal: No spine tenderness, kyphosis   LAB:  CBC  Lab Results  Component Value Date   WBC 4.5 06/28/2017   HGB 9.4 (L) 06/28/2017   HCT 26.8 (L) 06/28/2017   MCV 89.0 06/28/2017   PLT  06/28/2017    PLATELET CLUMPS NOTED ON SMEAR, COUNT APPEARS ADEQUATE   NEUTROABS 3.3 06/25/2017     06/25/2017: CA 19-9    462    Imaging: As per HPI-CT 06/25/2017 and MRI 06/26/2017-images reviewed      Assessment/Plan:   1. Adenocarcinoma of the pancreas head/uncinate, status post an EUS biopsy at [facility] 02/28/2017  CT abdomen/pelvis 06/25/2018- uncinate  mass, abutment of the superior mesenteric artery, biliary obstruction  MRCP 06/26/2017- pancreas head/uncinate mass with abutment of the superior mesenteric artery and abdominal aorta, compression of the splenic vein and SMV, biliary dilatation, small left pleural effusion 2. Obstructive jaundice secondary to #1, status post placement of a biliary stent at [facility] 07/01/2017  3.   COPD  4.    Non-small cell lung cancer, hypermetabolic left upper lobe mass, favor adenocarcinoma, EGFR, ALK , and ROS-1 negative   SBRT at [facility], 50 Gy in 5 fractions 05/01/2015-05/07/2015  5.  Anemia  Disposition:   [name] has been diagnosed with locally advanced pancreas cancer.  I discussed the prognosis and treatment options with Ms.Pfiffner and her family.  The tumor appears unresectable and she is not a candidate for a pancreas resection procedure.  We discussed supportive/comfort care, systemic chemotherapy, and radiation options.  We discussed stereotactic radiosurgery and conventional radiation with sensitizing capecitabine.  We reviewed the potential toxicities associated with capecitabine.  She indicated that she does not wish to receive chemotherapy, but will consider Decitabine if recommended by the radiation oncologist.  We made a referral to Dr. Lisbeth Renshaw to consider radiation to the pancreas mass.  There is no clinical evidence of distant metastatic disease.  She will return for an office visit in 1 month.  She was noted to have anemia on hospital admission earlier this month.  We will check a CBC when she returns next month.  50 minutes were spent with the patient today.  The majority of the time was used for counseling and coordination of care.  Betsy Coder, MD  07/13/2017, 2:20 PM

## 2017-07-13 NOTE — Progress Notes (Signed)
  Oncology Nurse Navigator Documentation  Navigator Location: [facility] (07/13/17 1610)   )Navigator Encounter Type: Initial MedOnc (07/13/17 1610)                     Patient Visit Type: MedOnc (07/13/17 1610) Treatment Phase: Pre-Tx/Tx Discussion (07/13/17 1610) Barriers/Navigation Needs: Coordination of Care (07/13/17 1610)   Interventions: Referrals (07/13/17 1610)     Education Method: Verbal (07/13/17 1610)   Met with patient during initial med/onc appointment with Dr. Benay Spice. Patient encouraged to call me with questions or concerns. Per Dr. Gearldine Shown request I called and left voice mail for Shona Simpson PA and placed rad/onc referral for patient to be seen by Dr. Lisbeth Renshaw.       Acuity Level 2: Assistance expediting appointments;Ongoing guidance and education throughout treatment as needed;Referrals such as genetics, survivorship;Initial guidance, education and coordination as needed (07/13/17 1610)     Time Spent with Patient: 30 (07/13/17 1610)

## 2017-07-14 ENCOUNTER — Encounter: Payer: Self-pay | Admitting: Radiation Oncology

## 2017-07-19 ENCOUNTER — Encounter: Payer: Self-pay | Admitting: Radiation Oncology

## 2017-07-19 NOTE — Progress Notes (Addendum)
GI Location of Tumor / Histology: Pancreas  Carol Cook presented  months ago with symptoms of: Jaundice/abdominal bloating  Biopsies of  (if applicable) revealed: 940/768 BIOPSY PANCREAS=ADENOCARCINOMA  Past/Anticipated interventions by surgeon, if any: ERCP with stent  Placement at [facility]  07/01/17, EUS bx Dr. Arvil Chaco P[place],MD  Past/Anticipated interventions by medical oncology, if any: Dr. Benay Spice 07/13/17,  Weight changes, if any: 30 lb loss    Bowel/Bladder complaints, if GSU:[name],  Bladder normal  Nausea / Vomiting, if any: NO  Pain issues, if any:  Generalized achiness abdomen  Any blood per rectum:   NO  SAFETY ISSUES:  Prior radiation? Yes, 50Gy/5 fractions lung  At [facility] 05/01/15-05/07/15  Pacemaker/ICD? NO  Is the patient on methotrexate?  NO  Current Complaints/Details:COPD, Non-small cell lung cancer , retired minister,quit cigarettes 5 years ago,moderate alcohol use  Allergies:NKA BP (!) 141/85   Pulse 78   Temp 97.8 F (36.6 C) (Oral)   Resp 20   Ht 5\' 2"  (1.575 m)   Wt 159 lb 9.6 oz (72.4 kg)   BMI 29.19 kg/m   Wt Readings from Last 3 Encounters:  07/20/17 159 lb 9.6 oz (72.4 kg)  07/13/17 161 lb 11.2 oz (73.3 kg)  06/26/17 164 lb 14.5 oz (74.8 kg)

## 2017-07-20 ENCOUNTER — Ambulatory Visit
Admission: RE | Admit: 2017-07-20 | Discharge: 2017-07-20 | Disposition: A | Discharge status: Home / Self Care | Payer: PPO | Source: Ambulatory Visit | Attending: Radiation Oncology | Admitting: Radiation Oncology

## 2017-07-20 ENCOUNTER — Encounter: Payer: Self-pay | Admitting: Radiation Oncology

## 2017-07-20 ENCOUNTER — Telehealth: Payer: Self-pay | Admitting: *Deleted

## 2017-07-20 ENCOUNTER — Other Ambulatory Visit: Payer: Self-pay | Admitting: *Deleted

## 2017-07-20 VITALS — BP 141/85 | HR 78 | Temp 97.8°F | Resp 20 | Ht 62.0 in | Wt 159.6 lb

## 2017-07-20 DIAGNOSIS — Z85118 Personal history of other malignant neoplasm of bronchus and lung: Secondary | ICD-10-CM | POA: Insufficient documentation

## 2017-07-20 DIAGNOSIS — G4700 Insomnia, unspecified: Secondary | ICD-10-CM | POA: Insufficient documentation

## 2017-07-20 DIAGNOSIS — Z7951 Long term (current) use of inhaled steroids: Secondary | ICD-10-CM | POA: Insufficient documentation

## 2017-07-20 DIAGNOSIS — Z923 Personal history of irradiation: Secondary | ICD-10-CM | POA: Diagnosis not present

## 2017-07-20 DIAGNOSIS — Z79899 Other long term (current) drug therapy: Secondary | ICD-10-CM | POA: Insufficient documentation

## 2017-07-20 DIAGNOSIS — C3492 Malignant neoplasm of unspecified part of left bronchus or lung: Secondary | ICD-10-CM

## 2017-07-20 DIAGNOSIS — J449 Chronic obstructive pulmonary disease, unspecified: Secondary | ICD-10-CM | POA: Insufficient documentation

## 2017-07-20 DIAGNOSIS — Z510 Encounter for antineoplastic radiation therapy: Secondary | ICD-10-CM | POA: Insufficient documentation

## 2017-07-20 DIAGNOSIS — C250 Malignant neoplasm of head of pancreas: Secondary | ICD-10-CM | POA: Diagnosis not present

## 2017-07-20 DIAGNOSIS — Z8249 Family history of ischemic heart disease and other diseases of the circulatory system: Secondary | ICD-10-CM | POA: Insufficient documentation

## 2017-07-20 DIAGNOSIS — Z96641 Presence of right artificial hip joint: Secondary | ICD-10-CM | POA: Insufficient documentation

## 2017-07-20 DIAGNOSIS — Z87891 Personal history of nicotine dependence: Secondary | ICD-10-CM | POA: Insufficient documentation

## 2017-07-20 DIAGNOSIS — Z96652 Presence of left artificial knee joint: Secondary | ICD-10-CM | POA: Diagnosis not present

## 2017-07-20 HISTORY — DX: Malignant neoplasm of pancreas, unspecified: C25.9

## 2017-07-20 NOTE — Telephone Encounter (Signed)
Daughter called and has requested that patient son ,Carol Cook be called at [phone] when scheduling the fiducial markers for the patient,  Will let Bryson Ha and Enid Derry know 10:43 AM

## 2017-07-20 NOTE — Progress Notes (Signed)
Please see the Nurse Progress Note in the MD Initial Consult Encounter for this patient. 

## 2017-07-20 NOTE — Progress Notes (Signed)
Radiation Oncology         [phone] ________________________________  Name: Carol Cook        MRN: [id number]  Date of Service: 07/20/2017 DOB: 1927-06-25  CC:Jonathon Jordan, MD  Ladell Pier, MD     REFERRING PHYSICIAN: Ladell Pier, MD   DIAGNOSIS: The encounter diagnosis was Primary cancer of head of pancreas [facility]).   HISTORY OF PRESENT ILLNESS: Carol Cook is a 83 y.o. female seen at the request of Dr. Benay Spice for a new diagnosis of pancreatic cancer. The patient noted abdominal bloating in October 2018 and was jaundiced. She presented to [facility] on 06/25/17 and her liver enzymes and bilirubin were markedly elevated at this time. CT of the abdomen and pelvis revealed a lesion in the uncinate process suspicious for adenocarcinoma. MRI of the abdomen on 06/26/17 showed a mass involving the pancreas head and uncinate process abutting the superior mesenteric artery and abdominal aorta. No metastasis noted. She presented to [facility] and underwent an ERCP with stent placement on 07/01/17 with Dr. Delrae Alfred. An EUS biopsy of the pancreas mass confirmed adenocarcinoma, with a T3N0 tumor. The patient was seen by Dr. Benay Spice on 07/13/17. She is not a candidate for surgical resection, nor does she want to proceed with whipple. She is also not interested in systemic therapy. She presents today to discuss the role of steotactic radiation therapy.    PREVIOUS RADIATION THERAPY: Yes   05/01/15-05/07/15: 50 Gy in 5 fractions to the lung at [facility], she follows in surveillance with Dr. Lamonte Sakai.   PAST MEDICAL HISTORY:  Past Medical History:  Diagnosis Date  . COPD (chronic obstructive pulmonary disease) ([place])   . Non-small cell lung cancer (NSCLC) ([place])   . Pancreatic cancer ([place]) 07/01/2017   Adenocarcinoma  . Post-radiation pneumonitis ([place])        PAST SURGICAL HISTORY: Past Surgical History:  Procedure Laterality Date  . APPENDECTOMY    . cataracts     surgery  .  CHOLECYSTECTOMY    . REPLACEMENT TOTAL KNEE Left   . TOTAL HIP ARTHROPLASTY Right      FAMILY HISTORY:  Family History  Problem Relation Age of Onset  . Dementia Mother   . Hypertension Mother   . Deep vein thrombosis Father      SOCIAL HISTORY:  reports that she quit smoking about 5 years ago. Her smoking use included cigarettes. she has never used smokeless tobacco. She reports that she drinks alcohol. She reports that she does not use drugs. The patient is widowed and lives in [place]. She's originally from [place] but moved to be closer to her son. She lives independently but relies on her daughter to help with grocery shopping and transportation.   ALLERGIES: Patient has no known allergies.   MEDICATIONS:  Current Outpatient Medications  Medication Sig Dispense Refill  . cholecalciferol (VITAMIN D) 1000 units tablet Take 1,000 Units by mouth daily.    . fluticasone (FLONASE) 50 MCG/ACT nasal spray Place 2 sprays into both nostrils daily. (Patient taking differently: Place 1 spray into both nostrils as needed. ) 16 g 5  . Multiple Vitamins-Minerals (OPTIVITE PO) Take 1 capsule by mouth daily.    Marland Kitchen omeprazole (PRILOSEC OTC) 20 MG tablet Take 20 mg by mouth daily.    Marland Kitchen OVER THE COUNTER MEDICATION Take 1 capsule by mouth at bedtime. Sleep aid otc    . prednisoLONE acetate (PRED FORTE) 1 % ophthalmic suspension INT 1 GTT IN OD  D START ON mon. wed and friday  1  . PROAIR HFA 108 (90 Base) MCG/ACT inhaler INHALE 1 PUFF INTO LUNGS EVERY 4 HOURS AS NEEDED 8.5 g 3  . Tiotropium Bromide-Olodaterol (STIOLTO RESPIMAT) 2.5-2.5 MCG/ACT AERS Inhale 2 puffs into the lungs daily. 1 Inhaler 3  . vitamin C (ASCORBIC ACID) 500 MG tablet Take 500 mg by mouth daily.    . vitamin E 400 UNIT capsule Take 400 Units by mouth daily.     No current facility-administered medications for this encounter.      REVIEW OF SYSTEMS: On review of systems, the patient reports that she is doing well overall.  She reports a 30 lb weight loss and loss of appetite in the last 6 months. She also notes generalized pain to the upper, and mid abdomen. She denies any postprandial pain, chest pain, shortness of breath, cough, fevers, chills, night sweats. She denies any bowel or bladder disturbances, nausea or vomiting. She denies any new musculoskeletal or joint aches or pains. A complete review of systems is obtained and is otherwise negative.     PHYSICAL EXAM:  Wt Readings from Last 3 Encounters:  07/20/17 159 lb 9.6 oz (72.4 kg)  07/13/17 161 lb 11.2 oz (73.3 kg)  06/26/17 164 lb 14.5 oz (74.8 kg)   Temp Readings from Last 3 Encounters:  07/20/17 97.8 F (36.6 C) (Oral)  07/13/17 98 F (36.7 C) (Oral)  06/28/17 98.2 F (36.8 C) (Oral)   BP Readings from Last 3 Encounters:  07/20/17 (!) 141/85  07/13/17 (!) 150/83  06/28/17 127/68   Pulse Readings from Last 3 Encounters:  07/20/17 78  07/13/17 70  06/28/17 73   Pain Assessment Pain Score: 2 /10  In general this is a well appearing caucasian woman in no acute distress. She is alert and oriented x4 and appropriate throughout the examination. HEENT reveals that the patient is normocephalic, atraumatic. EOMs are intact. PERRLA. Skin is intact without any evidence of gross lesions. Cardiovascular exam reveals a regular rate and rhythm, no clicks rubs or murmurs are auscultated. Chest is clear to auscultation bilaterally. Lymphatic assessment is performed and does not reveal any adenopathy in the cervical, supraclavicular, axillary, or inguinal chains. Abdomen has active bowel sounds in all quadrants and is intact. The abdomen is soft, non tender, non distended. Lower extremities are negative for pretibial pitting edema, deep calf tenderness, cyanosis or clubbing.   ECOG = 1  0 - Asymptomatic (Fully active, able to carry on all predisease activities without restriction)  1 - Symptomatic but completely ambulatory (Restricted in physically  strenuous activity but ambulatory and able to carry out work of a light or sedentary nature. For example, light housework, office work)  2 - Symptomatic, <50% in bed during the day (Ambulatory and capable of all self care but unable to carry out any work activities. Up and about more than 50% of waking hours)  3 - Symptomatic, >50% in bed, but not bedbound (Capable of only limited self-care, confined to bed or chair 50% or more of waking hours)  4 - Bedbound (Completely disabled. Cannot carry on any self-care. Totally confined to bed or chair)  5 - Death   Eustace Pen MM, Creech RH, Tormey DC, et al. [phone]). "Toxicity and response criteria of the [facility] [employer]". [place] Oncol. 5 (6): 649-55    LABORATORY DATA:  Lab Results  Component Value Date   WBC 4.5 06/28/2017   HGB 9.4 (L) 06/28/2017  HCT 26.8 (L) 06/28/2017   MCV 89.0 06/28/2017   PLT  06/28/2017    PLATELET CLUMPS NOTED ON SMEAR, COUNT APPEARS ADEQUATE   Lab Results  Component Value Date   NA 133 (L) 06/28/2017   K 3.2 (L) 06/28/2017   CL 100 (L) 06/28/2017   CO2 26 06/28/2017   Lab Results  Component Value Date   ALT 329 (H) 06/28/2017   AST 306 (H) 06/28/2017   ALKPHOS 611 (H) 06/28/2017   BILITOT 10.0 (H) 06/28/2017      RADIOGRAPHY: Ct Abdomen Pelvis W Contrast  Result Date: 06/25/2017 CLINICAL DATA:  83 year old female with painless jaundice and weight loss and fatigue. History of left lower lobe lung adenocarcinoma status post radiation treatment in September 2016. EXAM: CT ABDOMEN AND PELVIS WITH CONTRAST TECHNIQUE: Multidetector CT imaging of the abdomen and pelvis was performed using the standard protocol following bolus administration of intravenous contrast. CONTRAST:  ISOVUE-300 IOPAMIDOL (ISOVUE-300) INJECTION 61% COMPARISON:  Chest CT dated 04/20/2017 and 04/20/2016 FINDINGS: Evaluation is limited due to streak artifact caused by patient's arms and metallic right hip arthroplasty.  Lower chest: There is subpleural emphysematous changes of the lung bases posteriorly. Three vessel coronary vascular atherosclerosis. There is no intra-abdominal free air or free fluid. Hepatobiliary: The liver is unremarkable. There is diffuse dilatation of the intrahepatic biliary trees as well as diffuse dilatation of the common bile duct. The common bile duct measures up to 2.8 cm. This findings are new since the study of 04/20/2017. Pancreas: There is an ill-defined hypoenhancing soft tissue in the mid medial uncinate process of the pancreas measuring approximately 3.5 x 3.2 cm. There is abutment, and possible complete encasement, of the SMA with loss of the fat plane. Findings highly suspicious for primary pancreatic malignancy such as adenocarcinoma. Further evaluation with MRI, pancreatic for local/MRCP recommended. There is no dilatation of the pancreatic duct. Spleen: Normal in size without focal abnormality. Adrenals/Urinary Tract: The adrenal glands are unremarkable. There is a 3 cm left renal inferior pole cyst. Subcentimeter right renal inferior pole hypodensity is too small to characterize. An area of hypodensity in the inferior pole of the right kidney posteriorly noted which also likely represents a cyst. There is no hydronephrosis on either side. The urinary bladder is grossly unremarkable but suboptimally visualized due to streak artifact caused by right hip arthroplasty. Stomach/Bowel: There is a large hiatal hernia. There is no bowel obstruction or active inflammation. There is moderate amount of stool within the colon. Appendectomy. Vascular/Lymphatic: There is advanced aortoiliac atherosclerotic disease. There is a 2.4 cm ectasia of infrarenal abdominal aorta. No portal venous gas identified. No adenopathy. Reproductive: The uterus is poorly visualized. Other: None Musculoskeletal: Osteopenia with extensive degenerative changes of the spine. Multilevel disc desiccation and vacuum phenomena.  Total right hip arthroplasty. No acute osseous pathology. IMPRESSION: 1. Ill-defined hypoenhancing lesion in the uncinate process of the pancreas highly suspicious for pancreatic adenocarcinoma. Further evaluation with MRI/MRCP recommended. 2. Dilatation of the intrahepatic and intrahepatic biliary trees, not seen on the prior chest CT of 04/20/2017. 3. Apparent abutment or possibly encasement of the SMA by the pancreatic mass. 4. Large colonic stool burden. No bowel obstruction or active inflammation. 5. A 2.4 cm infrarenal aortic ectasia. 6.  Aortic Atherosclerosis (ICD10-I70.0). Electronically Signed   By: Anner Crete M.D.   On: 06/25/2017 22:26   Mr 3d Recon At Scanner  Result Date: 06/26/2017 CLINICAL DATA:  Painless jaundice with weight loss EXAM: MRI ABDOMEN WITHOUT AND WITH  CONTRAST (INCLUDING MRCP) TECHNIQUE: Multiplanar multisequence MR imaging of the abdomen was performed both before and after the administration of intravenous contrast. Heavily T2-weighted images of the biliary and pancreatic ducts were obtained, and three-dimensional MRCP images were rendered by post processing. CONTRAST:  14 cc MultiHance COMPARISON:  06/25/2017 FINDINGS: Despite efforts by the technologist and patient, motion artifact is present on today's exam and could not be eliminated. This reduces exam sensitivity and specificity. Lower chest: Large hiatal hernia.  Small left pleural effusion. Hepatobiliary: Severe intrahepatic and extrahepatic biliary dilatation extending down to the pancreatic mass. Truncation of the CBD in the vicinity of the mass. No focal hepatic metastatic lesions are identified. Pancreas: Abnormal low T1 signal and hypoenhancing mass in the head and uncinate process of the pancreas, highly indistinct due to the motion artifact and the infiltrative nature of the lesion, potentially up to 4.2 by 3.1 cm, with the mass at least partially encasing the superior mesenteric artery. Anatomic detail is much  better on the CT scan due to the lack of motion artifact and greater spatial resolution. The proper hepatic artery appears to arise from the celiac trunk-common hepatic artery as is conventional. The mass severely narrows the portal vein, nearly occluding it. , near the confluence of the splenic vein and SMV. The mass also abuts the left anterolateral margin of the abdominal aorta and of course the duodenum. Surprising lack of dorsal pancreatic duct dilatation. Low-grade peripancreatic edema. Spleen:  Unremarkable, the splenic vein currently remains patent. Adrenals/Urinary Tract: Left kidney lower pole Bosniak category 2 cyst, with a small rim of high precontrast T1 signal on image 61/1300. Minimal scarring in the right kidney lower pole. Stomach/Bowel: Prominent stool throughout the colon favors constipation. Vascular/Lymphatic: Aortoiliac atherosclerotic vascular disease. The relationship of the pancreatic mass with local vessels is ascribed above. No obvious separate adenopathy is appreciable Other:  No supplemental non-categorized findings. Musculoskeletal: Lumbar scoliosis, spondylosis, and degenerative disc disease. Old thoracic compression fractures. Right hip implant. IMPRESSION: 1. Infiltrative mass compatible with adenocarcinoma involving the pancreatic head and uncinate process, abutting significant portions of the superior mesenteric artery and abdominal aorta, and compressing but not totally occluding the confluence of the splenic vein and SMV. Anatomic detail is generally better on the recent CT scan due to the lack of motion artifact and better spatial resolution. Faint peripancreatic edema could be from pancreatitis but the dorsal pancreatic duct is not dilated. 2. Prominent biliary dilatation due to malignant obstruction by the pancreatic mass. I do not see discrete hepatic metastatic lesions. 3. Large hiatal hernia. 4. Small pleural effusion. 5. Bosniak category 2 benign left renal cyst. Minimal  scarring in the right kidney lower pole. 6.  Prominent stool throughout the colon favors constipation. 7.  Aortic Atherosclerosis (ICD10-I70.0). 8. Lumbar spondylosis, scoliosis, and degenerative disc disease. Thoracic compression fractures. Electronically Signed   By: Van Clines M.D.   On: 06/26/2017 17:55   Mr Abdomen Mrcp Moise Boring Contast  Result Date: 06/26/2017 CLINICAL DATA:  Painless jaundice with weight loss EXAM: MRI ABDOMEN WITHOUT AND WITH CONTRAST (INCLUDING MRCP) TECHNIQUE: Multiplanar multisequence MR imaging of the abdomen was performed both before and after the administration of intravenous contrast. Heavily T2-weighted images of the biliary and pancreatic ducts were obtained, and three-dimensional MRCP images were rendered by post processing. CONTRAST:  14 cc MultiHance COMPARISON:  06/25/2017 FINDINGS: Despite efforts by the technologist and patient, motion artifact is present on today's exam and could not be eliminated. This reduces exam sensitivity and  specificity. Lower chest: Large hiatal hernia.  Small left pleural effusion. Hepatobiliary: Severe intrahepatic and extrahepatic biliary dilatation extending down to the pancreatic mass. Truncation of the CBD in the vicinity of the mass. No focal hepatic metastatic lesions are identified. Pancreas: Abnormal low T1 signal and hypoenhancing mass in the head and uncinate process of the pancreas, highly indistinct due to the motion artifact and the infiltrative nature of the lesion, potentially up to 4.2 by 3.1 cm, with the mass at least partially encasing the superior mesenteric artery. Anatomic detail is much better on the CT scan due to the lack of motion artifact and greater spatial resolution. The proper hepatic artery appears to arise from the celiac trunk-common hepatic artery as is conventional. The mass severely narrows the portal vein, nearly occluding it. , near the confluence of the splenic vein and SMV. The mass also abuts the left  anterolateral margin of the abdominal aorta and of course the duodenum. Surprising lack of dorsal pancreatic duct dilatation. Low-grade peripancreatic edema. Spleen:  Unremarkable, the splenic vein currently remains patent. Adrenals/Urinary Tract: Left kidney lower pole Bosniak category 2 cyst, with a small rim of high precontrast T1 signal on image 61/1300. Minimal scarring in the right kidney lower pole. Stomach/Bowel: Prominent stool throughout the colon favors constipation. Vascular/Lymphatic: Aortoiliac atherosclerotic vascular disease. The relationship of the pancreatic mass with local vessels is ascribed above. No obvious separate adenopathy is appreciable Other:  No supplemental non-categorized findings. Musculoskeletal: Lumbar scoliosis, spondylosis, and degenerative disc disease. Old thoracic compression fractures. Right hip implant. IMPRESSION: 1. Infiltrative mass compatible with adenocarcinoma involving the pancreatic head and uncinate process, abutting significant portions of the superior mesenteric artery and abdominal aorta, and compressing but not totally occluding the confluence of the splenic vein and SMV. Anatomic detail is generally better on the recent CT scan due to the lack of motion artifact and better spatial resolution. Faint peripancreatic edema could be from pancreatitis but the dorsal pancreatic duct is not dilated. 2. Prominent biliary dilatation due to malignant obstruction by the pancreatic mass. I do not see discrete hepatic metastatic lesions. 3. Large hiatal hernia. 4. Small pleural effusion. 5. Bosniak category 2 benign left renal cyst. Minimal scarring in the right kidney lower pole. 6.  Prominent stool throughout the colon favors constipation. 7.  Aortic Atherosclerosis (ICD10-I70.0). 8. Lumbar spondylosis, scoliosis, and degenerative disc disease. Thoracic compression fractures. Electronically Signed   By: Van Clines M.D.   On: 06/26/2017 17:55        IMPRESSION/PLAN: 1. Stage IIA, cT3N0M0 adenocarcinoma of the head of the pancreas. Dr. Lisbeth Renshaw discusses the pathology findings and reviews the nature of pancreatic cancer. He reviews the rationale for patients who only locally advanced without nodal involvement who elect not to proceed surgically or who are borderline resectable with good performance status could consider Chemo, versus ChemoRT, versus SBRT. She is not interested in combination therapy with chemotherapy. We reviewed the role of SBRT, and she is interested. We discussed the risks, benefits, short, and long term effects of radiotherapy. The patient appears to understand and wishes to proceed. We discussed the logistics of having fiduical marker placement with Dr. Delrae Alfred.  A consent form was signed and a copy was retained for our records. Dr. Lisbeth Renshaw discussed the delivery and logistics of radiotherapy and anticipates 5 fractions. She will proceed with gold fiducial placement at [facility] prior to simulation which we will coordinate once we know the date of her fiducial marker placement. 2. Stage IB, T2aN0M0  NSCLC, of the left lung. The patient will continue to follow up with Dr. Lamonte Sakai in pulmonology for surveillance of her lung cancer.   The above documentation reflects my direct findings during this shared patient visit. Please see the separate note by Dr. Lisbeth Renshaw on this date for the remainder of the patient's plan of care.    Carola Rhine, PAC  This document serves as a record of services personally performed by Kyung Rudd, MD and Shona Simpson, PA-C. It was created on their behalf by Bethann Humble, a trained medical scribe. The creation of this record is based on the scribe's personal observations and the provider's statements to them. This document has been checked and approved by the attending provider.

## 2017-07-20 NOTE — Telephone Encounter (Signed)
Thanks I let Ila know

## 2017-07-22 ENCOUNTER — Encounter: Payer: Self-pay | Admitting: General Practice

## 2017-07-22 ENCOUNTER — Telehealth: Payer: Self-pay | Admitting: Radiation Oncology

## 2017-07-22 NOTE — Progress Notes (Signed)
[place] Psychosocial Distress Screening Clinical Social Work  Clinical Social Work was referred by distress screening protocol.  The patient scored a 5 on the Psychosocial Distress Thermometer which indicates moderate distress. Clinical Social Worker Edwyna Shell to assess for distress and other psychosocial needs. CSW and patient discussed common feeling and emotions when being diagnosed with cancer, and the importance of support during treatment. CSW informed patient of the support team and support services at [facility]. CSW provided contact information and encouraged patient to call with any questions or concerns.  Patient expressed that she had no immediate needs/concerns - son provides transportation, lives alone but daughter lives across the stress and can assist as needed.  "I am just waiting for my next appointment at [facility]."  CSW encouraged patient to recontact CSW if needs arise in future.    ONCBCN DISTRESS SCREENING 07/20/2017  Screening Type Initial Screening  Distress experienced in past week (1-10) 5  Emotional problem type   Information Concerns Type Lack of info about treatment;Lack of info about complementary therapy choices  Physical Problem type Pain;Swollen arms/legs;Skin dry/itchy;Sleep/insomnia;Constipation/diarrhea;Loss of appetitie  Physician notified of physical symptoms   Referral to clinical social work Yes  Referral to dietition      Clinical Social Worker follow up needed: No.  If yes, follow up plan:   Edwyna Shell, LCSW Clinical Social Worker Phone:  [phone]

## 2017-07-22 NOTE — Telephone Encounter (Signed)
I spoke with the patient about coming in for simulation next Thursday following placement of the fiducial markers at [facility] on Monday.

## 2017-07-26 DIAGNOSIS — Z85118 Personal history of other malignant neoplasm of bronchus and lung: Secondary | ICD-10-CM | POA: Diagnosis not present

## 2017-07-26 DIAGNOSIS — J439 Emphysema, unspecified: Secondary | ICD-10-CM | POA: Diagnosis not present

## 2017-07-26 DIAGNOSIS — C250 Malignant neoplasm of head of pancreas: Secondary | ICD-10-CM | POA: Diagnosis not present

## 2017-07-26 DIAGNOSIS — C259 Malignant neoplasm of pancreas, unspecified: Secondary | ICD-10-CM | POA: Diagnosis not present

## 2017-07-26 DIAGNOSIS — Z9689 Presence of other specified functional implants: Secondary | ICD-10-CM | POA: Diagnosis not present

## 2017-07-26 DIAGNOSIS — Z87891 Personal history of nicotine dependence: Secondary | ICD-10-CM | POA: Diagnosis not present

## 2017-07-26 DIAGNOSIS — Z79899 Other long term (current) drug therapy: Secondary | ICD-10-CM | POA: Diagnosis not present

## 2017-07-26 DIAGNOSIS — Z923 Personal history of irradiation: Secondary | ICD-10-CM | POA: Diagnosis not present

## 2017-07-26 DIAGNOSIS — Z9049 Acquired absence of other specified parts of digestive tract: Secondary | ICD-10-CM | POA: Diagnosis not present

## 2017-07-28 ENCOUNTER — Telehealth: Payer: Self-pay | Admitting: Oncology

## 2017-07-28 NOTE — Telephone Encounter (Signed)
Spoke with patient re appt. Schedule mailed

## 2017-07-29 ENCOUNTER — Ambulatory Visit
Admission: RE | Admit: 2017-07-29 | Discharge: 2017-07-29 | Disposition: A | Discharge status: Home / Self Care | Payer: PPO | Source: Ambulatory Visit | Attending: Radiation Oncology | Admitting: Radiation Oncology

## 2017-07-29 DIAGNOSIS — C250 Malignant neoplasm of head of pancreas: Secondary | ICD-10-CM

## 2017-07-29 DIAGNOSIS — Z510 Encounter for antineoplastic radiation therapy: Secondary | ICD-10-CM | POA: Diagnosis not present

## 2017-07-29 MED ORDER — SODIUM CHLORIDE 0.9% FLUSH
10.0000 mL | INTRAVENOUS | Status: DC | PRN
  Administered 2017-07-29: 10 mL via INTRAVENOUS

## 2017-07-29 NOTE — Progress Notes (Addendum)
Has armband been applied?  yes  Does patient have an allergy to IV contrast dye?: No.   Has patient ever received premedication for IV contrast dye?: No.   Does patient take metformin?: No.  If patient does take metformin when was the last dose: N/A  Date of lab work: 06/28/17 BUN: 5 CR: 0.55 Egfr=>60  IV site: antecubital right, condition patent and no redness D/c iv catheter rac, all intact,tip visible, 4x4 Gause applied with mediopre tape secured 2:56 PM   Has IV site been added to flowsheet?  Yes.    There were no vitals taken for this visit.

## 2017-08-04 DIAGNOSIS — Z510 Encounter for antineoplastic radiation therapy: Secondary | ICD-10-CM | POA: Diagnosis not present

## 2017-08-04 DIAGNOSIS — C250 Malignant neoplasm of head of pancreas: Secondary | ICD-10-CM | POA: Diagnosis not present

## 2017-08-05 ENCOUNTER — Ambulatory Visit
Admission: RE | Admit: 2017-08-05 | Discharge: 2017-08-05 | Disposition: A | Discharge status: Home / Self Care | Payer: PPO | Source: Ambulatory Visit | Attending: Radiation Oncology | Admitting: Radiation Oncology

## 2017-08-05 DIAGNOSIS — Z510 Encounter for antineoplastic radiation therapy: Secondary | ICD-10-CM | POA: Diagnosis not present

## 2017-08-06 ENCOUNTER — Ambulatory Visit: Payer: PPO

## 2017-08-09 ENCOUNTER — Telehealth: Payer: Self-pay

## 2017-08-09 ENCOUNTER — Telehealth: Payer: Self-pay | Admitting: *Deleted

## 2017-08-09 ENCOUNTER — Ambulatory Visit
Admission: RE | Admit: 2017-08-09 | Discharge: 2017-08-09 | Disposition: A | Discharge status: Home / Self Care | Payer: PPO | Source: Ambulatory Visit | Attending: Radiation Oncology | Admitting: Radiation Oncology

## 2017-08-09 ENCOUNTER — Ambulatory Visit: Payer: PPO

## 2017-08-09 ENCOUNTER — Ambulatory Visit: Admission: RE | Admit: 2017-08-09 | Payer: PPO | Source: Ambulatory Visit | Admitting: Radiation Oncology

## 2017-08-09 DIAGNOSIS — Z510 Encounter for antineoplastic radiation therapy: Secondary | ICD-10-CM | POA: Diagnosis not present

## 2017-08-09 NOTE — Telephone Encounter (Signed)
Received note from GI navigator that patient reported trouble sleeping. Consulted Dr. Benay Spice and instructed patient to consult PCP and try Tylenol PM. Called patient and left voicemail.

## 2017-08-09 NOTE — Telephone Encounter (Signed)
Message to schedulers.

## 2017-08-09 NOTE — Telephone Encounter (Signed)
Schedule office visit for week of 08/30/2016 or 09/06/2016

## 2017-08-09 NOTE — Telephone Encounter (Signed)
Call from pt requesting to cancel 12/20 appt. She wants to be rescheduled to after Christmas. She reports being "worn out" by all the appointments. She declined office visit same day as radiation treatment. Will review with MD for appt.

## 2017-08-10 ENCOUNTER — Ambulatory Visit: Payer: PPO | Admitting: Radiation Oncology

## 2017-08-10 ENCOUNTER — Telehealth: Payer: Self-pay | Admitting: *Deleted

## 2017-08-10 ENCOUNTER — Telehealth: Payer: Self-pay | Admitting: Oncology

## 2017-08-10 NOTE — Telephone Encounter (Signed)
error 

## 2017-08-10 NOTE — Telephone Encounter (Signed)
Left message on voicemail regarding appt for 09/06/17

## 2017-08-11 ENCOUNTER — Ambulatory Visit
Admission: RE | Admit: 2017-08-11 | Discharge: 2017-08-11 | Disposition: A | Discharge status: Home / Self Care | Payer: PPO | Source: Ambulatory Visit | Attending: Radiation Oncology | Admitting: Radiation Oncology

## 2017-08-11 ENCOUNTER — Telehealth: Payer: Self-pay | Admitting: Oncology

## 2017-08-11 DIAGNOSIS — Z510 Encounter for antineoplastic radiation therapy: Secondary | ICD-10-CM | POA: Diagnosis not present

## 2017-08-11 NOTE — Telephone Encounter (Signed)
Scheduled appt per 12/18 sch message - patient is aware of appt date and time.

## 2017-08-12 ENCOUNTER — Ambulatory Visit: Payer: PPO | Admitting: Oncology

## 2017-08-12 ENCOUNTER — Ambulatory Visit: Payer: PPO | Admitting: Radiation Oncology

## 2017-08-12 ENCOUNTER — Other Ambulatory Visit: Payer: PPO

## 2017-08-13 ENCOUNTER — Ambulatory Visit
Admission: RE | Admit: 2017-08-13 | Discharge: 2017-08-13 | Disposition: A | Discharge status: Home / Self Care | Payer: PPO | Source: Ambulatory Visit | Attending: Radiation Oncology | Admitting: Radiation Oncology

## 2017-08-13 DIAGNOSIS — Z510 Encounter for antineoplastic radiation therapy: Secondary | ICD-10-CM | POA: Diagnosis not present

## 2017-08-16 ENCOUNTER — Ambulatory Visit
Admission: RE | Admit: 2017-08-16 | Discharge: 2017-08-16 | Disposition: A | Discharge status: Home / Self Care | Payer: PPO | Source: Ambulatory Visit | Attending: Radiation Oncology | Admitting: Radiation Oncology

## 2017-08-16 ENCOUNTER — Encounter: Payer: Self-pay | Admitting: Radiation Oncology

## 2017-08-16 DIAGNOSIS — C250 Malignant neoplasm of head of pancreas: Secondary | ICD-10-CM | POA: Diagnosis not present

## 2017-08-16 DIAGNOSIS — Z510 Encounter for antineoplastic radiation therapy: Secondary | ICD-10-CM | POA: Diagnosis not present

## 2017-08-16 DIAGNOSIS — F5102 Adjustment insomnia: Secondary | ICD-10-CM

## 2017-08-16 MED ORDER — TEMAZEPAM 15 MG PO CAPS: 15.0000 mg | ORAL_CAPSULE | Freq: Every evening | ORAL | 2 refills | Status: DC | PRN

## 2017-08-16 NOTE — Progress Notes (Signed)
Radiation Oncology         [phone] ________________________________  Name: Carol Cook MRN: [id number]  Date of Service: 08/16/2017 DOB: October 02, 1926  Post Treatment Note  CC: Jonathon Jordan, MD  Ladell Pier, MD  Diagnosis:    Stage IIA, cT3N0M0 adenocarcinoma of the head of the pancreas  Interval Since Last Radiation:   08/05/17-08/16/17 SBRT Treatment: The pancreatic head was treated to 33 Gy in 5 fractions   Narrative:  The patient returns today for routine follow-up after completing her last treatment to the pancreas.                              On review of systems, the patient states she is doing pretty well overall but has been seen.  She reports that seems to have altered her ability to sleep.  She is not napping in the daytime, but finds that she can fall asleep and sleep for 1-2 hours at the time and then is up every hour.  She reports that this is not due to waking up to go to the bathroom or due to her sleep hygiene.  Denies any feelings of worry, and reports that she is optimistic about her cancer diagnosis.  She reports that she is lonely and was unable to care for a PET around the time of her diagnosis, and is hopeful to try and engage in companionship with others and perhaps animals.  No other complaints are verbalized.  ALLERGIES:  has No Known Allergies.  Meds: Current Outpatient Medications  Medication Sig Dispense Refill  . cholecalciferol (VITAMIN D) 1000 units tablet Take 1,000 Units by mouth daily.    . fluticasone (FLONASE) 50 MCG/ACT nasal spray Place 2 sprays into both nostrils daily. (Patient taking differently: Place 1 spray into both nostrils as needed. ) 16 g 5  . Multiple Vitamins-Minerals (OPTIVITE PO) Take 1 capsule by mouth daily.    Marland Kitchen omeprazole (PRILOSEC OTC) 20 MG tablet Take 20 mg by mouth daily.    Marland Kitchen OVER THE COUNTER MEDICATION Take 1 capsule by mouth at bedtime. Sleep aid otc    . prednisoLONE acetate (PRED FORTE) 1 % ophthalmic  suspension INT 1 GTT IN OD D START ON mon. wed and friday  1  . PROAIR HFA 108 (90 Base) MCG/ACT inhaler INHALE 1 PUFF INTO LUNGS EVERY 4 HOURS AS NEEDED 8.5 g 3  . temazepam (RESTORIL) 15 MG capsule Take 1 capsule (15 mg total) by mouth at bedtime as needed for sleep. 30 capsule 2  . Tiotropium Bromide-Olodaterol (STIOLTO RESPIMAT) 2.5-2.5 MCG/ACT AERS Inhale 2 puffs into the lungs daily. 1 Inhaler 3  . vitamin C (ASCORBIC ACID) 500 MG tablet Take 500 mg by mouth daily.    . vitamin E 400 UNIT capsule Take 400 Units by mouth daily.     No current facility-administered medications for this encounter.     Physical Findings: See vitals in [place]. Afebrile with stable vitals. In general this is a well appearing, elderly Caucasian female in no acute distress. She's alert and oriented x4 and appropriate throughout the examination. Cardiopulmonary assessment is negative for acute distress and she exhibits normal effort.   Lab Findings: Lab Results  Component Value Date   WBC 4.5 06/28/2017   HGB 9.4 (L) 06/28/2017   HCT 26.8 (L) 06/28/2017   MCV 89.0 06/28/2017   PLT  06/28/2017    PLATELET CLUMPS NOTED ON SMEAR, COUNT APPEARS  ADEQUATE     Radiographic Findings: No results found.  Impression/Plan: 1. Stage IIA, cT3N0M0 adenocarcinoma of the head of the pancreas.  The patient is doing well since completion of radiotherapy today.  She is not having any symptoms other than tiredness and fatigue but I feel are related to her treatment.  We anticipate that she will noticed improved ability to tolerate more volume when eating in the coming weeks.  She will continue to follow-up with Dr. Benay Spice in January 2019, and keep [place] informed of any questions or concerns she has regarding radiation.  We did discuss that if she had any concerns that I would be happy to see her again, otherwise I will continue to follow her case peripherally. 2. Insomnia.  The patient has tried over-the-counter sleep aids  including Tylenol PM, and has also failed 10 mg melatonin.  We discussed the risks and benefits associated with other base sleep aids.  She is willing to consider these risks would like to still give them a try.  We discussed the use of Restoril nightly, and counseled her on the potential for neurologic symptoms as a result including confusion she will keep me informed of her progress. 3. Desires to change PCP.  The patient is interested in changing her primary care provider, she requests a referral.  I will send her to see Dr. Ardeth Perfect in internal medicine for help with managing her insomnia as well as her other comorbidities. 4. Loneliness.  I will contact her social worker to see if there are any senior services that she is eligible for.    Carola Rhine, PAC

## 2017-08-18 NOTE — Progress Notes (Signed)
  Radiation Oncology         [phone] ________________________________  Name: Kanyon Bunn MRN: [id number]  Date: 08/16/2017  DOB: [birth year]  End of Treatment Note  Diagnosis:   Stage IIA, cT3N0M0 adenocarcinoma of thee head of the pancreas    Indication for treatment:  Curative        Radiation treatment dates:   08/05/17 - 08/16/17  Site/dose:   Pancreas // 33 Gy in 5 fx  Beams/energy:   Photon // SBRT/SRT 3D  Narrative: The patient tolerated radiation treatment relatively well.  She reported having mild fatigue and tiredness.   Plan: The patient has completed radiation treatment. The patient will return to radiation oncology clinic for routine followup in one month. I advised them to call or return sooner if they have any questions or concerns related to their recovery or treatment.  ------------------------------------------------  Jodelle Gross, MD, PhD  This document serves as a record of services personally performed by Kyung Rudd MD. It was created on his behalf by Delton Coombes, a trained medical scribe. The creation of this record is based on the scribe's personal observations and the provider's statements to them.

## 2017-08-19 ENCOUNTER — Telehealth: Payer: Self-pay | Admitting: Medical Oncology

## 2017-08-19 NOTE — Progress Notes (Signed)
[facility] Simulation and Treatment Planning Note   Name:  Lynnett Langlinais MRN: [id number]   Date: 08/19/2017  DOB: [birth year]  Status:outpatient    DIAGNOSIS:    ICD-10-CM   1. Primary cancer of head of pancreas ([place]) C25.0      CONSENT VERIFIED:yes   SET UP: Patient is setup supine   IMMOBILIZATION: The patient was immobilized using a Vac Loc bag and a customized accuform device   NARRATIVE:The patient was brought to the [place] [facility].  Identity was confirmed.  All relevant records and images related to the planned course of therapy were reviewed.  Then, the patient was positioned in a stable reproducible clinical set-up for radiation therapy. Abdominal compression was applied .  4D CT images were obtained and reproducible breathing pattern was confirmed. Free breathing CT images were obtained.  Skin markings were placed.  The CT images were loaded into the planning software where the target and avoidance structures were contoured.  The radiation prescription was entered and confirmed.    TREATMENT PLANNING NOTE:  Treatment planning then occurred. I have requested : MLC's, isodose plan, basic dose calculation.  3 dimensional simulation is performed and dose volume histogram of the gross tumor volume, planning tumor volume and criticial normal structures including the spinal cord and lungs were analyzed and requested.  Special treatment procedure was performed due to high dose per fraction.  The patient will be monitored for increased risk of toxicity.  Daily imaging using cone beam CT will be used for target localization.  I anticipate that the patient will receive 33 Gy in 5 fractions to target volume. Further adjustments will be made based on the planning process is necessary.  ------------------------------------------------  Jodelle Gross, MD, PhD

## 2017-08-19 NOTE — Telephone Encounter (Signed)
She should see GI at [facility] or here for jaundice

## 2017-08-19 NOTE — Telephone Encounter (Signed)
Dtr called to report her mother "looks jaundiced today ,she was not jaundiced yesterday .  Stent placed at [facility].  "She is eating and had a BM today. Abd pain mild but resolved after BM today." F/u appt jan 7th. I instructed dtr to monitor her for now and I will cc [place].

## 2017-08-19 NOTE — Telephone Encounter (Signed)
Per Lattie Haw  I instructed dtr to contact Dr Delrae Alfred at [facility] re: jaundice. I gave her his and his nurses phone number. I also instructed her to take pt to ED if she develops fever, shaking chills.She voiced understanding.

## 2017-08-23 NOTE — Telephone Encounter (Signed)
I called dtr and she said " I jumped the gun. Her color got better as the day progressed. She is doing fine'. Dtr  did not call [facility] and said pt  will keep appt jan 7th and call back if she has further concerned.

## 2017-08-25 ENCOUNTER — Telehealth: Payer: Self-pay | Admitting: Nurse Practitioner

## 2017-08-25 NOTE — Telephone Encounter (Signed)
Scheduled appt per 1/2 sch message - Left message with appt date and time.

## 2017-08-30 ENCOUNTER — Inpatient Hospital Stay: Payer: PPO

## 2017-08-30 ENCOUNTER — Encounter: Payer: Self-pay | Admitting: Nurse Practitioner

## 2017-08-30 ENCOUNTER — Encounter: Payer: Self-pay | Admitting: *Deleted

## 2017-08-30 ENCOUNTER — Telehealth: Payer: Self-pay | Admitting: Oncology

## 2017-08-30 ENCOUNTER — Inpatient Hospital Stay: Payer: PPO | Attending: Oncology | Admitting: Nurse Practitioner

## 2017-08-30 VITALS — BP 135/77 | HR 86 | Temp 98.2°F | Resp 18 | Ht 62.0 in | Wt 158.6 lb

## 2017-08-30 DIAGNOSIS — J90 Pleural effusion, not elsewhere classified: Secondary | ICD-10-CM | POA: Insufficient documentation

## 2017-08-30 DIAGNOSIS — C250 Malignant neoplasm of head of pancreas: Secondary | ICD-10-CM | POA: Insufficient documentation

## 2017-08-30 DIAGNOSIS — K5900 Constipation, unspecified: Secondary | ICD-10-CM | POA: Insufficient documentation

## 2017-08-30 DIAGNOSIS — J449 Chronic obstructive pulmonary disease, unspecified: Secondary | ICD-10-CM | POA: Insufficient documentation

## 2017-08-30 DIAGNOSIS — Z79899 Other long term (current) drug therapy: Secondary | ICD-10-CM | POA: Diagnosis not present

## 2017-08-30 DIAGNOSIS — C3412 Malignant neoplasm of upper lobe, left bronchus or lung: Secondary | ICD-10-CM | POA: Insufficient documentation

## 2017-08-30 DIAGNOSIS — D649 Anemia, unspecified: Secondary | ICD-10-CM | POA: Insufficient documentation

## 2017-08-30 DIAGNOSIS — K831 Obstruction of bile duct: Secondary | ICD-10-CM | POA: Insufficient documentation

## 2017-08-30 LAB — CBC WITH DIFFERENTIAL/PLATELET
ABS GRANULOCYTE: 3 10*3/uL (ref 1.5–6.5)
BASOS ABS: 0 10*3/uL (ref 0.0–0.1)
BASOS PCT: 1 %
EOS ABS: 0.1 10*3/uL (ref 0.0–0.5)
Eosinophils Relative: 2 %
HEMATOCRIT: 33.1 % — AB (ref 34.8–46.6)
HEMOGLOBIN: 11 g/dL — AB (ref 11.6–15.9)
LYMPHS ABS: 0.8 10*3/uL — AB (ref 0.9–3.3)
Lymphocytes Relative: 18 %
MCH: 30.8 pg (ref 25.1–34.0)
MCHC: 33.3 g/dL (ref 31.5–36.0)
MCV: 92.4 fL (ref 79.5–101.0)
Monocytes Absolute: 0.4 10*3/uL (ref 0.1–0.9)
Monocytes Relative: 9 %
NEUTROS ABS: 3 10*3/uL (ref 1.5–6.5)
NEUTROS PCT: 70 %
Platelets: 205 10*3/uL (ref 145–400)
RBC: 3.58 MIL/uL — ABNORMAL LOW (ref 3.70–5.45)
RDW: 13.9 % (ref 11.2–16.1)
WBC: 4.3 10*3/uL (ref 3.9–10.3)

## 2017-08-30 MED ORDER — PANTOPRAZOLE SODIUM 40 MG PO TBEC
40.0000 mg | DELAYED_RELEASE_TABLET | Freq: Every day | ORAL | 1 refills | Status: DC
Start: 2017-08-30 — End: 2017-10-25

## 2017-08-30 NOTE — Progress Notes (Signed)
[place] Work  Holiday representative received referral from APP for loneliness.  CSW met with patient and patients son to offer support and review resources.  CSW and patient discussed Tax adviser and the different services they provide.  Patients son also requested information on home care.  CSW provided education on home care and that it is not typically covered by insurance.  Patient has a supplemental Set designer.  CSW encouraged patient and family to call to see if there are additional benefits available.  Patient son plans to call insurance provider.  CSW provided contact information and encouraged patient and family to call with questions or concerns.     Johnnye Lana, MSW, LCSW, OSW-C Clinical Social Worker [facility] [phone]

## 2017-08-30 NOTE — Progress Notes (Addendum)
  [facility] OFFICE PROGRESS NOTE   Diagnosis: Pancreas cancer  INTERVAL HISTORY:   Carol Cook returns as scheduled.  She completed the course of radiation 08/16/2017.  Main complaint is fatigue.  She has mild abdominal pain after eating.  She describes this as an "ache".  She has been experiencing this since the stent was placed.  She also has occasional left-sided abdominal pain.  She does not require pain medication.  She has occasional nausea.  She has constipation.  She does not take a laxative or stool softener on a consistent basis.  Objective:  Vital signs in last 24 hours:  Blood pressure 135/77, pulse 86, temperature 98.2 F (36.8 C), temperature source Oral, resp. rate 18, height '5\' 2"'$  (1.575 m), weight 158 lb 9.6 oz (71.9 kg), SpO2 98 %.    HEENT: No thrush or ulcers. Lymphatics: No palpable cervical or supraclavicular lymph nodes. Resp: Lungs clear bilaterally. Cardio: Regular rate and rhythm. GI: Abdomen is soft and nontender.  No hepatomegaly.  No mass. Vascular: No leg edema.   Lab Results:  Lab Results  Component Value Date   WBC 4.3 08/30/2017   HGB 11.0 (L) 08/30/2017   HCT 33.1 (L) 08/30/2017   MCV 92.4 08/30/2017   PLT 205 08/30/2017   NEUTROABS 3.0 08/30/2017    Imaging:  No results found.  Medications: I have reviewed the patient's current medications.  Assessment/Plan: 1. Adenocarcinoma of the pancreas head/uncinate, status post an EUS biopsy at [facility] 02/28/2017 ? CT abdomen/pelvis 06/25/2018- uncinate mass, abutment of the superior mesenteric artery, biliary obstruction ? MRCP 06/26/2017- pancreas head/uncinate mass with abutment of the superior mesenteric artery and abdominal aorta, compression of the splenic vein and SMV, biliary dilatation, small left pleural effusion ? Cook 08/05/2017-08/16/2017   2. Obstructive jaundice secondary to #1, status post placement of a biliary stent at [facility] 07/01/2017  3.    COPD  4.   Non-small cell lung cancer, hypermetabolic left upper lobe mass, favor adenocarcinoma, EGFR, ALK , and ROS-1 negative              Cook at [facility], 50 Gy in 5 fractions 05/01/2015-05/07/2015  5.  Anemia-improved 08/30/2017  6.  Postprandial abdominal discomfort.  Trial of Protonix initiated 08/30/2017.    Disposition: Carol Cook appears stable.  She completed the course of Cook 08/16/2018.  We discussed obtaining a restaging CT scan at a 3-61-monthinterval from completion of radiation.  She and her family are in agreement with this plan.  For the postprandial abdominal discomfort she will begin a trial of Protonix.  She will return for labs and CT scans in approximately 3 months.  Follow-up visit 2-3 days later to review the results.  She will contact the office in the interim with any problems.  Patient seen with Dr. SBenay Spice    Carol Cook   08/30/2017  4:06 PM  This was a shared visit with Carol Cook  Carol Cook Cook to the pancreas a few weeks ago.  We discussed the expected toxicities from radiation with her.  She will try Protonix for the postprandial abdominal discomfort.  She and her family would like to proceed with a restaging CT evaluation to assess the response to therapy and look for evidence of disease progression.  She again indicated she does not wish to consider chemotherapy.  Carol Cook

## 2017-08-30 NOTE — Telephone Encounter (Signed)
Gave avs and calendar for march

## 2017-08-31 ENCOUNTER — Other Ambulatory Visit: Payer: Self-pay | Admitting: Emergency Medicine

## 2017-08-31 MED ORDER — TRAMADOL HCL 50 MG PO TABS: 50.0000 mg | ORAL_TABLET | Freq: Two times a day (BID) | ORAL | 0 refills | Status: DC | PRN

## 2017-09-02 DIAGNOSIS — H35033 Hypertensive retinopathy, bilateral: Secondary | ICD-10-CM | POA: Diagnosis not present

## 2017-09-02 DIAGNOSIS — H353134 Nonexudative age-related macular degeneration, bilateral, advanced atrophic with subfoveal involvement: Secondary | ICD-10-CM | POA: Diagnosis not present

## 2017-09-02 DIAGNOSIS — H43813 Vitreous degeneration, bilateral: Secondary | ICD-10-CM | POA: Diagnosis not present

## 2017-09-02 DIAGNOSIS — H35423 Microcystoid degeneration of retina, bilateral: Secondary | ICD-10-CM | POA: Diagnosis not present

## 2017-09-06 ENCOUNTER — Ambulatory Visit: Payer: PPO | Admitting: Oncology

## 2017-09-07 ENCOUNTER — Ambulatory Visit: Payer: PPO | Admitting: Oncology

## 2017-09-15 ENCOUNTER — Telehealth: Payer: Self-pay | Admitting: Emergency Medicine

## 2017-09-15 MED ORDER — TIOTROPIUM BROMIDE-OLODATEROL 2.5-2.5 MCG/ACT IN AERS: 2.0000 | INHALATION_SPRAY | Freq: Every day | RESPIRATORY_TRACT | 3 refills | Status: DC

## 2017-09-15 NOTE — Telephone Encounter (Signed)
Rx for Stiolto 2.5 has been sent to preferred pharmacy. Pt is aware and voiced her understanding. Nothing further is needed.

## 2017-09-20 ENCOUNTER — Encounter: Payer: Self-pay | Admitting: Emergency Medicine

## 2017-09-20 ENCOUNTER — Ambulatory Visit: Payer: PPO | Admitting: Emergency Medicine

## 2017-09-20 DIAGNOSIS — C250 Malignant neoplasm of head of pancreas: Secondary | ICD-10-CM | POA: Diagnosis not present

## 2017-09-20 DIAGNOSIS — J449 Chronic obstructive pulmonary disease, unspecified: Secondary | ICD-10-CM | POA: Diagnosis not present

## 2017-09-20 MED ORDER — TIOTROPIUM BROMIDE-OLODATEROL 2.5-2.5 MCG/ACT IN AERS: 2.0000 | INHALATION_SPRAY | Freq: Every day | RESPIRATORY_TRACT | 5 refills | Status: DC

## 2017-09-20 NOTE — Assessment & Plan Note (Signed)
She believes that she probably benefited from the [facility] although memory about this is poor.  She is willing to restart it and we will do so today.  Asked her to follow her response and we will decide based on this whether to continue.  Marland Kitchen

## 2017-09-20 NOTE — Progress Notes (Signed)
Subjective:    [name], female    DOB: October 07, 1926, 84 y.o.   MRN: [id number]  Shortness of Breath  Pertinent negatives include no ear pain, fever, headaches, leg swelling, rash, rhinorrhea, sore throat, vomiting or wheezing. Her past medical history is significant for COPD.  COPD  She complains of cough and shortness of breath. There is no wheezing. Pertinent negatives include no ear pain, fever, headaches, postnasal drip, rhinorrhea, sneezing, sore throat or trouble swallowing. Her past medical history is significant for COPD.  Cough  Associated symptoms include shortness of breath. Pertinent negatives include no ear pain, eye redness, fever, headaches, postnasal drip, rash, rhinorrhea, sore throat or wheezing. Her past medical history is significant for COPD.   84 yo former smoker (about 50-60 pk-yrs)  ROV 06/12/16 -- this is a follow-up visit for patient with a history of tobacco use, and a carcinoma the lung treated with SB RT, and COPD with mild obstruction on poor function testing. She tried to continue on Stiolto although we did discuss a possible contribution of this medication to cough. She stopped the stiolto and the cough got better. Also started her on fluticisone nasal spray to see if she would benefit, now off of it. She had a CT scan of the chest on 04/20/16 that I have personally reviewed. Some increased consolidation and scarring in the superior segment of the left lower lobe and the adjacent left upper lobe.  No new areas of metastatic disease were identified. . She has ProAir to use prn, not every day.   ROV 12/17/16 -- patient has a history of COPD, adenocarcinoma of the LLL treated with SBRT. She tells me that her breathing is OK at rest, has dyspnea with walking. She uses albuterol as needed, usually for coughing spells. She doesn't have cough frequently, non-productive. She is off flonase, she does take prilosec OTC. Minimal nasal congestion except w coughing  spells.   ROV 05/17/17 -- Patient has a history of adenocarcinoma of the left lower lobe that was treated with radiation therapy. Also COPD. She underwent a repeat CT scan of the chest on 04/20/17 that I have personally reviewed. This shows stable consolidative changes and apparent scar in the left lower lobe. I do not see any evidence for recurrence of her mass. She uses proair for coughing spells, rarely. She describes heavy breathing when she dresses and makes her bed. Denies any CP. She also has some recurrent chronic cough. She is off flonase. She refuses the flu shot.   ROV 09/20/17 --84 year old woman with a history of left lower lobe adenocarcinoma treated with radiation therapy, COPD, stable left lower lobe consolidative changes.  She also has pancreatic cancer that is been treated with stereotactic radiation. Following with Drs Benay Spice and Lisbeth Renshaw, repeat staging CT scan is planned for March. We started stiolto, she cannot recall whether it helps her, is willing to go back on it.    Review of Systems  Constitutional: Negative for fever and unexpected weight change.  HENT: Negative for congestion, dental problem, ear pain, nosebleeds, postnasal drip, rhinorrhea, sinus pressure, sneezing, sore throat and trouble swallowing.   Eyes: Negative for redness and itching.  Respiratory: Positive for cough and shortness of breath. Negative for chest tightness and wheezing.   Cardiovascular: Negative for palpitations and leg swelling.  Gastrointestinal: Negative for nausea and vomiting.  Genitourinary: Negative for dysuria.  Musculoskeletal: Negative for joint swelling.  Skin: Negative for rash.  Neurological: Negative for headaches.  Hematological:  Does not bruise/bleed easily.  Psychiatric/Behavioral: Negative for dysphoric mood. The patient is not nervous/anxious.        Objective:   Physical Exam Vitals:   09/20/17 1447 09/20/17 1449  BP:  124/70  Pulse:  74  SpO2:  92%  Weight: 157 lb  (71.2 kg)   Height: 5\' 2"  (1.575 m)    Gen: Pleasant, elderly kyphotic, in no distress,  normal affect  ENT: No lesions,  mouth clear,  oropharynx clear, no postnasal drip  Neck: No JVD, no TMG, no carotid bruits  Lungs: No use of accessory muscles, clear without rales or rhonchi  Cardiovascular: RRR, heart sounds normal, no murmur or gallops, no peripheral edema  Musculoskeletal: No deformities, no cyanosis or clubbing  Neuro: alert, non focal  Skin: Warm, no lesions or rashes  04/20/17 --  COMPARISON:  04/20/2016 chest CT.  FINDINGS: Cardiovascular: Normal heart size. Stable trace anterior pericardial effusion/thickening. Left anterior descending, left circumflex and right coronary atherosclerosis. Atherosclerotic nonaneurysmal thoracic aorta. Normal caliber pulmonary arteries.  Mediastinum/Nodes: No discrete thyroid nodules. Unremarkable esophagus. No pathologically enlarged axillary, mediastinal or gross hilar lymph nodes, noting limited sensitivity for the detection of hilar adenopathy on this noncontrast study.  Lungs/Pleura: No pneumothorax. No right pleural effusion. Trace dependent left pleural effusion, not appreciably changed. Masslike fibrosis centered in the superior segment left lower lobe measures up to 6.1 x 5.1 cm (series 3/ image 63), previously 6.2 x 5.0 cm on 04/20/2016 using similar measurement technique, not appreciably changed in size, with interval mildly increased volume loss, distortion and bronchiectasis and more sharply defined margins, compatible with continued evolution of radiation fibrosis. Moderate centrilobular and paraseptal emphysema. No acute consolidative airspace disease or new significant pulmonary nodules. Mild patchy subpleural reticulation and ground-glass attenuation throughout both lungs is not appreciably changed and is nonspecific. No frank honeycombing.  Upper abdomen: Moderate to large hiatal hernia,  stable.  Musculoskeletal: No aggressive appearing focal osseous lesions. Severe multilevel thoracic degenerative disc disease. Stable chronic severe T8 and T9 vertebral compression fractures.  IMPRESSION: 1. Continued evolution of masslike radiation fibrosis in the parahilar left mid lung. 2. No findings suspicious for local tumor recurrence or metastatic disease the chest . 3. Three-vessel coronary atherosclerosis. 4. Stable moderate to large hiatal hernia.      Assessment & Plan:  COPD (chronic obstructive pulmonary disease) ([place]) She believes that she probably benefited from the [facility] although memory about this is poor.  She is willing to restart it and we will do so today.  Asked her to follow her response and we will decide based on this whether to continue.  .  Primary cancer of head of pancreas [facility]) She is concerned about whether she is responding to radiation therapy.  She has a visit with Dr. Lisbeth Renshaw coming up soon and is excited to discuss with him, discussed possible reevaluation CT scan.  Baltazar Apo, MD, PhD 09/20/2017, 5:00 PM [facility] [phone] or if no answer [phone]

## 2017-09-20 NOTE — Assessment & Plan Note (Signed)
She is concerned about whether she is responding to radiation therapy.  She has a visit with Dr. Lisbeth Renshaw coming up soon and is excited to discuss with him, discussed possible reevaluation CT scan.

## 2017-09-20 NOTE — Patient Instructions (Signed)
We will restart Stiolto 2 puffs once a day.  If you feel that this medication is not helping you then call our office and we may decide to stop it.  If you would like for [place] to call in a 57-month supply then let us know and we will do this. Follow with Dr. Lisbeth Renshaw and get your repeat CT scan as planned. Follow with Dr Lamonte Sakai in 4 months or sooner if you have any problems.

## 2017-09-28 ENCOUNTER — Ambulatory Visit
Admission: RE | Admit: 2017-09-28 | Discharge: 2017-09-28 | Disposition: A | Discharge status: Home / Self Care | Payer: PPO | Source: Ambulatory Visit | Attending: Radiation Oncology | Admitting: Radiation Oncology

## 2017-09-28 ENCOUNTER — Encounter: Payer: Self-pay | Admitting: Radiation Oncology

## 2017-09-28 ENCOUNTER — Other Ambulatory Visit: Payer: Self-pay

## 2017-09-28 ENCOUNTER — Ambulatory Visit: Payer: Self-pay | Admitting: Radiation Oncology

## 2017-09-28 VITALS — BP 129/83 | HR 87 | Temp 97.6°F | Resp 18 | Ht 62.0 in | Wt 156.8 lb

## 2017-09-28 DIAGNOSIS — C250 Malignant neoplasm of head of pancreas: Secondary | ICD-10-CM | POA: Diagnosis not present

## 2017-09-28 DIAGNOSIS — R1031 Right lower quadrant pain: Secondary | ICD-10-CM | POA: Diagnosis not present

## 2017-09-28 DIAGNOSIS — K5900 Constipation, unspecified: Secondary | ICD-10-CM | POA: Insufficient documentation

## 2017-09-28 DIAGNOSIS — G4700 Insomnia, unspecified: Secondary | ICD-10-CM | POA: Diagnosis not present

## 2017-09-28 DIAGNOSIS — C3490 Malignant neoplasm of unspecified part of unspecified bronchus or lung: Secondary | ICD-10-CM | POA: Insufficient documentation

## 2017-09-28 DIAGNOSIS — Z923 Personal history of irradiation: Secondary | ICD-10-CM | POA: Insufficient documentation

## 2017-09-28 DIAGNOSIS — R5383 Other fatigue: Secondary | ICD-10-CM | POA: Insufficient documentation

## 2017-09-28 DIAGNOSIS — Z79899 Other long term (current) drug therapy: Secondary | ICD-10-CM | POA: Diagnosis not present

## 2017-09-28 DIAGNOSIS — C3492 Malignant neoplasm of unspecified part of left bronchus or lung: Secondary | ICD-10-CM

## 2017-09-28 DIAGNOSIS — Z08 Encounter for follow-up examination after completed treatment for malignant neoplasm: Secondary | ICD-10-CM | POA: Diagnosis not present

## 2017-09-28 DIAGNOSIS — Z79891 Long term (current) use of opiate analgesic: Secondary | ICD-10-CM | POA: Insufficient documentation

## 2017-09-28 DIAGNOSIS — R6881 Early satiety: Secondary | ICD-10-CM | POA: Diagnosis not present

## 2017-09-28 LAB — BUN & CREATININE (CHCC)
BUN: 11 mg/dL (ref 7–26)
Creatinine: 0.72 mg/dL (ref 0.60–1.10)

## 2017-09-28 NOTE — Progress Notes (Signed)
Radiation Oncology         [phone] ________________________________  Name: Celsa Nordahl MRN: [id number]  Date of Service: 09/28/2017 DOB: 1926-08-31  Post Treatment Note  CC: Jonathon Jordan, MD  Ladell Pier, MD  DiagnosisDerrek Gu, cT3N0M0adenocarcinoma of the head of the pancreas   Interval Since Last Radiation:  6 weeks   08/05/17 - 08/16/17 SBRT Treatment: Pancreas // 33 Gy in 5 fx  05/01/15-05/07/15 SBRT Treatment:  50 Gy in 5 fractions to the lung at [facility], she follows in surveillance with Dr. Lamonte Sakai.  Narrative:  The patient returns today for routine follow-up.  She tolerated radiotherapy well to the pancreas, though she had fatigue and also started her on Restoril due to insomnia.                               On review of systems, the patient states she is doing pretty well but does have some RLQ discomfort in her abdomen as well as early satiety. She reports that she's been having a hard time findings foods that taste good to her as well. She describes some bloating and constipation as well, despite taking supplements for this. She denies any nausea or emesis. No other complaints are noted.  ALLERGIES:  has No Known Allergies.  Meds: Current Outpatient Medications  Medication Sig Dispense Refill  . cholecalciferol (VITAMIN D) 1000 units tablet Take 1,000 Units by mouth daily.    . fluticasone (FLONASE) 50 MCG/ACT nasal spray Place 2 sprays into both nostrils daily. 16 g 5  . Multiple Vitamins-Minerals (OPTIVITE PO) Take 1 capsule by mouth daily.    Marland Kitchen omeprazole (PRILOSEC OTC) 20 MG tablet Take 20 mg by mouth daily.    Marland Kitchen OVER THE COUNTER MEDICATION Take 1 capsule by mouth at bedtime. Sleep aid otc    . pantoprazole (PROTONIX) 40 MG tablet Take 1 tablet (40 mg total) by mouth daily. 30 tablet 1  . prednisoLONE acetate (PRED FORTE) 1 % ophthalmic suspension INT 1 GTT IN OD D START ON mon. wed and friday  1  . temazepam (RESTORIL) 15 MG capsule Take 1  capsule (15 mg total) by mouth at bedtime as needed for sleep. 30 capsule 2  . Tiotropium Bromide-Olodaterol (STIOLTO RESPIMAT) 2.5-2.5 MCG/ACT AERS Inhale 2 puffs into the lungs daily. 1 Inhaler 5  . vitamin C (ASCORBIC ACID) 500 MG tablet Take 500 mg by mouth daily.    . vitamin E 400 UNIT capsule Take 400 Units by mouth daily.    Marland Kitchen PROAIR HFA 108 (90 Base) MCG/ACT inhaler INHALE 1 PUFF INTO LUNGS EVERY 4 HOURS AS NEEDED (Patient not taking: Reported on 09/28/2017) 8.5 g 3  . traMADol (ULTRAM) 50 MG tablet Take 1 tablet (50 mg total) by mouth every 12 (twelve) hours as needed. (Patient not taking: Reported on 09/28/2017) 40 tablet 0   No current facility-administered medications for this encounter.     Physical Findings:  height is 5\' 2"  (1.575 m) and weight is 156 lb 12.8 oz (71.1 kg). Her oral temperature is 97.6 F (36.4 C). Her blood pressure is 129/83 and her pulse is 87. Her respiration is 18 and oxygen saturation is 98%.  Pain Assessment Pain Score: 0-No pain/10 In general this is a well appearing elderly caucasian female in no acute distress. She's alert and oriented x4 and appropriate throughout the examination. Cardiopulmonary assessment is negative for acute distress and she  exhibits normal effort. She points to her RLQ as the site of her abdominal pain, without rebound or guarding noted. No focal findings are otherwise noted.   Lab Findings: Lab Results  Component Value Date   WBC 4.3 08/30/2017   HGB 11.0 (L) 08/30/2017   HCT 33.1 (L) 08/30/2017   MCV 92.4 08/30/2017   PLT 205 08/30/2017     Radiographic Findings: No results found.  Impression/Plan: 1. StageIIA, cT3N0M0adenocarcinoma of the head of the pancreas. The patient is doing ok since completing radiotherapy but has noted increased constipation and new onset RLQ pain. We discussed the role of CT Abd/Pelvis to rule out progression. She is in agreement and this will also serve as a baseline scan. I will follow up  with the results when available.  2. Stage IB, T2aN0M0 NSCLC, of the left lung. The patient will be due for surveillance scans next month. Since she will go for abdominal and pelvic CT we will order CT chest, and make sure Dr. Lamonte Sakai is aware as well. 3. Insomnia. The patient is doing well with restoril. We will follow this expectantly.  4. Desire to change PCP. The patient was re-referred to Dr. Ardeth Perfect for evaluation at her request.      Carola Rhine, [facility]

## 2017-09-29 ENCOUNTER — Telehealth: Payer: Self-pay | Admitting: *Deleted

## 2017-09-29 NOTE — Telephone Encounter (Signed)
CALLED PATIENT'S SON - ROBERT TO INFORM OF CT FOR 10-01-17 - ARRIVAL TIME - 3:15 PM @ [facility], PT. TO HAVE WATER ONLY - 4 HRS. PRIOR TO TEST, PT. TO PICK-UP CONTRAST ON 09-30-17 FOR TEST ON 10-01-17, LVM FOR A RETURN CALL

## 2017-10-01 ENCOUNTER — Ambulatory Visit (HOSPITAL_COMMUNITY)
Admission: RE | Admit: 2017-10-01 | Discharge: 2017-10-01 | Disposition: A | Discharge status: Home / Self Care | Payer: PPO | Source: Ambulatory Visit | Attending: Radiation Oncology | Admitting: Radiation Oncology

## 2017-10-01 DIAGNOSIS — C250 Malignant neoplasm of head of pancreas: Secondary | ICD-10-CM | POA: Insufficient documentation

## 2017-10-01 DIAGNOSIS — C3490 Malignant neoplasm of unspecified part of unspecified bronchus or lung: Secondary | ICD-10-CM | POA: Diagnosis not present

## 2017-10-01 DIAGNOSIS — R918 Other nonspecific abnormal finding of lung field: Secondary | ICD-10-CM | POA: Diagnosis not present

## 2017-10-01 DIAGNOSIS — Z9689 Presence of other specified functional implants: Secondary | ICD-10-CM | POA: Insufficient documentation

## 2017-10-01 DIAGNOSIS — C3492 Malignant neoplasm of unspecified part of left bronchus or lung: Secondary | ICD-10-CM

## 2017-10-01 MED ORDER — IOPAMIDOL (ISOVUE-300) INJECTION 61%
100.0000 mL | Freq: Once | INTRAVENOUS | Status: AC | PRN
  Administered 2017-10-01: 100 mL via INTRAVENOUS

## 2017-10-01 MED ORDER — IOPAMIDOL (ISOVUE-300) INJECTION 61%
INTRAVENOUS | Status: AC
  Filled 2017-10-01: qty 100

## 2017-10-01 MED ORDER — IOPAMIDOL (ISOVUE-300) INJECTION 61%
30.0000 mL | Freq: Once | INTRAVENOUS | Status: AC | PRN
  Administered 2017-10-01: 30 mL via ORAL

## 2017-10-01 MED ORDER — SODIUM CHLORIDE 0.9 % IJ SOLN
INTRAMUSCULAR | Status: AC
  Filled 2017-10-01: qty 50

## 2017-10-01 MED ORDER — IOPAMIDOL (ISOVUE-300) INJECTION 61%
INTRAVENOUS | Status: AC
  Filled 2017-10-01: qty 30

## 2017-10-06 ENCOUNTER — Telehealth: Payer: Self-pay | Admitting: Radiation Oncology

## 2017-10-06 NOTE — Telephone Encounter (Signed)
I spoke with the patient to review her films from her recent CT imaging which were reassuring. She will follow up with Dr. Lamonte Sakai and Dr. Benay Spice moving forward. She also reports anxiety and would like to speak with a counselor. I will copy this to social work.

## 2017-10-06 NOTE — Telephone Encounter (Signed)
LM for pt's son to review results and asked him to call me back .

## 2017-10-11 DIAGNOSIS — C3490 Malignant neoplasm of unspecified part of unspecified bronchus or lung: Secondary | ICD-10-CM | POA: Diagnosis not present

## 2017-10-11 DIAGNOSIS — K831 Obstruction of bile duct: Secondary | ICD-10-CM | POA: Diagnosis not present

## 2017-10-11 DIAGNOSIS — R630 Anorexia: Secondary | ICD-10-CM | POA: Diagnosis not present

## 2017-10-11 DIAGNOSIS — K219 Gastro-esophageal reflux disease without esophagitis: Secondary | ICD-10-CM | POA: Diagnosis not present

## 2017-10-11 DIAGNOSIS — C259 Malignant neoplasm of pancreas, unspecified: Secondary | ICD-10-CM | POA: Diagnosis not present

## 2017-10-11 DIAGNOSIS — Z1389 Encounter for screening for other disorder: Secondary | ICD-10-CM | POA: Diagnosis not present

## 2017-10-11 DIAGNOSIS — J449 Chronic obstructive pulmonary disease, unspecified: Secondary | ICD-10-CM | POA: Diagnosis not present

## 2017-10-11 DIAGNOSIS — F330 Major depressive disorder, recurrent, mild: Secondary | ICD-10-CM | POA: Diagnosis not present

## 2017-10-11 DIAGNOSIS — Z6828 Body mass index (BMI) 28.0-28.9, adult: Secondary | ICD-10-CM | POA: Diagnosis not present

## 2017-10-11 DIAGNOSIS — H538 Other visual disturbances: Secondary | ICD-10-CM | POA: Diagnosis not present

## 2017-10-11 DIAGNOSIS — H3530 Unspecified macular degeneration: Secondary | ICD-10-CM | POA: Diagnosis not present

## 2017-10-11 DIAGNOSIS — R5383 Other fatigue: Secondary | ICD-10-CM | POA: Diagnosis not present

## 2017-10-12 ENCOUNTER — Encounter: Payer: Self-pay | Admitting: *Deleted

## 2017-10-12 NOTE — Progress Notes (Signed)
[place] Clinical Social Work  [place] representative received referral from APP in radiation oncology for emotional support and counseling resources.  CSW contacted patient at home to offer support and assess for needs.  Patient stated she saw her PCP yesterday, and had been referred to Jeanmarie Plant, [place] with [facility]  Patient stated her appointment was tomorrow.  CSW offered additional support, and encouraged patient to call if she would like additional resources or with any needs or concerns.  Johnnye Lana, MSW, LCSW, OSW-C Clinical Social Worker [facility] [phone]

## 2017-10-13 ENCOUNTER — Telehealth: Payer: Self-pay

## 2017-10-13 NOTE — Telephone Encounter (Signed)
Spoke with pt regarding appts. Pt questioning if she needs to keep scheduled March appts "since Dr. Lisbeth Renshaw already discussed the results with me". This RN will consult Dr. Benay Spice and call will be returned to pt. Pt voiced understanding.

## 2017-10-13 NOTE — Telephone Encounter (Signed)
Ok to reschedule for approximately 3 months

## 2017-10-18 DIAGNOSIS — F330 Major depressive disorder, recurrent, mild: Secondary | ICD-10-CM | POA: Diagnosis not present

## 2017-10-20 ENCOUNTER — Telehealth: Payer: Self-pay | Admitting: Oncology

## 2017-10-20 ENCOUNTER — Telehealth: Payer: Self-pay

## 2017-10-20 NOTE — Telephone Encounter (Signed)
Spoke with pt to inform that appts are being rescheduled for April. Will receive call from schedulers with time. Voiced understanding.

## 2017-10-20 NOTE — Telephone Encounter (Signed)
Scheduled appt per 2/27 sch message - patient is aware of appt date and time.

## 2017-10-25 ENCOUNTER — Other Ambulatory Visit: Payer: Self-pay | Admitting: Nurse Practitioner

## 2017-10-25 DIAGNOSIS — C250 Malignant neoplasm of head of pancreas: Secondary | ICD-10-CM

## 2017-11-02 ENCOUNTER — Emergency Department (HOSPITAL_COMMUNITY): Payer: PPO

## 2017-11-02 ENCOUNTER — Encounter (HOSPITAL_COMMUNITY): Payer: Self-pay

## 2017-11-02 ENCOUNTER — Emergency Department (HOSPITAL_COMMUNITY)
Admission: EM | Admit: 2017-11-02 | Discharge: 2017-11-03 | Disposition: A | Discharge status: Home / Self Care | Payer: PPO | Attending: Emergency Medicine | Admitting: Emergency Medicine

## 2017-11-02 ENCOUNTER — Other Ambulatory Visit: Payer: Self-pay

## 2017-11-02 DIAGNOSIS — Z87891 Personal history of nicotine dependence: Secondary | ICD-10-CM | POA: Diagnosis not present

## 2017-11-02 DIAGNOSIS — K5641 Fecal impaction: Secondary | ICD-10-CM | POA: Insufficient documentation

## 2017-11-02 DIAGNOSIS — Z85118 Personal history of other malignant neoplasm of bronchus and lung: Secondary | ICD-10-CM | POA: Diagnosis not present

## 2017-11-02 DIAGNOSIS — Z79899 Other long term (current) drug therapy: Secondary | ICD-10-CM | POA: Insufficient documentation

## 2017-11-02 DIAGNOSIS — Z8507 Personal history of malignant neoplasm of pancreas: Secondary | ICD-10-CM | POA: Diagnosis not present

## 2017-11-02 DIAGNOSIS — J449 Chronic obstructive pulmonary disease, unspecified: Secondary | ICD-10-CM | POA: Insufficient documentation

## 2017-11-02 DIAGNOSIS — K5900 Constipation, unspecified: Secondary | ICD-10-CM | POA: Diagnosis not present

## 2017-11-02 DIAGNOSIS — R109 Unspecified abdominal pain: Secondary | ICD-10-CM | POA: Diagnosis not present

## 2017-11-02 LAB — COMPREHENSIVE METABOLIC PANEL
ALT: 52 U/L (ref 14–54)
AST: 63 U/L — AB (ref 15–41)
Albumin: 3.8 g/dL (ref 3.5–5.0)
Alkaline Phosphatase: 117 U/L (ref 38–126)
Anion gap: 9 (ref 5–15)
BUN: 6 mg/dL (ref 6–20)
CHLORIDE: 106 mmol/L (ref 101–111)
CO2: 23 mmol/L (ref 22–32)
CREATININE: 0.69 mg/dL (ref 0.44–1.00)
Calcium: 9.3 mg/dL (ref 8.9–10.3)
GFR calc non Af Amer: >60 mL/min (ref >60)
Glucose, Bld: 112 mg/dL — ABNORMAL HIGH (ref 65–99)
POTASSIUM: 3.6 mmol/L (ref 3.5–5.1)
SODIUM: 138 mmol/L (ref 135–145)
Total Bilirubin: 0.6 mg/dL (ref 0.3–1.2)
Total Protein: 8.1 g/dL (ref 6.5–8.1)

## 2017-11-02 LAB — CBC
HEMATOCRIT: 32.5 % — AB (ref 36.0–46.0)
Hemoglobin: 10.8 g/dL — ABNORMAL LOW (ref 12.0–15.0)
MCH: 30.3 pg (ref 26.0–34.0)
MCHC: 33.2 g/dL (ref 30.0–36.0)
MCV: 91 fL (ref 78.0–100.0)
PLATELETS: 224 10*3/uL (ref 150–400)
RBC: 3.57 MIL/uL — AB (ref 3.87–5.11)
RDW: 15 % (ref 11.5–15.5)
WBC: 6.1 10*3/uL (ref 4.0–10.5)

## 2017-11-02 LAB — LIPASE, BLOOD: LIPASE: 48 U/L (ref 11–51)

## 2017-11-02 MED ORDER — FLEET ENEMA 7-19 GM/118ML RE ENEM
1.0000 | ENEMA | Freq: Once | RECTAL | Status: AC
  Administered 2017-11-02: 1 via RECTAL
  Filled 2017-11-02: qty 1

## 2017-11-02 MED ORDER — ONDANSETRON 4 MG PO TBDP
4.0000 mg | ORAL_TABLET | Freq: Once | ORAL | Status: AC
  Administered 2017-11-02: 4 mg via ORAL
  Filled 2017-11-02: qty 1

## 2017-11-02 NOTE — ED Notes (Signed)
Pt states they cannot give a urine sample at this time but will notify staff when they are able to.

## 2017-11-02 NOTE — ED Provider Notes (Addendum)
[place] DEPT Provider Note   CSN: [id number] Arrival date & time: 11/02/17  1751     History   Chief Complaint Chief Complaint  Patient presents with  . Abdominal Pain  . Constipation    HPI Carol Cook is a 84 y.o. female.  HPI Patient presented to the emergency room for evaluation of abdominal cramping and constipation.  Patient feels like her symptoms started yesterday although her last normal bowel movement was on Saturday.  Patient states she had one episode of vomiting today has not had any since.  She feels like she has to have a bowel movement but is unable to do so.  She denies any trouble urinating.  She has tried multiple home remedies today including magnesium citrate without relief.  Patient feels like she has to have a bowel movement. Past Medical History:  Diagnosis Date  . COPD (chronic obstructive pulmonary disease) ([place])   . Non-small cell lung cancer (NSCLC) ([place])   . Pancreatic cancer ([place]) 07/01/2017   Adenocarcinoma  . Post-radiation pneumonitis [facility])     Patient Active Problem List   Diagnosis Date Noted  . Primary cancer of head of pancreas ([place]) 07/13/2017  . Obstructive jaundice due to malignant neoplasm ([place]) 06/25/2017  . Cough 03/26/2016  . Dyspnea 01/07/2016  . Post-radiation pneumonitis ([place]) 10/29/2015  . Non-small cell cancer of left lung ([place]) 10/23/2015  . COPD (chronic obstructive pulmonary disease) ([place]) 12/06/2014    Past Surgical History:  Procedure Laterality Date  . APPENDECTOMY    . cataracts     surgery  . CHOLECYSTECTOMY    . REPLACEMENT TOTAL KNEE Left   . TOTAL HIP ARTHROPLASTY Right     OB History    No data available       Home Medications    Prior to Admission medications   Medication Sig Start Date End Date Taking? Authorizing Provider  cholecalciferol (VITAMIN D) 1000 units tablet Take 1,000 Units by mouth daily.   Yes [provider]  escitalopram (LEXAPRO) 10  MG tablet TK 1 T PO D 10/15/17  Yes [provider]  Multiple Vitamins-Minerals (OPTIVITE PO) Take 1 capsule by mouth daily.   Yes [provider]  omeprazole (PRILOSEC OTC) 20 MG tablet Take 20 mg by mouth daily.   Yes [provider]  pantoprazole (PROTONIX) 40 MG tablet TAKE 1 TABLET(40 MG) BY MOUTH DAILY 10/25/17  Yes Owens Shark, NP  prednisoLONE acetate (PRED FORTE) 1 % ophthalmic suspension INT 1 GTT IN OD D START ON mon. wed and friday 04/14/17  Yes [provider]  PROAIR HFA 108 (90 Base) MCG/ACT inhaler INHALE 1 PUFF INTO LUNGS EVERY 4 HOURS AS NEEDED Patient taking differently: INHALE 1 PUFF INTO LUNGS EVERY 4 HOURS AS NEEDED FOR SOB AND WHEEZING 04/14/17  Yes Byrum, Rose Fillers, MD  temazepam (RESTORIL) 7.5 MG capsule TK ONE C PO PRF SLP 10/27/17  Yes [provider]  vitamin C (ASCORBIC ACID) 500 MG tablet Take 500 mg by mouth daily.   Yes [provider]  vitamin E 400 UNIT capsule Take 400 Units by mouth daily.   Yes [provider]  fluticasone (FLONASE) 50 MCG/ACT nasal spray Place 2 sprays into both nostrils daily. Patient not taking: Reported on 11/02/2017 03/26/16   Collene Gobble, MD  temazepam (RESTORIL) 15 MG capsule Take 1 capsule (15 mg total) by mouth at bedtime as needed for sleep. Patient not taking: Reported on 11/02/2017 08/16/17  Hayden Pedro, PA-C  Tiotropium Bromide-Olodaterol (STIOLTO RESPIMAT) 2.5-2.5 MCG/ACT AERS Inhale 2 puffs into the lungs daily. 09/20/17   Collene Gobble, MD  traMADol (ULTRAM) 50 MG tablet Take 1 tablet (50 mg total) by mouth every 12 (twelve) hours as needed. Patient not taking: Reported on 09/28/2017 08/31/17   Owens Shark, NP    Family History Family History  Problem Relation Age of Onset  . Dementia Mother   . Hypertension Mother   . Deep vein thrombosis Father     Social History Social History   Tobacco Use  . Smoking status: Former Smoker    Packs/day: 0.75     Years: 60.00    Pack years: 45.00    Types: Cigarettes    Last attempt to quit: 08/25/2011    Years since quitting: 6.1  . Smokeless tobacco: Never Used  Substance Use Topics  . Alcohol use: Yes    Comment: occ  . Drug use: No     Allergies   Patient has no known allergies.   Review of Systems Review of Systems  All other systems reviewed and are negative.    Physical Exam Updated Vital Signs BP (!) 153/82 (BP Location: Left Arm)   Pulse 81   Temp 97.6 F (36.4 C) (Oral)   Resp 14   Ht 1.575 m (5\' 2" )   Wt 70.3 kg (155 lb)   SpO2 98%   BMI 28.35 kg/m   Physical Exam  Constitutional: She appears well-developed and well-nourished. No distress.  HENT:  Head: Normocephalic and atraumatic.  Right Ear: External ear normal.  Left Ear: External ear normal.  Eyes: Conjunctivae are normal. Right eye exhibits no discharge. Left eye exhibits no discharge. No scleral icterus.  Neck: Neck supple. No tracheal deviation present.  Cardiovascular: Normal rate, regular rhythm and intact distal pulses.  Pulmonary/Chest: Effort normal and breath sounds normal. No stridor. No respiratory distress. She has no wheezes. She has no rales.  Abdominal: Soft. Bowel sounds are normal. She exhibits no distension and no mass. There is no tenderness. There is no rebound and no guarding.  Genitourinary: Rectal exam shows tenderness.  Genitourinary Comments: Fecal impaction,  Musculoskeletal: She exhibits no edema or tenderness.  Neurological: She is alert. She has normal strength. No cranial nerve deficit (no facial droop, extraocular movements intact, no slurred speech) or sensory deficit. She exhibits normal muscle tone. She displays no seizure activity. Coordination normal.  Skin: Skin is warm and dry. No rash noted.  Psychiatric: She has a normal mood and affect.  Nursing note and vitals reviewed.    ED Treatments / Results  Labs (all labs ordered are listed, but only abnormal results are  displayed) Labs Reviewed  COMPREHENSIVE METABOLIC PANEL - Abnormal; Notable for the following components:      Result Value   Glucose, Bld 112 (*)    AST 63 (*)    All other components within normal limits  CBC - Abnormal; Notable for the following components:   RBC 3.57 (*)    Hemoglobin 10.8 (*)    HCT 32.5 (*)    All other components within normal limits  LIPASE, BLOOD     Radiology No results found.  Procedures Fecal disimpaction Date/Time: 11/02/2017 10:43 PM Performed by: Dorie Rank, MD Authorized by: Dorie Rank, MD  Consent: Verbal consent obtained. Risks and benefits: risks, benefits and alternatives were discussed Consent given by: patient Comments: Attempted to break up the stool ball in the rectum.  Small amount of stool removed.    (including critical care time)  Medications Ordered in ED Medications  sodium phosphate (FLEET) 7-19 GM/118ML enema 1 enema (1 enema Rectal Given 11/02/17 2249)  sodium phosphate (FLEET) 7-19 GM/118ML enema 1 enema (1 enema Rectal Given 11/02/17 2321)  ondansetron (ZOFRAN-ODT) disintegrating tablet 4 mg (4 mg Oral Given 11/02/17 2321)     Initial Impression / Assessment and Plan / ED Course  I have reviewed the triage vital signs and the nursing notes.  Pertinent labs & imaging results that were available during my care of the patient were reviewed by me and considered in my medical decision making (see chart for details).  Clinical Course as of Mar 13 0001  Tue Nov 02, 2017  2244 Patient appears to have fecal impaction on rectal exam.  I was able to manually disimpact slightly.  I will order an enema to see if that gives her symptomatic relief  [JK]  2359 Performed 2nd disimpaction after the enema.  Was able to remove more stool.  Will have pt try bedside commode  [JK]    Clinical Course User Index [JK] Dorie Rank, MD   Lab tests are reassuring.  Pt has had some improvement with an enema.  Will check and xray and reassess after  she uses the bedside commode.  Final Clinical Impressions(s) / ED Diagnoses   Final diagnoses:  Fecal impaction [facility])    ED Discharge Orders    None       Dorie Rank, MD 11/03/17 Dyann Kief    Dorie Rank, MD 11/09/17 [phone]

## 2017-11-02 NOTE — ED Triage Notes (Signed)
Patient c/o abdominal cramping. Patient reports that she has had Mag Citrate, senokot, Kleritea, and Senokot. patient states no BM in  2 days.

## 2017-11-02 NOTE — ED Provider Notes (Deleted)
[place] DEPT Provider Note   CSN: [id number] Arrival date & time: 11/02/17  1751     History   Chief Complaint Chief Complaint  Patient presents with  . Abdominal Pain  . Constipation    HPI Carol Cook is a 84 y.o. female.  HPI Pt presented to the ED with abdominal cramping.  Sx started yesterday. She feels like she has to have a bowel movement.  Last BM  Was on Saturday.  She did have one episode of vomiting today.  No trouble urinating.  She has tried laxative medications without relief. Past Medical History:  Diagnosis Date  . COPD (chronic obstructive pulmonary disease) ([place])   . Non-small cell lung cancer (NSCLC) ([place])   . Pancreatic cancer ([place]) 07/01/2017   Adenocarcinoma  . Post-radiation pneumonitis [facility])     Patient Active Problem List   Diagnosis Date Noted  . Primary cancer of head of pancreas ([place]) 07/13/2017  . Obstructive jaundice due to malignant neoplasm ([place]) 06/25/2017  . Cough 03/26/2016  . Dyspnea 01/07/2016  . Post-radiation pneumonitis ([place]) 10/29/2015  . Non-small cell cancer of left lung ([place]) 10/23/2015  . COPD (chronic obstructive pulmonary disease) ([place]) 12/06/2014    Past Surgical History:  Procedure Laterality Date  . APPENDECTOMY    . cataracts     surgery  . CHOLECYSTECTOMY    . REPLACEMENT TOTAL KNEE Left   . TOTAL HIP ARTHROPLASTY Right     OB History    No data available       Home Medications    Prior to Admission medications   Medication Sig Start Date End Date Taking? Authorizing Provider  cholecalciferol (VITAMIN D) 1000 units tablet Take 1,000 Units by mouth daily.   Yes [provider]  escitalopram (LEXAPRO) 10 MG tablet TK 1 T PO D 10/15/17  Yes [provider]  Multiple Vitamins-Minerals (OPTIVITE PO) Take 1 capsule by mouth daily.   Yes [provider]  omeprazole (PRILOSEC OTC) 20 MG tablet Take 20 mg by mouth daily.   Yes [provider]  pantoprazole (PROTONIX) 40 MG tablet TAKE 1 TABLET(40 MG) BY MOUTH DAILY 10/25/17  Yes Owens Shark, NP  prednisoLONE acetate (PRED FORTE) 1 % ophthalmic suspension INT 1 GTT IN OD D START ON mon. wed and friday 04/14/17  Yes [provider]  PROAIR HFA 108 (90 Base) MCG/ACT inhaler INHALE 1 PUFF INTO LUNGS EVERY 4 HOURS AS NEEDED Patient taking differently: INHALE 1 PUFF INTO LUNGS EVERY 4 HOURS AS NEEDED FOR SOB AND WHEEZING 04/14/17  Yes Byrum, Rose Fillers, MD  temazepam (RESTORIL) 7.5 MG capsule TK ONE C PO PRF SLP 10/27/17  Yes [provider]  vitamin C (ASCORBIC ACID) 500 MG tablet Take 500 mg by mouth daily.   Yes [provider]  vitamin E 400 UNIT capsule Take 400 Units by mouth daily.   Yes [provider]  fluticasone (FLONASE) 50 MCG/ACT nasal spray Place 2 sprays into both nostrils daily. Patient not taking: Reported on 11/02/2017 03/26/16   Collene Gobble, MD  polyethylene glycol powder (MIRALAX) powder Take 17 g by mouth daily. 11/03/17   Palumbo, April, MD  temazepam (RESTORIL) 15 MG capsule Take 1 capsule (15 mg total) by mouth at bedtime as needed for sleep. Patient not taking: Reported on 11/02/2017 08/16/17   Hayden Pedro, PA-C  Tiotropium Bromide-Olodaterol (STIOLTO RESPIMAT) 2.5-2.5 MCG/ACT AERS Inhale 2 puffs into the lungs daily. 09/20/17  Collene Gobble, MD  traMADol (ULTRAM) 50 MG tablet Take 1 tablet (50 mg total) by mouth every 12 (twelve) hours as needed. Patient not taking: Reported on 09/28/2017 08/31/17   Owens Shark, NP    Family History Family History  Problem Relation Age of Onset  . Dementia Mother   . Hypertension Mother   . Deep vein thrombosis Father     Social History Social History   Tobacco Use  . Smoking status: Former Smoker    Packs/day: 0.75    Years: 60.00    Pack years: 45.00    Types: Cigarettes    Last attempt to quit: 08/25/2011    Years since quitting: 6.2  . Smokeless tobacco: Never Used    Substance Use Topics  . Alcohol use: Yes    Comment: occ  . Drug use: No     Allergies   Patient has no known allergies.   Review of Systems Review of Systems  All other systems reviewed and are negative.    Physical Exam Updated Vital Signs BP 134/76 (BP Location: Left Arm)   Pulse 71   Temp 97.6 F (36.4 C) (Oral)   Resp 14   Ht 1.575 m (5\' 2" )   Wt 70.3 kg (155 lb)   SpO2 100%   BMI 28.35 kg/m   Physical Exam   ED Treatments / Results  Labs (all labs ordered are listed, but only abnormal results are displayed) Labs Reviewed  COMPREHENSIVE METABOLIC PANEL - Abnormal; Notable for the following components:      Result Value   Glucose, Bld 112 (*)    AST 63 (*)    All other components within normal limits  CBC - Abnormal; Notable for the following components:   RBC 3.57 (*)    Hemoglobin 10.8 (*)    HCT 32.5 (*)    All other components within normal limits  LIPASE, BLOOD    EKG  EKG Interpretation None       Radiology Dg Abdomen 1 View  Result Date: 11/03/2017 CLINICAL DATA:  84 year old female with abdominal pain and constipation. EXAM: ABDOMEN - 1 VIEW COMPARISON:  CT of the abdomen and pelvis dated 10/01/2017 FINDINGS: There is no bowel dilatation or evidence of obstruction. Air is noted in the right colon. There is moderate stool in the distal colon. No free air. Multiple surgical clips in the epigastric area as well as a CBD Wall stent. Osteopenia with degenerative changes of the spine. Total right hip arthroplasty. No acute osseous pathology. IMPRESSION: No evidence of bowel obstruction or free air. Electronically Signed   By: Anner Crete M.D.   On: 11/03/2017 01:06    Procedures Procedures (including critical care time)  Medications Ordered in ED Medications  sodium phosphate (FLEET) 7-19 GM/118ML enema 1 enema (1 enema Rectal Given 11/02/17 2249)  sodium phosphate (FLEET) 7-19 GM/118ML enema 1 enema (1 enema Rectal Given 11/02/17 2321)   ondansetron (ZOFRAN-ODT) disintegrating tablet 4 mg (4 mg Oral Given 11/02/17 2321)     Initial Impression / Assessment and Plan / ED Course  I have reviewed the triage vital signs and the nursing notes.  Pertinent labs & imaging results that were available during my care of the patient were reviewed by me and considered in my medical decision making (see chart for details).  Clinical Course as of Nov 05 10  Tue Nov 02, 2017  2244 Patient appears to have fecal impaction on rectal exam.  I was able  to manually disimpact slightly.  I will order an enema to see if that gives her symptomatic relief  [JK]  2359 Performed 2nd disimpaction after the enema.  Was able to remove more stool.  Will have pt try bedside commode  [JK]    Clinical Course User Index [JK] Dorie Rank, MD   Pt presented to the ED with constipation. Labs normal.  Xrays without constipation.   Pt responded to enemas and disimpaction.    Final Clinical Impressions(s) / ED Diagnoses   Final diagnoses:  Fecal impaction ([place])  Constipation, unspecified constipation type    ED Discharge Orders        Ordered    polyethylene glycol powder (MIRALAX) powder  Daily     11/03/17 0115       Dorie Rank, MD 11/04/17 0013

## 2017-11-03 ENCOUNTER — Emergency Department (HOSPITAL_COMMUNITY): Payer: PPO

## 2017-11-03 DIAGNOSIS — R109 Unspecified abdominal pain: Secondary | ICD-10-CM | POA: Diagnosis not present

## 2017-11-03 MED ORDER — POLYETHYLENE GLYCOL 3350 17 GM/SCOOP PO POWD: 17.0000 g | Freq: Every day | ORAL | 0 refills | Status: DC

## 2017-11-05 DIAGNOSIS — R630 Anorexia: Secondary | ICD-10-CM | POA: Diagnosis not present

## 2017-11-05 DIAGNOSIS — K5909 Other constipation: Secondary | ICD-10-CM | POA: Diagnosis not present

## 2017-11-05 DIAGNOSIS — M546 Pain in thoracic spine: Secondary | ICD-10-CM | POA: Diagnosis not present

## 2017-11-05 DIAGNOSIS — C259 Malignant neoplasm of pancreas, unspecified: Secondary | ICD-10-CM | POA: Diagnosis not present

## 2017-11-08 ENCOUNTER — Telehealth: Payer: Self-pay | Admitting: *Deleted

## 2017-11-08 ENCOUNTER — Encounter: Payer: Self-pay | Admitting: *Deleted

## 2017-11-08 NOTE — Telephone Encounter (Signed)
Called pt's son with appointment options to see Dr. Benay Spice. He and patient agreed to 3/25 at 1100.

## 2017-11-08 NOTE — Progress Notes (Signed)
Carol Cook with son Carol Cook called about his mother feeling tired and weal today more than usual and she needed help getting out of bed this morning.  Normally she can use her cane or walker.  Reported she was in the ED last Tuesday with fecal impaction last Tuesday and she saw her PCP last week because of muscle spasms to her left shoulder and was prescribed codeine which is helping the pain.  She is eating and drinking with some nausea. No vomiting.  She doesn't have a fever that he can tell or any change in her other vital signs.  I was told that the PCP office staff mentioned that that they would have someone call to talk about palliative car and hospice care; I replied saying that was a conversation that Dr. Benay Spice would have with the patient and family when the time was right.  I stated I would make Shona Simpson, PA-C aware of his concerns and give him a call back. 1320 Called back and let him know to please give Dr. Benay Spice office a call and inform about his mother's situation and ask if she could be seen sooner.  1325 Call made and message left for Dr. Gearldine Shown nurse about the above situation and that the son was trying to call and to please try and call her son Carol Cook at [phone].

## 2017-11-08 NOTE — Telephone Encounter (Signed)
Message from Nani Ravens RN reporting they received a call from pt's son. She is weak and fatigued. APP in radonc recommended he call Dr. Benay Spice.

## 2017-11-12 ENCOUNTER — Other Ambulatory Visit: Payer: PPO

## 2017-11-15 ENCOUNTER — Telehealth: Payer: Self-pay

## 2017-11-15 ENCOUNTER — Inpatient Hospital Stay: Payer: PPO | Attending: Oncology | Admitting: Oncology

## 2017-11-15 VITALS — BP 157/104 | HR 71 | Resp 18 | Ht 62.0 in

## 2017-11-15 DIAGNOSIS — K5900 Constipation, unspecified: Secondary | ICD-10-CM | POA: Diagnosis not present

## 2017-11-15 DIAGNOSIS — J90 Pleural effusion, not elsewhere classified: Secondary | ICD-10-CM | POA: Insufficient documentation

## 2017-11-15 DIAGNOSIS — C250 Malignant neoplasm of head of pancreas: Secondary | ICD-10-CM | POA: Insufficient documentation

## 2017-11-15 DIAGNOSIS — D649 Anemia, unspecified: Secondary | ICD-10-CM | POA: Insufficient documentation

## 2017-11-15 DIAGNOSIS — Z79899 Other long term (current) drug therapy: Secondary | ICD-10-CM | POA: Insufficient documentation

## 2017-11-15 DIAGNOSIS — J449 Chronic obstructive pulmonary disease, unspecified: Secondary | ICD-10-CM | POA: Diagnosis not present

## 2017-11-15 DIAGNOSIS — C3412 Malignant neoplasm of upper lobe, left bronchus or lung: Secondary | ICD-10-CM

## 2017-11-15 DIAGNOSIS — K831 Obstruction of bile duct: Secondary | ICD-10-CM | POA: Diagnosis not present

## 2017-11-15 MED ORDER — OXYCODONE-ACETAMINOPHEN 5-325 MG PO TABS
1.0000 | ORAL_TABLET | Freq: Once | ORAL | Status: AC
Start: 2017-11-15 — End: 2017-11-15
  Administered 2017-11-15: 1 via ORAL

## 2017-11-15 MED ORDER — OXYCODONE-ACETAMINOPHEN 5-325 MG PO TABS
ORAL_TABLET | ORAL | Status: AC
  Filled 2017-11-15: qty 1

## 2017-11-15 MED ORDER — OXYCODONE-ACETAMINOPHEN 5-325 MG PO TABS: ORAL_TABLET | ORAL | 0 refills | Status: DC

## 2017-11-15 NOTE — Progress Notes (Signed)
[place] OFFICE PROGRESS NOTE   Diagnosis: Pancreas cancer  INTERVAL HISTORY:   Carol Cook returns as scheduled.  She was seen in the emergency room 11/02/2017 with a fecal impaction.  Her bowels are now moving with Senokot-S and MiraLAX. She reports the acute onset of pain in the mid/upper back 11/11/2017.  The pain is chiefly right-sided and is worse with movement of the right arm and weightbearing on the right arm.  She was prescribed Vicodin by her primary physician.  She does not get relief of pain with the Vicodin.  She has less pain when she is still.  No abdominal pain.  She has chronic anorexia.  No known trauma.  Objective:  Vital signs in last 24 hours:  Blood pressure (!) 157/104, pulse 71, resp. rate 18, height _0  (1.575 m).    Resp: Lungs clear bilaterally, no respiratory distress Cardio: Regular rate and rhythm GI: The abdomen is nontender Vascular: No leg edema Musculoskeletal: Mild tenderness at the mid thoracic spine.  There is tenderness over the right posterior chest wall a few fingers inferior to the right scapula.  No rash or mass.  Pain with abduction at the right shoulder.  No pain with internal/external rotation at the right shoulder.  No pain with motion at the left shoulder.    Lab Results:  Lab Results  Component Value Date   WBC 6.1 11/02/2017   HGB 10.8 (L) 11/02/2017   HCT 32.5 (L) 11/02/2017   MCV 91.0 11/02/2017   PLT 224 11/02/2017   NEUTROABS 3.0 08/30/2017    CMP     Component Value Date/Time   NA 138 11/02/2017 1954   NA 134 (L) 04/20/2016 1116   K 3.6 11/02/2017 1954   K 4.0 04/20/2016 1116   CL 106 11/02/2017 1954   CO2 23 11/02/2017 1954   CO2 27 04/20/2016 1116   GLUCOSE 112 (H) 11/02/2017 1954   GLUCOSE 105 04/20/2016 1116   BUN 6 11/02/2017 1954   BUN 10.5 04/20/2016 1116   CREATININE 0.69 11/02/2017 1954   CREATININE 0.72 09/28/2017 1433   CREATININE 0.8 04/20/2016 1116   CALCIUM 9.3 11/02/2017 1954     CALCIUM 10.6 (H) 04/20/2016 1116   PROT 8.1 11/02/2017 1954   PROT 7.2 04/20/2016 1116   ALBUMIN 3.8 11/02/2017 1954   ALBUMIN 3.7 04/20/2016 1116   AST 63 (H) 11/02/2017 1954   AST 26 04/20/2016 1116   ALT 52 11/02/2017 1954   ALT 13 04/20/2016 1116   ALKPHOS 117 11/02/2017 1954   ALKPHOS 72 04/20/2016 1116   BILITOT 0.6 11/02/2017 1954   BILITOT 0.76 04/20/2016 1116   GFRNONAA >60 11/02/2017 1954   GFRNONAA >60 09/28/2017 1433   GFRAA >60 11/02/2017 1954   GFRAA >60 09/28/2017 1433    No results found for: CEA1  Lab Results  Component Value Date   INR 0.96 06/27/2017    Imaging:  No results found.  Medications: I have reviewed the patient's current medications.   Assessment/Plan: 1. Adenocarcinoma of the pancreas head/uncinate, status post an EUS biopsy at [facility] 02/28/2017 ? CT abdomen/pelvis 06/25/2018- uncinate mass, abutment of the superior mesenteric artery, biliary obstruction ? MRCP 06/26/2017- pancreas head/uncinate mass with abutment of the superior mesenteric artery and abdominal aorta, compression of the splenic vein and SMV, biliary dilatation, small left pleural effusion ? SBRT 08/05/2017-08/16/2017 ? CT abdomen/pelvis 10/01/2017-stable pancreas head/uncinate mass, stable peripancreatic lymph nodes, no evidence of progressive metastatic disease   2.  Obstructive jaundice secondary to #1, status post placement of a biliary stent at [facility] 07/01/2017  3.COPD  4.Non-small cell lung cancer, hypermetabolic left upper lobe mass, favor adenocarcinoma, EGFR,ALK ,and ROS-1negative  SBRT at [facility], 50 Gy in 5 fractions 05/01/2015-05/07/2015  CT chest 10/01/2016-no evidence of recurrent lung cancer  5.Anemia-improved 08/30/2017  6.  Postprandial abdominal discomfort.  Trial of Protonix initiated 08/30/2017.   Disposition: [name] has a history of locally advanced pancreas cancer.  She was treated with stereotactic  radiation in December 2019.  There is no clinical or radiologic evidence of disease progression.  She presents today with severe pain at the right mid back.  The pain is most likely related to a benign musculoskeletal condition.  I doubt the pain is related to pancreas or lung cancer.  The the pain was of acute onset and is positional.  I suspect she has a complete compression fracture or rib fracture.  She declined an x-ray evaluation.  I gave her a prescription for Percocet to use as needed for pain.  She and her son will contact us if the pain is not improved over the next week.  She will return for an office visit as scheduled on 11/30/2017.  Carol Coder, MD  11/15/2017  11:34 AM

## 2017-11-15 NOTE — Telephone Encounter (Signed)
Returned call to pt. Pt "in a lot of pain, but Between my son and grandson I'm trying to make it in". Made MD aware. Pt knows to call back with any further concerns.

## 2017-11-16 ENCOUNTER — Ambulatory Visit: Payer: PPO | Admitting: Oncology

## 2017-11-29 ENCOUNTER — Telehealth: Payer: Self-pay

## 2017-11-29 NOTE — Telephone Encounter (Signed)
Spoke with pt daughter regarding appt tomorrow. Daughter stated that pt "won't be able to physically make tomorrow's appt". Reports that pt is "feeling bad, weak, has diarrhea and nauseous'. Reported that pt has had "1-2 episodes daily for the past 4-5 Daughter denies that pt has taken anything for nausea or diarrhea. Instructed to take nausea pill and imodium for active diarrhea. Per Dr. Benay Spice, if pt unable to be transported by family to appt she should go to the nearest ED. Daughter voiced understanding. Received return call from daughter stating that pt desires to keep existing appt. Made MD aware.

## 2017-11-30 ENCOUNTER — Telehealth: Payer: Self-pay | Admitting: Nurse Practitioner

## 2017-11-30 ENCOUNTER — Other Ambulatory Visit: Payer: Self-pay | Admitting: Nurse Practitioner

## 2017-11-30 ENCOUNTER — Encounter: Payer: Self-pay | Admitting: Nurse Practitioner

## 2017-11-30 ENCOUNTER — Inpatient Hospital Stay: Payer: PPO | Attending: Oncology | Admitting: Nurse Practitioner

## 2017-11-30 VITALS — BP 128/80 | HR 83 | Temp 98.3°F | Resp 18 | Ht 62.0 in | Wt 151.0 lb

## 2017-11-30 DIAGNOSIS — D649 Anemia, unspecified: Secondary | ICD-10-CM | POA: Diagnosis not present

## 2017-11-30 DIAGNOSIS — R918 Other nonspecific abnormal finding of lung field: Secondary | ICD-10-CM | POA: Diagnosis not present

## 2017-11-30 DIAGNOSIS — Z79899 Other long term (current) drug therapy: Secondary | ICD-10-CM | POA: Insufficient documentation

## 2017-11-30 DIAGNOSIS — C250 Malignant neoplasm of head of pancreas: Secondary | ICD-10-CM | POA: Diagnosis not present

## 2017-11-30 DIAGNOSIS — Z85118 Personal history of other malignant neoplasm of bronchus and lung: Secondary | ICD-10-CM | POA: Insufficient documentation

## 2017-11-30 DIAGNOSIS — K831 Obstruction of bile duct: Secondary | ICD-10-CM | POA: Insufficient documentation

## 2017-11-30 DIAGNOSIS — J449 Chronic obstructive pulmonary disease, unspecified: Secondary | ICD-10-CM | POA: Diagnosis not present

## 2017-11-30 DIAGNOSIS — M4854XA Collapsed vertebra, not elsewhere classified, thoracic region, initial encounter for fracture: Secondary | ICD-10-CM | POA: Diagnosis not present

## 2017-11-30 MED ORDER — OXYCODONE-ACETAMINOPHEN 5-325 MG PO TABS
2.0000 | ORAL_TABLET | Freq: Once | ORAL | Status: AC
  Administered 2017-11-30: 2 via ORAL

## 2017-11-30 MED ORDER — HYDROMORPHONE HCL 2 MG PO TABS: 2.0000 mg | ORAL_TABLET | Freq: Four times a day (QID) | ORAL | 0 refills | Status: DC | PRN

## 2017-11-30 MED ORDER — OXYCODONE-ACETAMINOPHEN 5-325 MG PO TABS
ORAL_TABLET | ORAL | Status: AC
  Filled 2017-11-30: qty 2

## 2017-11-30 MED ORDER — ONDANSETRON HCL 8 MG PO TABS: 8.0000 mg | ORAL_TABLET | Freq: Three times a day (TID) | ORAL | 0 refills | Status: DC | PRN

## 2017-11-30 NOTE — Progress Notes (Addendum)
[place] OFFICE PROGRESS NOTE   Diagnosis: Pancreas cancer  INTERVAL HISTORY:   Carol Cook returns as scheduled.  She reports a rib pain she was experiencing at the time of her last visit is better.  She now has "pressure" in the mid back region at the "bra line".  She takes one oxycodone tablet a day with minimal relief.  She is also having nausea and intermittent loose stools.  Standing exacerbates the pain and nausea.  She feels better when she is reclining or laying down.  Appetite is poor.  Objective:  Vital signs in last 24 hours:  Blood pressure 128/80, pulse 83, temperature 98.3 F (36.8 C), temperature source Oral, resp. rate 18, height 5' 2"  (1.575 m), weight 151 lb (68.5 kg), SpO2 99 %.    HEENT: No thrush or ulcers. Resp: Lungs clear bilaterally. Cardio: Regular rate and rhythm. GI: Abdomen soft and nontender.  No hepatomegaly. Vascular: No leg edema. Neuro: Alert and oriented. Skin: No rash. Musculoskeletal: Marked kyphosis.  Mild tenderness mid back right greater than left.   Lab Results:  Lab Results  Component Value Date   WBC 6.1 11/02/2017   HGB 10.8 (L) 11/02/2017   HCT 32.5 (L) 11/02/2017   MCV 91.0 11/02/2017   PLT 224 11/02/2017   NEUTROABS 3.0 08/30/2017    Imaging:  No results found.  Medications: I have reviewed the patient's current medications.  Assessment/Plan: 1. Adenocarcinoma of the pancreas head/uncinate, status post an EUS biopsy at [facility] 02/28/2017 ? CT abdomen/pelvis 06/25/2018-uncinate mass, abutment of the superior mesenteric artery, biliary obstruction ? MRCP 06/26/2017-pancreas head/uncinate mass with abutment of the superior mesenteric artery and abdominal aorta, compression of the splenic vein and SMV, biliary dilatation, small left pleural effusion ? SBRT12/13/2018-08/16/2017 ? CT abdomen/pelvis 10/01/2017-stable pancreas head/uncinate mass, stable peripancreatic lymph nodes, no evidence of progressive  metastatic disease   2. Obstructive jaundice secondary to #1, status post placement of a biliary stent at [facility] 07/01/2017  3.COPD  4.Non-small cell lung cancer, hypermetabolic left upper lobe mass, favor adenocarcinoma, EGFR,ALK ,and ROS-1negative  SBRT at [facility], 50 Gy in 5 fractions 05/01/2015-05/07/2015  CT chest 10/01/2016-no evidence of recurrent lung cancer  5.Anemia-improved 08/30/2017  6.Postprandial abdominal discomfort. Trial of Protonix initiated 08/30/2017.   Disposition: Carol Cook presents with worsening back pain over the past few weeks.  She is also having nausea and intermittent loose stools.  She and her daughter understand symptoms may be related to progression of the pancreas cancer.  Dr. Benay Spice recommends proceeding with restaging CT scans.  She will return for lab and CT scans 12/02/2017.  For the pain she will discontinue Percocet and begin hydromorphone 2 mg 1-2 tablets every 6 hours as needed.  For nausea a prescription was sent to her pharmacy for Zofran 8 mg every 8 hours as needed.  She will return for a follow-up visit on 12/03/2017.  She will contact the office in the interim with any problems.  Patient seen with Dr. Benay Spice.  25 minutes were spent face-to-face at today's visit with the majority of that time involved in counseling/coordination of care.  Ned Card ANP/GNP-BC   11/30/2017  3:35 PM  This was a shared visit with Ned Card.  Carol Cook  was interviewed and examined.  The back pain has progressed compared to when I saw her 2 weeks ago.  We discussed the differential diagnosis with Ms. Chaloux.  She agrees to a diagnostic CT scan.  Her overall performance  status appears to be declining.  We are concerned this is secondary to disease progression.  She is a candidate for Hospice care.  She agrees to a [facility] referral.  We adjusted the narcotic regimen today.  She will return for an office visit  12/03/2017.    Julieanne Manson, MD

## 2017-11-30 NOTE — Telephone Encounter (Signed)
Scheduled appt per 4/9 los - left message for patient with appts date and time.

## 2017-12-02 ENCOUNTER — Ambulatory Visit (HOSPITAL_COMMUNITY)
Admission: RE | Admit: 2017-12-02 | Discharge: 2017-12-02 | Disposition: A | Discharge status: Home / Self Care | Payer: PPO | Source: Ambulatory Visit | Attending: Nurse Practitioner | Admitting: Nurse Practitioner

## 2017-12-02 ENCOUNTER — Inpatient Hospital Stay: Payer: PPO

## 2017-12-02 DIAGNOSIS — J439 Emphysema, unspecified: Secondary | ICD-10-CM | POA: Insufficient documentation

## 2017-12-02 DIAGNOSIS — R918 Other nonspecific abnormal finding of lung field: Secondary | ICD-10-CM | POA: Insufficient documentation

## 2017-12-02 DIAGNOSIS — C250 Malignant neoplasm of head of pancreas: Secondary | ICD-10-CM | POA: Diagnosis not present

## 2017-12-02 DIAGNOSIS — K449 Diaphragmatic hernia without obstruction or gangrene: Secondary | ICD-10-CM | POA: Insufficient documentation

## 2017-12-02 DIAGNOSIS — M4854XA Collapsed vertebra, not elsewhere classified, thoracic region, initial encounter for fracture: Secondary | ICD-10-CM | POA: Insufficient documentation

## 2017-12-02 DIAGNOSIS — S22050A Wedge compression fracture of T5-T6 vertebra, initial encounter for closed fracture: Secondary | ICD-10-CM | POA: Diagnosis not present

## 2017-12-02 DIAGNOSIS — I2510 Atherosclerotic heart disease of native coronary artery without angina pectoris: Secondary | ICD-10-CM | POA: Diagnosis not present

## 2017-12-02 DIAGNOSIS — I700 Atherosclerosis of aorta: Secondary | ICD-10-CM | POA: Insufficient documentation

## 2017-12-02 DIAGNOSIS — K8689 Other specified diseases of pancreas: Secondary | ICD-10-CM | POA: Diagnosis not present

## 2017-12-02 LAB — CMP (CANCER CENTER ONLY)
ALT: 13 U/L (ref 0–55)
AST: 25 U/L (ref 5–34)
Albumin: 3.2 g/dL — ABNORMAL LOW (ref 3.5–5.0)
Alkaline Phosphatase: 301 U/L — ABNORMAL HIGH (ref 40–150)
Anion gap: 8 (ref 3–11)
BUN: 8 mg/dL (ref 7–26)
CHLORIDE: 101 mmol/L (ref 98–109)
CO2: 27 mmol/L (ref 22–29)
Calcium: 10.6 mg/dL — ABNORMAL HIGH (ref 8.4–10.4)
Creatinine: 0.78 mg/dL (ref 0.60–1.10)
Glucose, Bld: 90 mg/dL (ref 70–140)
POTASSIUM: 4.4 mmol/L (ref 3.5–5.1)
SODIUM: 136 mmol/L (ref 136–145)
Total Bilirubin: 0.5 mg/dL (ref 0.2–1.2)
Total Protein: 6.7 g/dL (ref 6.4–8.3)

## 2017-12-02 LAB — CBC WITH DIFFERENTIAL (CANCER CENTER ONLY)
Basophils Absolute: 0.1 10*3/uL (ref 0.0–0.1)
Basophils Relative: 1 %
EOS ABS: 0.1 10*3/uL (ref 0.0–0.5)
EOS PCT: 2 %
HCT: 35.6 % (ref 34.8–46.6)
Hemoglobin: 11.6 g/dL (ref 11.6–15.9)
LYMPHS ABS: 0.7 10*3/uL — AB (ref 0.9–3.3)
LYMPHS PCT: 14 %
MCH: 30 pg (ref 25.1–34.0)
MCHC: 32.6 g/dL (ref 31.5–36.0)
MCV: 92 fL (ref 79.5–101.0)
MONO ABS: 0.5 10*3/uL (ref 0.1–0.9)
Monocytes Relative: 9 %
Neutro Abs: 3.8 10*3/uL (ref 1.5–6.5)
Neutrophils Relative %: 74 %
PLATELETS: 245 10*3/uL (ref 145–400)
RBC: 3.87 MIL/uL (ref 3.70–5.45)
RDW: 15.2 % — AB (ref 11.2–14.5)
WBC Count: 5.2 10*3/uL (ref 3.9–10.3)

## 2017-12-02 MED ORDER — IOPAMIDOL (ISOVUE-300) INJECTION 61%
30.0000 mL | Freq: Once | INTRAVENOUS | Status: AC | PRN
  Administered 2017-12-02: 30 mL via ORAL

## 2017-12-02 MED ORDER — IOHEXOL 300 MG/ML  SOLN
100.0000 mL | Freq: Once | INTRAMUSCULAR | Status: AC | PRN
  Administered 2017-12-02: 100 mL via INTRAVENOUS

## 2017-12-02 MED ORDER — IOPAMIDOL (ISOVUE-300) INJECTION 61%
INTRAVENOUS | Status: AC
  Filled 2017-12-02: qty 30

## 2017-12-03 ENCOUNTER — Ambulatory Visit: Payer: PPO | Admitting: Oncology

## 2017-12-03 ENCOUNTER — Telehealth: Payer: Self-pay | Admitting: Oncology

## 2017-12-03 ENCOUNTER — Telehealth: Payer: Self-pay

## 2017-12-03 LAB — CANCER ANTIGEN 19-9: CAN 19-9: 955 U/mL — AB (ref 0–35)

## 2017-12-03 NOTE — Telephone Encounter (Signed)
Scheduled appt per 4/12 sch message - pt is aware of appt date and time.

## 2017-12-03 NOTE — Telephone Encounter (Signed)
Called to see if pt and daughter aware of appt. Pt daughter states "we were not aware of appt and I don't think we can make it in today". Pt also wants to know results of CT scan. This RN voiced understanding. Will consult MD to see when to reschedule and about CT results.

## 2017-12-06 ENCOUNTER — Telehealth: Payer: Self-pay

## 2017-12-06 NOTE — Telephone Encounter (Signed)
Spoke with pt daughter to inform that hopsice referral was made. Daughter voiced understanding and states hospice is due to come tonight for visit. Daughter also stated that pt wants to speak directly to Dr. Benay Spice about CT results. MD made aware. Daughter states "this is a heads up, but my mom is in denial. She wants further treatment and possibly a second opinion, but you didn't hear this from me because she can get angry at times". This RN voiced understanding. Will relay info to MD.

## 2017-12-15 ENCOUNTER — Inpatient Hospital Stay (HOSPITAL_BASED_OUTPATIENT_CLINIC_OR_DEPARTMENT_OTHER): Payer: PPO | Admitting: Oncology

## 2017-12-15 ENCOUNTER — Telehealth: Payer: Self-pay

## 2017-12-15 DIAGNOSIS — K831 Obstruction of bile duct: Secondary | ICD-10-CM

## 2017-12-15 DIAGNOSIS — Z85118 Personal history of other malignant neoplasm of bronchus and lung: Secondary | ICD-10-CM

## 2017-12-15 DIAGNOSIS — M4854XA Collapsed vertebra, not elsewhere classified, thoracic region, initial encounter for fracture: Secondary | ICD-10-CM

## 2017-12-15 DIAGNOSIS — C250 Malignant neoplasm of head of pancreas: Secondary | ICD-10-CM

## 2017-12-15 DIAGNOSIS — D649 Anemia, unspecified: Secondary | ICD-10-CM | POA: Diagnosis not present

## 2017-12-15 DIAGNOSIS — R918 Other nonspecific abnormal finding of lung field: Secondary | ICD-10-CM

## 2017-12-15 DIAGNOSIS — Z79899 Other long term (current) drug therapy: Secondary | ICD-10-CM | POA: Diagnosis not present

## 2017-12-15 DIAGNOSIS — J449 Chronic obstructive pulmonary disease, unspecified: Secondary | ICD-10-CM

## 2017-12-15 MED ORDER — HYDROMORPHONE HCL 2 MG PO TABS: 2.0000 mg | ORAL_TABLET | Freq: Four times a day (QID) | ORAL | 0 refills | Status: DC | PRN

## 2017-12-15 NOTE — Progress Notes (Signed)
[place] OFFICE PROGRESS NOTE   Diagnosis: Pancreas cancer  INTERVAL HISTORY:   Ms. Halberstadt returns as scheduled.  A restaging CT evaluation 12/02/2017 revealed innumerable pulmonary nodules, a new T6 compression fracture, and an increase in the size of the pancreas mass. She reports improvement in back pain.  She has discomfort in the left flank region when she lies down at night.  This is relieved with hydromorphone. Her activity level remains poor.  She has a poor appetite.  Ms. Dungee is here today with her son and daughter.  Objective:  Vital signs in last 24 hours:  Blood pressure 123/83, pulse 69, temperature 98.4 F (36.9 C), temperature source Oral, resp. rate 18, height 5' 2"  (1.575 m), weight 151 lb 8 oz (68.7 kg), SpO2 95 %.    Resp: Lungs clear bilaterally, decreased breath sounds at the left lower chest Cardio: Regular rate and rhythm GI: No hepatomegaly, no mass, nontender Vascular: No leg edema Musculoskeletal: No tenderness or mass at the left flank   Lab Results:  Lab Results  Component Value Date   WBC 5.2 12/02/2017   HGB 11.6 12/02/2017   HCT 35.6 12/02/2017   MCV 92.0 12/02/2017   PLT 245 12/02/2017   NEUTROABS 3.8 12/02/2017    CMP     Component Value Date/Time   NA 136 12/02/2017 1040   NA 134 (L) 04/20/2016 1116   K 4.4 12/02/2017 1040   K 4.0 04/20/2016 1116   CL 101 12/02/2017 1040   CO2 27 12/02/2017 1040   CO2 27 04/20/2016 1116   GLUCOSE 90 12/02/2017 1040   GLUCOSE 105 04/20/2016 1116   BUN 8 12/02/2017 1040   BUN 10.5 04/20/2016 1116   CREATININE 0.78 12/02/2017 1040   CREATININE 0.8 04/20/2016 1116   CALCIUM 10.6 (H) 12/02/2017 1040   CALCIUM 10.6 (H) 04/20/2016 1116   PROT 6.7 12/02/2017 1040   PROT 7.2 04/20/2016 1116   ALBUMIN 3.2 (L) 12/02/2017 1040   ALBUMIN 3.7 04/20/2016 1116   AST 25 12/02/2017 1040   AST 26 04/20/2016 1116   ALT 13 12/02/2017 1040   ALT 13 04/20/2016 1116   ALKPHOS 301 (H)  12/02/2017 1040   ALKPHOS 72 04/20/2016 1116   BILITOT 0.5 12/02/2017 1040   BILITOT 0.76 04/20/2016 1116   GFRNONAA >60 12/02/2017 1040   GFRAA >60 12/02/2017 1040    Medications: I have reviewed the patient's current medications.   Assessment/Plan: 1. Adenocarcinoma of the pancreas head/uncinate, status post an EUS biopsy at [facility] 02/28/2017 ? CT abdomen/pelvis 06/25/2018-uncinate mass, abutment of the superior mesenteric artery, biliary obstruction ? MRCP 06/26/2017-pancreas head/uncinate mass with abutment of the superior mesenteric artery and abdominal aorta, compression of the splenic vein and SMV, biliary dilatation, small left pleural effusion ? SBRT12/13/2018-08/16/2017 ? CT abdomen/pelvis 10/01/2017-stable pancreas head/uncinate mass, stable peripancreatic lymph nodes, no evidence of progressive metastatic disease ? CTs 12/02/2017- innumerable bilateral pulmonary nodules, increased size of the pancreas head/uncinate mass, new T6 compression fracture   2. Obstructive jaundice secondary to #1, status post placement of a biliary stent at [facility] 07/01/2017  3.COPD  4.Non-small cell lung cancer, hypermetabolic left upper lobe mass, favor adenocarcinoma, EGFR,ALK ,and ROS-1negative  SBRT at [facility], 50 Gy in 5 fractions 05/01/2015-05/07/2015  CT chest 10/01/2016-no evidence of recurrent lung cancer  5.Anemia-improved 08/30/2017  6.Postprandial abdominal discomfort. Trial of Protonix initiated 08/30/2017.   Disposition: Ms. Moudy has pancreas cancer.  The CT 12/02/2017 reveals evidence of disease progression  with an enlarging pancreas mass and new lung metastases.  The back pain she has experienced over the past month may be related to a benign osteoporotic fracture.  This pain has improved.  The current pain at the left flank may be related to a benign muscular skeletal condition or local progression of the pancreas tumor.  I discussed the  current situation, prognosis, and treatment options at length with Ms. Thaxton her family.  She understands no therapy will be curative.  We discussed comfort/supportive care versus a trial of systemic chemotherapy.  She would like to consider chemotherapy.  We discussed single agent gemcitabine and gemcitabine/Abraxane.  We reviewed the potential toxicities associated with these regimens.  She would like to attend a chemotherapy teaching class later this week and then decide on whether to try chemotherapy.  She understands the small chance of a measurable clinical response with chemotherapy.  The goal of chemotherapy would be to improve her pain, appetite, and potentially extend life.  She will contact us after the chemotherapy teaching class.  She will be scheduled for an office visit in 1 month.  We will see her sooner if she decides to proceed with chemotherapy.  25 minutes were spent with the patient today.  The majority of the time was used for counseling and coordination of care.  Betsy Coder, MD  12/15/2017  11:28 AM

## 2017-12-15 NOTE — Telephone Encounter (Signed)
Printed avs and calender of upcoming appointment. Per 4/24 los

## 2017-12-17 ENCOUNTER — Inpatient Hospital Stay: Payer: PPO

## 2017-12-21 ENCOUNTER — Telehealth: Payer: Self-pay | Admitting: *Deleted

## 2017-12-21 NOTE — Telephone Encounter (Signed)
Message from pt's daughter reporting she has decided to take chemotherapy. Message to MD.

## 2017-12-21 NOTE — Telephone Encounter (Signed)
Spoke with daughter, she reports pt is ready to begin treatment with single agent gemcitabine. She asked for late morning early afternoon appointments. "She doesn't do well in the mornings." Message to schedulers.

## 2017-12-22 ENCOUNTER — Telehealth: Payer: Self-pay | Admitting: Oncology

## 2017-12-22 NOTE — Telephone Encounter (Signed)
Scheduled appt per 4/30 sch message - patients daughter is aware of apt date and time.

## 2017-12-23 ENCOUNTER — Other Ambulatory Visit: Payer: Self-pay | Admitting: Oncology

## 2017-12-23 ENCOUNTER — Other Ambulatory Visit: Payer: Self-pay | Admitting: Nurse Practitioner

## 2017-12-23 ENCOUNTER — Telehealth: Payer: Self-pay | Admitting: *Deleted

## 2017-12-23 DIAGNOSIS — Z515 Encounter for palliative care: Secondary | ICD-10-CM | POA: Insufficient documentation

## 2017-12-23 DIAGNOSIS — C250 Malignant neoplasm of head of pancreas: Secondary | ICD-10-CM

## 2017-12-23 DIAGNOSIS — Z7189 Other specified counseling: Secondary | ICD-10-CM

## 2017-12-23 NOTE — Progress Notes (Signed)
START OFF PATHWAY REGIMEN - Pancreatic   OFF00015:Gemcitabine 1,000 mg/m2 Days 1, 8, 15 q28 Days:   A cycle is every 28 days (3 weeks on and 1 week off):     Gemcitabine   **Always confirm dose/schedule in your pharmacy ordering system**    Patient Characteristics: Adenocarcinoma, Metastatic Disease, First Line, PS = 0, 1 Histology: Adenocarcinoma Current evidence of distant metastases<= Yes AJCC T Category: Staged < 8th Ed. AJCC N Category: Staged < 8th Ed. AJCC M Category: Staged < 8th Ed. AJCC 8 Stage Grouping: Staged < 8th Ed. Line of Therapy: First Line  Intent of Therapy: Non-Curative / Palliative Intent, Discussed with Patient

## 2017-12-23 NOTE — Telephone Encounter (Signed)
Spoke with pt's daughter, informed her pt will have the first treatment peripherally next week. Will plan for port with future treatments if she has any difficulty with first infusion. Daughter voiced understanding.

## 2017-12-25 ENCOUNTER — Other Ambulatory Visit: Payer: Self-pay | Admitting: Oncology

## 2017-12-27 ENCOUNTER — Telehealth: Payer: Self-pay

## 2017-12-27 NOTE — Telephone Encounter (Signed)
Returned call to pt daughter. Daughter had questions regarding chemo authorization for her treatment this week. Spoke with chemo prior authorization specialist to verify. Chemo is authorized, informed pt daughter. Pt daughter voiced understanding.

## 2017-12-28 ENCOUNTER — Inpatient Hospital Stay: Payer: PPO

## 2017-12-28 ENCOUNTER — Telehealth: Payer: Self-pay | Admitting: Nurse Practitioner

## 2017-12-28 ENCOUNTER — Inpatient Hospital Stay (HOSPITAL_BASED_OUTPATIENT_CLINIC_OR_DEPARTMENT_OTHER): Payer: PPO | Admitting: Nurse Practitioner

## 2017-12-28 ENCOUNTER — Inpatient Hospital Stay: Payer: PPO | Attending: Nurse Practitioner

## 2017-12-28 ENCOUNTER — Encounter: Payer: Self-pay | Admitting: Nurse Practitioner

## 2017-12-28 VITALS — BP 113/77 | HR 89 | Temp 98.0°F | Resp 18 | Ht 62.0 in | Wt 148.2 lb

## 2017-12-28 DIAGNOSIS — J449 Chronic obstructive pulmonary disease, unspecified: Secondary | ICD-10-CM | POA: Diagnosis not present

## 2017-12-28 DIAGNOSIS — C250 Malignant neoplasm of head of pancreas: Secondary | ICD-10-CM

## 2017-12-28 DIAGNOSIS — Z85118 Personal history of other malignant neoplasm of bronchus and lung: Secondary | ICD-10-CM | POA: Diagnosis not present

## 2017-12-28 DIAGNOSIS — Z5111 Encounter for antineoplastic chemotherapy: Secondary | ICD-10-CM | POA: Diagnosis not present

## 2017-12-28 DIAGNOSIS — K831 Obstruction of bile duct: Secondary | ICD-10-CM | POA: Insufficient documentation

## 2017-12-28 DIAGNOSIS — R531 Weakness: Secondary | ICD-10-CM | POA: Insufficient documentation

## 2017-12-28 LAB — CBC WITH DIFFERENTIAL (CANCER CENTER ONLY)
BASOS ABS: 0 10*3/uL (ref 0.0–0.1)
Basophils Relative: 1 %
Eosinophils Absolute: 0.1 10*3/uL (ref 0.0–0.5)
Eosinophils Relative: 1 %
HEMATOCRIT: 32.3 % — AB (ref 34.8–46.6)
Hemoglobin: 10.7 g/dL — ABNORMAL LOW (ref 11.6–15.9)
LYMPHS ABS: 0.5 10*3/uL — AB (ref 0.9–3.3)
LYMPHS PCT: 7 %
MCH: 29.9 pg (ref 25.1–34.0)
MCHC: 33.1 g/dL (ref 31.5–36.0)
MCV: 90.2 fL (ref 79.5–101.0)
MONO ABS: 0.4 10*3/uL (ref 0.1–0.9)
Monocytes Relative: 7 %
NEUTROS ABS: 5.2 10*3/uL (ref 1.5–6.5)
Neutrophils Relative %: 84 %
Platelet Count: 226 10*3/uL (ref 145–400)
RBC: 3.58 MIL/uL — AB (ref 3.70–5.45)
RDW: 15.3 % — AB (ref 11.2–14.5)
WBC Count: 6.2 10*3/uL (ref 3.9–10.3)

## 2017-12-28 LAB — CMP (CANCER CENTER ONLY)
ALT: 22 U/L (ref 0–55)
AST: 37 U/L — AB (ref 5–34)
Albumin: 2.9 g/dL — ABNORMAL LOW (ref 3.5–5.0)
Alkaline Phosphatase: 417 U/L — ABNORMAL HIGH (ref 40–150)
Anion gap: 5 (ref 3–11)
BILIRUBIN TOTAL: 1.2 mg/dL (ref 0.2–1.2)
BUN: 9 mg/dL (ref 7–26)
CO2: 25 mmol/L (ref 22–29)
CREATININE: 0.69 mg/dL (ref 0.60–1.10)
Calcium: 10.1 mg/dL (ref 8.4–10.4)
Chloride: 103 mmol/L (ref 98–109)
GFR, Est AFR Am: >60 mL/min (ref >60)
Glucose, Bld: 125 mg/dL (ref 70–140)
Potassium: 4 mmol/L (ref 3.5–5.1)
Sodium: 133 mmol/L — ABNORMAL LOW (ref 136–145)
TOTAL PROTEIN: 6.4 g/dL (ref 6.4–8.3)

## 2017-12-28 MED ORDER — PROCHLORPERAZINE MALEATE 10 MG PO TABS
ORAL_TABLET | ORAL | Status: AC
  Filled 2017-12-28: qty 1

## 2017-12-28 MED ORDER — GEMCITABINE HCL CHEMO INJECTION 1 GM/26.3ML
800.0000 mg/m2 | Freq: Once | INTRAVENOUS | Status: AC
  Administered 2017-12-28: 1368 mg via INTRAVENOUS
  Filled 2017-12-28: qty 35.98

## 2017-12-28 MED ORDER — PROCHLORPERAZINE MALEATE 10 MG PO TABS
5.0000 mg | ORAL_TABLET | Freq: Once | ORAL | Status: AC
  Administered 2017-12-28: 5 mg via ORAL

## 2017-12-28 MED ORDER — SODIUM CHLORIDE 0.9 % IV SOLN
Freq: Once | INTRAVENOUS | Status: AC
  Administered 2017-12-28: 16:00:00 via INTRAVENOUS

## 2017-12-28 NOTE — Patient Instructions (Signed)
[place] Discharge Instructions for Patients Receiving Chemotherapy  Today you received the following chemotherapy agents Gemzar  To help prevent nausea and vomiting after your treatment, we encourage you to take your nausea medication as prescribed.   If you develop nausea and vomiting that is not controlled by your nausea medication, call the clinic.   BELOW ARE SYMPTOMS THAT SHOULD BE REPORTED IMMEDIATELY:  *FEVER GREATER THAN 100.5 F  *CHILLS WITH OR WITHOUT FEVER  NAUSEA AND VOMITING THAT IS NOT CONTROLLED WITH YOUR NAUSEA MEDICATION  *UNUSUAL SHORTNESS OF BREATH  *UNUSUAL BRUISING OR BLEEDING  TENDERNESS IN MOUTH AND THROAT WITH OR WITHOUT PRESENCE OF ULCERS  *URINARY PROBLEMS  *BOWEL PROBLEMS  UNUSUAL RASH Items with * indicate a potential emergency and should be followed up as soon as possible.  Feel free to call the clinic should you have any questions or concerns. The clinic phone number is [phone].  Please show the [place] at check-in to the Emergency Department and triage nurse.  (Gemzar) Gemcitabine injection What is this medicine? GEMCITABINE (jem SIT a been) is a chemotherapy drug. This medicine is used to treat many types of cancer like breast cancer, lung cancer, pancreatic cancer, and ovarian cancer. This medicine may be used for other purposes; ask your health care provider or pharmacist if you have questions. COMMON BRAND NAME(S): Gemzar What should I tell my health care provider before I take this medicine? They need to know if you have any of these conditions: -blood disorders -infection -kidney disease -liver disease -recent or ongoing radiation therapy -an unusual or allergic reaction to gemcitabine, other chemotherapy, other medicines, foods, dyes, or preservatives -pregnant or trying to get pregnant -breast-feeding How should I use this medicine? This drug is given as an infusion into a vein. It is  administered in a hospital or clinic by a specially trained health care professional. Talk to your pediatrician regarding the use of this medicine in children. Special care may be needed. Overdosage: If you think you have taken too much of this medicine contact a poison control center or emergency room at once. NOTE: This medicine is only for you. Do not share this medicine with others. What if I miss a dose? It is important not to miss your dose. Call your doctor or health care professional if you are unable to keep an appointment. What may interact with this medicine? -medicines to increase blood counts like filgrastim, pegfilgrastim, sargramostim -some other chemotherapy drugs like cisplatin -vaccines Talk to your doctor or health care professional before taking any of these medicines: -acetaminophen -aspirin -ibuprofen -ketoprofen -naproxen This list may not describe all possible interactions. Give your health care provider a list of all the medicines, herbs, non-prescription drugs, or dietary supplements you use. Also tell them if you smoke, drink alcohol, or use illegal drugs. Some items may interact with your medicine. What should I watch for while using this medicine? Visit your doctor for checks on your progress. This drug may make you feel generally unwell. This is not uncommon, as chemotherapy can affect healthy cells as well as cancer cells. Report any side effects. Continue your course of treatment even though you feel ill unless your doctor tells you to stop. In some cases, you may be given additional medicines to help with side effects. Follow all directions for their use. Call your doctor or health care professional for advice if you get a fever, chills or sore throat, or other symptoms of a cold  or flu. Do not treat yourself. This drug decreases your body's ability to fight infections. Try to avoid being around people who are sick. This medicine may increase your risk to bruise  or bleed. Call your doctor or health care professional if you notice any unusual bleeding. Be careful brushing and flossing your teeth or using a toothpick because you may get an infection or bleed more easily. If you have any dental work done, tell your dentist you are receiving this medicine. Avoid taking products that contain aspirin, acetaminophen, ibuprofen, naproxen, or ketoprofen unless instructed by your doctor. These medicines may hide a fever. Women should inform their doctor if they wish to become pregnant or think they might be pregnant. There is a potential for serious side effects to an unborn child. Talk to your health care professional or pharmacist for more information. Do not breast-feed an infant while taking this medicine. What side effects may I notice from receiving this medicine? Side effects that you should report to your doctor or health care professional as soon as possible: -allergic reactions like skin rash, itching or hives, swelling of the face, lips, or tongue -low blood counts - this medicine may decrease the number of white blood cells, red blood cells and platelets. You may be at increased risk for infections and bleeding. -signs of infection - fever or chills, cough, sore throat, pain or difficulty passing urine -signs of decreased platelets or bleeding - bruising, pinpoint red spots on the skin, black, tarry stools, blood in the urine -signs of decreased red blood cells - unusually weak or tired, fainting spells, lightheadedness -breathing problems -chest pain -mouth sores -nausea and vomiting -pain, swelling, redness at site where injected -pain, tingling, numbness in the hands or feet -stomach pain -swelling of ankles, feet, hands -unusual bleeding Side effects that usually do not require medical attention (report to your doctor or health care professional if they continue or are bothersome): -constipation -diarrhea -hair loss -loss of appetite -stomach  upset This list may not describe all possible side effects. Call your doctor for medical advice about side effects. You may report side effects to FDA at 1-800-FDA-1088. Where should I keep my medicine? This drug is given in a hospital or clinic and will not be stored at home. NOTE: This sheet is a summary. It may not cover all possible information. If you have questions about this medicine, talk to your doctor, pharmacist, or health care provider.  2018 Elsevier/Gold Standard (2007-12-20 18:45:54)

## 2017-12-28 NOTE — Progress Notes (Signed)
12/28/17 @ 1530  Received call from Ned Card NP to inquire about altering rate to infuse longer as patient is nervous about drug burning during infusion.  I recommended that we dilute to a higher volume of 250 ml NS at the current ordered rate of 30 minutes and re-evaluate as the infusion goes in.  She agreed and a note was placed in the treatment plan.  T.O. Ned Card, NP/Odilon Cass Ronnald Ramp, PharmD

## 2017-12-28 NOTE — Progress Notes (Addendum)
  Carol Cook   Diagnosis: Pancreas cancer  INTERVAL HISTORY:   Carol Cook returns as scheduled.  She continues to have back pain.  Appetite remains poor.  She notes early satiety.  She has intermittent nausea.  No vomiting.  She is constipated periodically.  She takes MiraLAX as needed.  She then notes loose stools.  She reports she is "thirsty".  Objective:  Vital signs in last 24 hours:  Blood pressure 113/77, pulse 89, temperature 98 F (36.7 C), temperature source Oral, resp. rate 18, height '5\' 2"'$  (1.575 m), weight 148 lb 3.2 oz (67.2 kg), SpO2 99 %.    HEENT: Mild white coating over tongue.  No ulcers. Resp: Lungs clear bilaterally. Cardio: Regular rate and rhythm. GI: Abdomen soft and nontender.  No hepatomegaly. Vascular: No leg edema.    Lab Results:  Lab Results  Component Value Date   WBC 6.2 12/28/2017   HGB 10.7 (L) 12/28/2017   HCT 32.3 (L) 12/28/2017   MCV 90.2 12/28/2017   PLT 226 12/28/2017   NEUTROABS 5.2 12/28/2017    Imaging:  No results found.  Medications: I have reviewed the patient's current medications.  Assessment/Plan: 1. Adenocarcinoma of the pancreas head/uncinate, status post an EUS biopsy at [facility] 02/28/2017 ? CT abdomen/pelvis 06/25/2018-uncinate mass, abutment of the superior mesenteric artery, biliary obstruction ? MRCP 06/26/2017-pancreas head/uncinate mass with abutment of the superior mesenteric artery and abdominal aorta, compression of the splenic vein and SMV, biliary dilatation, small left pleural effusion ? SBRT12/13/2018-08/16/2017 ? CT abdomen/pelvis 10/01/2017-stable pancreas head/uncinate mass, stable peripancreatic lymph nodes, no evidence of progressive metastatic disease ? CTs 12/02/2017- innumerable bilateral pulmonary nodules, increased size of the pancreas head/uncinate mass, new T6 compression fracture ? Cycle 1 gemcitabine 12/28/2017   2. Obstructive jaundice secondary to  #1, status post placement of a biliary stent at [facility] 07/01/2017  3.COPD  4.Non-small cell lung cancer, hypermetabolic left upper lobe mass, favor adenocarcinoma, EGFR,ALK ,and ROS-1negative  [facility], 50 Gy in 5 fractions 05/01/2015-05/07/2015  CT chest 10/01/2016-no evidence of recurrent lung cancer  5.Anemia-improved 08/30/2017  6.Postprandial abdominal discomfort. Trial of Protonix initiated 08/30/2017    Disposition: Carol Cook appears unchanged.  Plan to proceed with cycle 1 gemcitabine today as scheduled.  She will return for lab and follow-up in 2 weeks.  She will contact the office in the interim with any problems.  We discussed a Port-A-Cath.  She would like to try treatment peripherally for now.  Patient seen with Dr. Benay Spice.  Ned Card ANP/GNP-BC   12/28/2017  2:31 PM As was a shared visit with Ned Card.  Carol Cook has decided to proceed with a trial of gemcitabine chemotherapy.  We reviewed potential toxicities associated with gemcitabine.  She will begin chemotherapy today.  Her daughter was present and agrees with the plan to begin gemcitabine chemotherapy.  She will return for an office visit in the next cycle of chemotherapy in 2 weeks.  Julieanne Manson, MD

## 2017-12-28 NOTE — Telephone Encounter (Signed)
Per 5/7 los - Unable to schedule due to capped day - logged - will contact patient when scheduled

## 2017-12-29 ENCOUNTER — Telehealth: Payer: Self-pay

## 2017-12-29 ENCOUNTER — Telehealth: Payer: Self-pay | Admitting: Nurse Practitioner

## 2017-12-29 LAB — CANCER ANTIGEN 19-9: CA 19-9: 872 U/mL — ABNORMAL HIGH (ref 0–35)

## 2017-12-29 NOTE — Telephone Encounter (Signed)
Call from pt daughter reporting "my mother had her first chemo yesterday and today her belly is very bloated". Pt daughter denies nausea/vomiting and confirms that the pt is drinking ok. Pt reports "diarrhea prior to bloating". Per Dr. Benay Spice, pt to continue to monitor symptoms, if vomiting or severe pain develop pt daughter to alert [place]. Pt daughter voiced understanding.

## 2017-12-29 NOTE — Telephone Encounter (Signed)
Scheduled appt per 5/7 los - patient is aware of appt date and time.

## 2017-12-30 ENCOUNTER — Telehealth: Payer: Self-pay

## 2017-12-30 NOTE — Telephone Encounter (Signed)
Returned call to pt daughter. Daughter states "my mom has been aching and is it ok to take Tylenol"?  LVM with daughter to inform that Per Dr. Benay Spice, ok to take Tylenol as needed.

## 2017-12-31 ENCOUNTER — Telehealth: Payer: Self-pay

## 2017-12-31 DIAGNOSIS — C250 Malignant neoplasm of head of pancreas: Secondary | ICD-10-CM

## 2017-12-31 MED ORDER — HYDROMORPHONE HCL 2 MG PO TABS: 2.0000 mg | ORAL_TABLET | Freq: Four times a day (QID) | ORAL | 0 refills | Status: DC | PRN

## 2017-12-31 NOTE — Telephone Encounter (Signed)
Called to inform that prescription paper is ready for pickup.   Pt daughter states "she's starting to require a little more than I can do. Is there any help I can get"? Will consult MD.   Per MD, pt can pursue private duty care agency. List of agencies provided. Daughter voiced concern with continuing treatment. Per Dr Benay Spice and daughter pt to follow up as scheduled for discussion. Daugter voiced understanding.

## 2018-01-03 ENCOUNTER — Telehealth: Payer: Self-pay | Admitting: Emergency Medicine

## 2018-01-03 NOTE — Telephone Encounter (Signed)
Pt daughter called on call center over weekend to c/o of severe abd pain. Pt was advised to go to ED but refused. Spoke to daughter today who states she is doing much better and pain is now under control. Offered her a visit sooner than her next appt on 5/21. Pt daughter refuses earlier appt at this time but knows to call if any issues arise. Routing note to MD

## 2018-01-06 ENCOUNTER — Encounter: Payer: Self-pay | Admitting: Nurse Practitioner

## 2018-01-06 ENCOUNTER — Inpatient Hospital Stay (HOSPITAL_BASED_OUTPATIENT_CLINIC_OR_DEPARTMENT_OTHER): Payer: PPO | Admitting: Nurse Practitioner

## 2018-01-06 ENCOUNTER — Telehealth: Payer: Self-pay | Admitting: *Deleted

## 2018-01-06 ENCOUNTER — Telehealth: Payer: Self-pay | Admitting: Nurse Practitioner

## 2018-01-06 VITALS — BP 125/70 | HR 80 | Temp 97.8°F | Resp 17 | Ht 62.0 in | Wt 143.6 lb

## 2018-01-06 DIAGNOSIS — Z5111 Encounter for antineoplastic chemotherapy: Secondary | ICD-10-CM | POA: Diagnosis not present

## 2018-01-06 DIAGNOSIS — K831 Obstruction of bile duct: Secondary | ICD-10-CM | POA: Diagnosis not present

## 2018-01-06 DIAGNOSIS — J449 Chronic obstructive pulmonary disease, unspecified: Secondary | ICD-10-CM | POA: Diagnosis not present

## 2018-01-06 DIAGNOSIS — Z85118 Personal history of other malignant neoplasm of bronchus and lung: Secondary | ICD-10-CM

## 2018-01-06 DIAGNOSIS — R531 Weakness: Secondary | ICD-10-CM | POA: Diagnosis not present

## 2018-01-06 DIAGNOSIS — C250 Malignant neoplasm of head of pancreas: Secondary | ICD-10-CM | POA: Diagnosis not present

## 2018-01-06 MED ORDER — HYDROMORPHONE HCL 2 MG PO TABS: 2.0000 mg | ORAL_TABLET | Freq: Four times a day (QID) | ORAL | 0 refills | Status: DC | PRN

## 2018-01-06 NOTE — Telephone Encounter (Signed)
Call from [employer] with hospice admissions, daughter called to request hospice evaluation.  Called daughter, she reports pt had her worst day ever on 5/15. She was weak and barely able to stand. Had terrible abdominal pain. Recommended she come in for evaluation today. Daughter will discuss with pt and call back if she declines visit.

## 2018-01-06 NOTE — Telephone Encounter (Signed)
No 5/16 los.

## 2018-01-06 NOTE — Progress Notes (Signed)
Carol Cook OFFICE PROGRESS NOTE   Diagnosis: Pancreas cancer  INTERVAL HISTORY:   Ms. Mair returns prior to scheduled follow-up.  She completed cycle 1 gemcitabine 12/28/2017.  She has mild intermittent nausea.  No vomiting.  She continues to have abdominal and back pain.  She is taking Dilaudid as needed.  She reports Dilaudid is effectively controlling the pain.  She states she is "tired".  She reports a "few bad days" since the chemotherapy with increased weakness.  She is intermittently confused.  On the bad days her family is having difficulty managing her care.  Recently they were unable to get her off of the toilet.  They report she has not been bathed in about 4 weeks.  Ms. Hagwood lives alone.  Her daughter lives across the street.  Her daughter-in-law and son live about a block away.  Objective:  Vital signs in last 24 hours:  Blood pressure 125/70, pulse 80, temperature 97.8 F (36.6 C), temperature source Oral, resp. rate 17, height 5' 2"  (1.575 m), weight 143 lb 9.6 oz (65.1 kg), SpO2 94 %.    HEENT: No thrush or ulcers. Resp: Lungs clear bilaterally. Cardio: Regular rate and rhythm. GI: Abdomen soft and nontender.  No hepatomegaly. Vascular: No leg edema. Neuro: Alert and oriented.    Lab Results:  Lab Results  Component Value Date   WBC 6.2 12/28/2017   HGB 10.7 (L) 12/28/2017   HCT 32.3 (L) 12/28/2017   MCV 90.2 12/28/2017   PLT 226 12/28/2017   NEUTROABS 5.2 12/28/2017    Imaging:  No results found.  Medications: I have reviewed the patient's current medications.  Assessment/Plan: 1. Adenocarcinoma of the pancreas head/uncinate, status post an EUS biopsy at [facility] 02/28/2017 ? CT abdomen/pelvis 06/25/2018-uncinate mass, abutment of the superior mesenteric artery, biliary obstruction ? MRCP 06/26/2017-pancreas head/uncinate mass with abutment of the superior mesenteric artery and abdominal aorta, compression of the splenic vein and SMV,  biliary dilatation, small left pleural effusion ? SBRT12/13/2018-08/16/2017 ? CT abdomen/pelvis 10/01/2017-stable pancreas head/uncinate mass, stable peripancreatic lymph nodes, no evidence of progressive metastatic disease ? CTs 12/02/2017- innumerable bilateral pulmonary nodules, increased size of the pancreas head/uncinate mass, new T6 compression fracture ? Cycle 1 gemcitabine 12/28/2017   2. Obstructive jaundice secondary to #1, status post placement of a biliary stent at [facility] 07/01/2017  3.COPD  4.Non-small cell lung cancer, hypermetabolic left upper lobe mass, favor adenocarcinoma, EGFR,ALK ,and ROS-1negative  SBRT at [facility], 50 Gy in 5 fractions 05/01/2015-05/07/2015  CT chest 10/01/2016-no evidence of recurrent lung cancer  5.Anemia-improved 08/30/2017  6.Postprandial abdominal discomfort. Trial of Protonix initiated 08/30/2017    Disposition: Ms. Lundblad appears unchanged.  She has completed 1 cycle of gemcitabine and seems to have tolerated this without significant acute toxicity.  Her family is having difficulty managing her care at home.  The [place] social worker met with Ms. Shrum and her family at today's visit.  We are making referrals to [facility] and for home PT/OT and nurse evaluations.  She will return as scheduled on 01/11/2018.  She will contact the office in the interim with any problems.  She was provided with a new Dilaudid prescription at today's visit.  Patient seen with Dr. Benay Spice.  Ned Card ANP/GNP-BC   01/06/2018  3:10 PM  This was a shared visit with Ned Card.  We discussed the current treatment plan with Ms. Williamson and her family.  We discussed discontinuing treatment and comfort care.  She would  like to continue gemcitabine chemotherapy for now.  Julieanne Manson, MD

## 2018-01-07 ENCOUNTER — Other Ambulatory Visit: Payer: Self-pay | Admitting: *Deleted

## 2018-01-07 DIAGNOSIS — C250 Malignant neoplasm of head of pancreas: Secondary | ICD-10-CM

## 2018-01-07 NOTE — Progress Notes (Signed)
Referral to [facility] [place] entered.

## 2018-01-08 ENCOUNTER — Encounter (HOSPITAL_COMMUNITY): Payer: Self-pay

## 2018-01-08 ENCOUNTER — Emergency Department (HOSPITAL_COMMUNITY): Payer: PPO

## 2018-01-08 ENCOUNTER — Inpatient Hospital Stay (HOSPITAL_COMMUNITY)
Admission: EM | Admit: 2018-01-08 | Discharge: 2018-01-10 | DRG: 193 | Disposition: A | Discharge status: Hospice | Payer: PPO | Attending: Internal Medicine | Admitting: Internal Medicine

## 2018-01-08 ENCOUNTER — Other Ambulatory Visit: Payer: Self-pay

## 2018-01-08 DIAGNOSIS — R627 Adult failure to thrive: Secondary | ICD-10-CM | POA: Diagnosis present

## 2018-01-08 DIAGNOSIS — J181 Lobar pneumonia, unspecified organism: Secondary | ICD-10-CM | POA: Diagnosis not present

## 2018-01-08 DIAGNOSIS — C7800 Secondary malignant neoplasm of unspecified lung: Secondary | ICD-10-CM | POA: Diagnosis present

## 2018-01-08 DIAGNOSIS — Z66 Do not resuscitate: Secondary | ICD-10-CM | POA: Diagnosis present

## 2018-01-08 DIAGNOSIS — J449 Chronic obstructive pulmonary disease, unspecified: Secondary | ICD-10-CM | POA: Diagnosis present

## 2018-01-08 DIAGNOSIS — D638 Anemia in other chronic diseases classified elsewhere: Secondary | ICD-10-CM | POA: Diagnosis not present

## 2018-01-08 DIAGNOSIS — C250 Malignant neoplasm of head of pancreas: Secondary | ICD-10-CM | POA: Diagnosis present

## 2018-01-08 DIAGNOSIS — Z85118 Personal history of other malignant neoplasm of bronchus and lung: Secondary | ICD-10-CM

## 2018-01-08 DIAGNOSIS — Z96641 Presence of right artificial hip joint: Secondary | ICD-10-CM | POA: Diagnosis not present

## 2018-01-08 DIAGNOSIS — R509 Fever, unspecified: Secondary | ICD-10-CM

## 2018-01-08 DIAGNOSIS — E876 Hypokalemia: Secondary | ICD-10-CM | POA: Diagnosis not present

## 2018-01-08 DIAGNOSIS — C799 Secondary malignant neoplasm of unspecified site: Secondary | ICD-10-CM

## 2018-01-08 DIAGNOSIS — L89151 Pressure ulcer of sacral region, stage 1: Secondary | ICD-10-CM | POA: Diagnosis present

## 2018-01-08 DIAGNOSIS — Z87891 Personal history of nicotine dependence: Secondary | ICD-10-CM

## 2018-01-08 DIAGNOSIS — Z515 Encounter for palliative care: Secondary | ICD-10-CM | POA: Diagnosis not present

## 2018-01-08 DIAGNOSIS — T451X5A Adverse effect of antineoplastic and immunosuppressive drugs, initial encounter: Secondary | ICD-10-CM | POA: Diagnosis not present

## 2018-01-08 DIAGNOSIS — Z7189 Other specified counseling: Secondary | ICD-10-CM | POA: Diagnosis not present

## 2018-01-08 DIAGNOSIS — Z923 Personal history of irradiation: Secondary | ICD-10-CM | POA: Diagnosis not present

## 2018-01-08 DIAGNOSIS — Z6826 Body mass index (BMI) 26.0-26.9, adult: Secondary | ICD-10-CM | POA: Diagnosis not present

## 2018-01-08 DIAGNOSIS — R748 Abnormal levels of other serum enzymes: Secondary | ICD-10-CM | POA: Diagnosis not present

## 2018-01-08 DIAGNOSIS — R7989 Other specified abnormal findings of blood chemistry: Secondary | ICD-10-CM | POA: Diagnosis not present

## 2018-01-08 DIAGNOSIS — R531 Weakness: Secondary | ICD-10-CM | POA: Diagnosis not present

## 2018-01-08 DIAGNOSIS — J189 Pneumonia, unspecified organism: Secondary | ICD-10-CM | POA: Diagnosis present

## 2018-01-08 DIAGNOSIS — D630 Anemia in neoplastic disease: Secondary | ICD-10-CM | POA: Diagnosis not present

## 2018-01-08 DIAGNOSIS — E46 Unspecified protein-calorie malnutrition: Secondary | ICD-10-CM | POA: Diagnosis not present

## 2018-01-08 DIAGNOSIS — Z9221 Personal history of antineoplastic chemotherapy: Secondary | ICD-10-CM | POA: Diagnosis not present

## 2018-01-08 DIAGNOSIS — Z9049 Acquired absence of other specified parts of digestive tract: Secondary | ICD-10-CM | POA: Diagnosis not present

## 2018-01-08 DIAGNOSIS — J440 Chronic obstructive pulmonary disease with acute lower respiratory infection: Secondary | ICD-10-CM | POA: Diagnosis present

## 2018-01-08 DIAGNOSIS — C7802 Secondary malignant neoplasm of left lung: Secondary | ICD-10-CM | POA: Diagnosis not present

## 2018-01-08 DIAGNOSIS — C259 Malignant neoplasm of pancreas, unspecified: Secondary | ICD-10-CM

## 2018-01-08 DIAGNOSIS — K831 Obstruction of bile duct: Secondary | ICD-10-CM | POA: Diagnosis not present

## 2018-01-08 DIAGNOSIS — Z96652 Presence of left artificial knee joint: Secondary | ICD-10-CM | POA: Diagnosis not present

## 2018-01-08 DIAGNOSIS — D6481 Anemia due to antineoplastic chemotherapy: Secondary | ICD-10-CM | POA: Diagnosis not present

## 2018-01-08 DIAGNOSIS — R5081 Fever presenting with conditions classified elsewhere: Secondary | ICD-10-CM | POA: Diagnosis not present

## 2018-01-08 LAB — URINALYSIS, ROUTINE W REFLEX MICROSCOPIC
BILIRUBIN URINE: NEGATIVE
Glucose, UA: NEGATIVE mg/dL
HGB URINE DIPSTICK: NEGATIVE
Ketones, ur: NEGATIVE mg/dL
Leukocytes, UA: NEGATIVE
Nitrite: NEGATIVE
PROTEIN: NEGATIVE mg/dL
Specific Gravity, Urine: 1.011 (ref 1.005–1.030)
pH: 7 (ref 5.0–8.0)

## 2018-01-08 LAB — CBC WITH DIFFERENTIAL/PLATELET
Basophils Absolute: 0 10*3/uL (ref 0.0–0.1)
Basophils Relative: 0 %
EOS ABS: 0 10*3/uL (ref 0.0–0.7)
EOS PCT: 0 %
HCT: 27.3 % — ABNORMAL LOW (ref 36.0–46.0)
HEMOGLOBIN: 9.3 g/dL — AB (ref 12.0–15.0)
LYMPHS ABS: 0.4 10*3/uL — AB (ref 0.7–4.0)
Lymphocytes Relative: 7 %
MCH: 29.8 pg (ref 26.0–34.0)
MCHC: 34.1 g/dL (ref 30.0–36.0)
MCV: 87.5 fL (ref 78.0–100.0)
MONOS PCT: 7 %
Monocytes Absolute: 0.4 10*3/uL (ref 0.1–1.0)
Neutro Abs: 4.9 10*3/uL (ref 1.7–7.7)
Neutrophils Relative %: 86 %
PLATELETS: 168 10*3/uL (ref 150–400)
RBC: 3.12 MIL/uL — ABNORMAL LOW (ref 3.87–5.11)
RDW: 15.3 % (ref 11.5–15.5)
WBC: 5.7 10*3/uL (ref 4.0–10.5)

## 2018-01-08 LAB — COMPREHENSIVE METABOLIC PANEL
ALT: 52 U/L (ref 14–54)
AST: 73 U/L — AB (ref 15–41)
Albumin: 2.8 g/dL — ABNORMAL LOW (ref 3.5–5.0)
Alkaline Phosphatase: 1282 U/L — ABNORMAL HIGH (ref 38–126)
Anion gap: 13 (ref 5–15)
BILIRUBIN TOTAL: 2.2 mg/dL — AB (ref 0.3–1.2)
BUN: 9 mg/dL (ref 6–20)
CHLORIDE: 95 mmol/L — AB (ref 101–111)
CO2: 23 mmol/L (ref 22–32)
CREATININE: 0.62 mg/dL (ref 0.44–1.00)
Calcium: 9.5 mg/dL (ref 8.9–10.3)
Glucose, Bld: 120 mg/dL — ABNORMAL HIGH (ref 65–99)
Potassium: 3.8 mmol/L (ref 3.5–5.1)
Sodium: 131 mmol/L — ABNORMAL LOW (ref 135–145)
Total Protein: 6.4 g/dL — ABNORMAL LOW (ref 6.5–8.1)

## 2018-01-08 LAB — I-STAT CG4 LACTIC ACID, ED
LACTIC ACID, VENOUS: 1.56 mmol/L (ref 0.5–1.9)
Lactic Acid, Venous: 1.72 mmol/L (ref 0.5–1.9)

## 2018-01-08 MED ORDER — ACETAMINOPHEN 650 MG RE SUPP: 650.0000 mg | Freq: Four times a day (QID) | RECTAL | Status: DC | PRN

## 2018-01-08 MED ORDER — ENOXAPARIN SODIUM 40 MG/0.4ML ~~LOC~~ SOLN
40.0000 mg | Freq: Every day | SUBCUTANEOUS | Status: DC
  Administered 2018-01-09 – 2018-01-10 (×2): 40 mg via SUBCUTANEOUS
  Filled 2018-01-08 (×2): qty 0.4

## 2018-01-08 MED ORDER — MIRTAZAPINE 30 MG PO TABS: 15.0000 mg | ORAL_TABLET | Freq: Every day | ORAL | Status: DC

## 2018-01-08 MED ORDER — AZITHROMYCIN 250 MG PO TABS: 500.0000 mg | ORAL_TABLET | ORAL | Status: DC

## 2018-01-08 MED ORDER — ESCITALOPRAM OXALATE 10 MG PO TABS
10.0000 mg | ORAL_TABLET | Freq: Every day | ORAL | Status: DC
  Administered 2018-01-09 – 2018-01-10 (×2): 10 mg via ORAL
  Filled 2018-01-08 (×2): qty 1

## 2018-01-08 MED ORDER — ONDANSETRON HCL 4 MG/2ML IJ SOLN
4.0000 mg | Freq: Four times a day (QID) | INTRAMUSCULAR | Status: DC | PRN
  Administered 2018-01-09: 4 mg via INTRAVENOUS
  Filled 2018-01-08: qty 2

## 2018-01-08 MED ORDER — SODIUM CHLORIDE 0.9 % IV SOLN
1.0000 g | INTRAVENOUS | Status: DC
  Administered 2018-01-09 – 2018-01-10 (×2): 1 g via INTRAVENOUS
  Filled 2018-01-08 (×2): qty 1

## 2018-01-08 MED ORDER — HYDROMORPHONE HCL 2 MG PO TABS: 2.0000 mg | ORAL_TABLET | Freq: Four times a day (QID) | ORAL | Status: DC | PRN

## 2018-01-08 MED ORDER — POLYETHYLENE GLYCOL 3350 17 G PO PACK
17.0000 g | PACK | Freq: Every day | ORAL | Status: DC
  Administered 2018-01-09: 17 g via ORAL
  Filled 2018-01-08: qty 1

## 2018-01-08 MED ORDER — TEMAZEPAM 15 MG PO CAPS: 15.0000 mg | ORAL_CAPSULE | Freq: Every evening | ORAL | Status: DC | PRN

## 2018-01-08 MED ORDER — VITAMIN D3 25 MCG (1000 UNIT) PO TABS
1000.0000 [IU] | ORAL_TABLET | Freq: Every day | ORAL | Status: DC
  Administered 2018-01-09 – 2018-01-10 (×2): 1000 [IU] via ORAL
  Filled 2018-01-08 (×2): qty 1

## 2018-01-08 MED ORDER — SODIUM CHLORIDE 0.9 % IV SOLN
500.0000 mg | Freq: Once | INTRAVENOUS | Status: AC
  Administered 2018-01-08: 500 mg via INTRAVENOUS
  Filled 2018-01-08: qty 500

## 2018-01-08 MED ORDER — UMECLIDINIUM BROMIDE 62.5 MCG/INH IN AEPB
1.0000 | INHALATION_SPRAY | Freq: Every day | RESPIRATORY_TRACT | Status: DC
  Filled 2018-01-08: qty 7

## 2018-01-08 MED ORDER — ARFORMOTEROL TARTRATE 15 MCG/2ML IN NEBU
15.0000 ug | INHALATION_SOLUTION | Freq: Two times a day (BID) | RESPIRATORY_TRACT | Status: DC
  Administered 2018-01-09 – 2018-01-10 (×2): 15 ug via RESPIRATORY_TRACT
  Filled 2018-01-08 (×5): qty 2

## 2018-01-08 MED ORDER — PANTOPRAZOLE SODIUM 40 MG PO TBEC
40.0000 mg | DELAYED_RELEASE_TABLET | Freq: Every day | ORAL | Status: DC
  Administered 2018-01-09 – 2018-01-10 (×2): 40 mg via ORAL
  Filled 2018-01-08 (×2): qty 1

## 2018-01-08 MED ORDER — ACETAMINOPHEN 325 MG PO TABS: 650.0000 mg | ORAL_TABLET | Freq: Four times a day (QID) | ORAL | Status: DC | PRN

## 2018-01-08 MED ORDER — FLUTICASONE PROPIONATE 50 MCG/ACT NA SUSP
2.0000 | Freq: Every day | NASAL | Status: DC
  Filled 2018-01-08: qty 16

## 2018-01-08 MED ORDER — ONDANSETRON HCL 4 MG PO TABS: 4.0000 mg | ORAL_TABLET | Freq: Four times a day (QID) | ORAL | Status: DC | PRN

## 2018-01-08 MED ORDER — SODIUM CHLORIDE 0.9 % IV SOLN
1.0000 g | Freq: Once | INTRAVENOUS | Status: AC
  Administered 2018-01-08: 1 g via INTRAVENOUS
  Filled 2018-01-08: qty 10

## 2018-01-08 MED ORDER — TEMAZEPAM 7.5 MG PO CAPS
7.5000 mg | ORAL_CAPSULE | Freq: Every evening | ORAL | Status: DC | PRN
  Administered 2018-01-09 – 2018-01-10 (×2): 7.5 mg via ORAL
  Filled 2018-01-08 (×5): qty 1

## 2018-01-08 MED ORDER — ALBUTEROL SULFATE (2.5 MG/3ML) 0.083% IN NEBU: 3.0000 mL | INHALATION_SOLUTION | RESPIRATORY_TRACT | Status: DC | PRN

## 2018-01-08 NOTE — ED Notes (Signed)
ED TO INPATIENT HANDOFF REPORT  Name/Age/Gender Carol Cook 84 y.o. female  Code Status    Code Status Orders  (From admission, onward)        Start     Ordered   01/08/18 2130  Do not attempt resuscitation (DNR)  Continuous    Question Answer Comment  In the event of cardiac or respiratory ARREST Do not call a "code blue"   In the event of cardiac or respiratory ARREST Do not perform Intubation, CPR, defibrillation or ACLS   In the event of cardiac or respiratory ARREST Use medication by any route, position, wound care, and other measures to relive pain and suffering. May use oxygen, suction and manual treatment of airway obstruction as needed for comfort.      01/08/18 2133    Code Status History    Date Active Date Inactive Code Status Order ID Comments User Context   06/25/2017 2349 06/28/2017 2022 Full Code [id number]  Etta Quill, DO ED      Home/SNF/Other Home  Chief Complaint weakness, chills, fever  Level of Care/Admitting Diagnosis ED Disposition    ED Disposition Condition [place]: [facility] [100102]  Level of Care: Med-Surg [16]  Diagnosis: CAP (community acquired pneumonia) [016010]  Admitting Physician: Etta Quill [phone]  Attending Physician: Etta Quill [phone]  Estimated length of stay: past midnight tomorrow  Certification:: I certify this patient will need inpatient services for at least 2 midnights  PT Class (Do Not Modify): Inpatient [101]  PT Acc Code (Do Not Modify): Private [1]       Medical History Past Medical History:  Diagnosis Date  . COPD (chronic obstructive pulmonary disease) ([place])   . Non-small cell lung cancer (NSCLC) ([place])   . Pancreatic cancer ([place]) 07/01/2017   Adenocarcinoma  . Post-radiation pneumonitis ([facility])     Allergies No Known Allergies  IV Location/Drains/Wounds Patient Lines/Drains/Airways Status   Active Line/Drains/Airways    Name:   Placement  date:   Placement time:   Site:   Days:   Peripheral IV 01/08/18 Right Antecubital   01/08/18    1841    Antecubital   less than 1          Labs/Imaging Results for orders placed or performed during the hospital encounter of 01/08/18 (from the past 48 hour(s))  Comprehensive metabolic panel     Status: Abnormal   Collection Time: 01/08/18  5:38 PM  Result Value Ref Range   Sodium 131 (L) 135 - 145 mmol/L   Potassium 3.8 3.5 - 5.1 mmol/L   Chloride 95 (L) 101 - 111 mmol/L   CO2 23 22 - 32 mmol/L   Glucose, Bld 120 (H) 65 - 99 mg/dL   BUN 9 6 - 20 mg/dL   Creatinine, Ser 0.62 0.44 - 1.00 mg/dL   Calcium 9.5 8.9 - 10.3 mg/dL   Total Protein 6.4 (L) 6.5 - 8.1 g/dL   Albumin 2.8 (L) 3.5 - 5.0 g/dL   AST 73 (H) 15 - 41 U/L   ALT 52 14 - 54 U/L   Alkaline Phosphatase 1,282 (H) 38 - 126 U/L   Total Bilirubin 2.2 (H) 0.3 - 1.2 mg/dL   GFR calc non Af Amer >60 >60 mL/min   GFR calc Af Amer >60 >60 mL/min    Comment: (NOTE) The eGFR has been calculated using the CKD EPI equation. This calculation has not been validated in all clinical  situations. eGFR's persistently <60 mL/min signify possible Chronic Kidney Disease.    Anion gap 13 5 - 15    Comment: Performed at [facility], [place] [address] [place], [place] [address]  CBC WITH DIFFERENTIAL     Status: Abnormal   Collection Time: 01/08/18  5:38 PM  Result Value Ref Range   WBC 5.7 4.0 - 10.5 K/uL   RBC 3.12 (L) 3.87 - 5.11 MIL/uL   Hemoglobin 9.3 (L) 12.0 - 15.0 g/dL   HCT 27.3 (L) 36.0 - 46.0 %   MCV 87.5 78.0 - 100.0 fL   MCH 29.8 26.0 - 34.0 pg   MCHC 34.1 30.0 - 36.0 g/dL   RDW 15.3 11.5 - 15.5 %   Platelets 168 150 - 400 K/uL   Neutrophils Relative % 86 %   Neutro Abs 4.9 1.7 - 7.7 K/uL   Lymphocytes Relative 7 %   Lymphs Abs 0.4 (L) 0.7 - 4.0 K/uL   Monocytes Relative 7 %   Monocytes Absolute 0.4 0.1 - 1.0 K/uL   Eosinophils Relative 0 %   Eosinophils Absolute 0.0 0.0 - 0.7 K/uL   Basophils  Relative 0 %   Basophils Absolute 0.0 0.0 - 0.1 K/uL    Comment: Performed at [facility], [place] [address] [place], [place] [address]  Urinalysis, Routine w reflex microscopic     Status: Abnormal   Collection Time: 01/08/18  5:38 PM  Result Value Ref Range   Color, Urine AMBER (A) YELLOW    Comment: BIOCHEMICALS MAY BE AFFECTED BY COLOR   APPearance CLEAR CLEAR   Specific Gravity, Urine 1.011 1.005 - 1.030   pH 7.0 5.0 - 8.0   Glucose, UA NEGATIVE NEGATIVE mg/dL   Hgb urine dipstick NEGATIVE NEGATIVE   Bilirubin Urine NEGATIVE NEGATIVE   Ketones, ur NEGATIVE NEGATIVE mg/dL   Protein, ur NEGATIVE NEGATIVE mg/dL   Nitrite NEGATIVE NEGATIVE   Leukocytes, UA NEGATIVE NEGATIVE    Comment: Performed at [place] [address], [place], [place] [address]  I-Stat CG4 Lactic Acid, ED  (not at  [facility])     Status: None   Collection Time: 01/08/18  6:50 PM  Result Value Ref Range   Lactic Acid, Venous 1.72 0.5 - 1.9 mmol/L  I-Stat CG4 Lactic Acid, ED  (not at  [facility])     Status: None   Collection Time: 01/08/18  8:06 PM  Result Value Ref Range   Lactic Acid, Venous 1.56 0.5 - 1.9 mmol/L   Dg Chest Port 1 View  Result Date: 01/08/2018 CLINICAL DATA:  Fever.  Pancreatic cancer.  History of lung cancer. EXAM: PORTABLE CHEST 1 VIEW COMPARISON:  CT 12/02/2017 FINDINGS: Stable density in the left mid lung, likely postradiation/treatment changes. Left lower lobe atelectasis or infiltrate. Suspect small left effusion. The innumerable previously seen small subcentimeter pulmonary nodules are again noted compatible with widespread metastases. Mild cardiomegaly. Degenerative changes in the shoulders. IMPRESSION: Numerous tiny scattered pulmonary nodules as seen on prior chest CT compatible with metastases. Postradiation changes in the left mid lung. Left lower lobe atelectasis or infiltrate with small left effusion. Electronically Signed   By: Rolm Baptise M.D.   On:  01/08/2018 18:06    Pending Labs Unresulted Labs (From admission, onward)   Start     Ordered   01/09/18 0500  CBC  Tomorrow morning,   R     01/08/18 2133   01/09/18 0500  Comprehensive metabolic panel  Tomorrow morning,   R     01/08/18 2133   01/08/18 2137  HIV antibody (Routine Screening)  Once,   R     01/08/18 2137   01/08/18 2137  Culture, sputum-assessment  Once,   R     01/08/18 2137   01/08/18 2137  Gram stain  Once,   R     01/08/18 2137   01/08/18 2137  Strep pneumoniae urinary antigen  Once,   R     01/08/18 2137   01/08/18 1738  Blood Culture (routine x 2)  BLOOD CULTURE X 2,   STAT     01/08/18 1738   01/08/18 1738  Urine culture  STAT,   STAT     01/08/18 1738      Vitals/Pain Today's Vitals   01/08/18 2000 01/08/18 2030 01/08/18 2100 01/08/18 2127  BP: 122/79 112/72 122/77   Pulse: 86 95 75   Resp: (!) 24 16 18    Temp:      TempSrc:      SpO2: 94% 95% 94%   Weight:      PainSc:    0-No pain    Isolation Precautions No active isolations  Medications Medications  cholecalciferol (VITAMIN D) tablet 1,000 Units (has no administration in time range)  fluticasone (FLONASE) 50 MCG/ACT nasal spray 2 spray (has no administration in time range)  HYDROmorphone (DILAUDID) tablet 2-4 mg (has no administration in time range)  arformoterol (BROVANA) nebulizer solution 15 mcg (has no administration in time range)  umeclidinium bromide (INCRUSE ELLIPTA) 62.5 MCG/INH 1 puff (has no administration in time range)  albuterol (PROVENTIL HFA;VENTOLIN HFA) 108 (90 Base) MCG/ACT inhaler 1-2 puff (has no administration in time range)  polyethylene glycol powder (GLYCOLAX/MIRALAX) container 17 g (has no administration in time range)  escitalopram (LEXAPRO) tablet 10 mg (has no administration in time range)  omeprazole (PRILOSEC OTC) EC tablet 20 mg (has no administration in time range)  acetaminophen (TYLENOL) tablet 650 mg (has no administration in time range)    Or   acetaminophen (TYLENOL) suppository 650 mg (has no administration in time range)  ondansetron (ZOFRAN) tablet 4 mg (has no administration in time range)    Or  ondansetron (ZOFRAN) injection 4 mg (has no administration in time range)  enoxaparin (LOVENOX) injection 40 mg (has no administration in time range)  temazepam (RESTORIL) capsule 7.5 mg (has no administration in time range)  cefTRIAXone (ROCEPHIN) 1 g in sodium chloride 0.9 % 100 mL IVPB (has no administration in time range)  azithromycin (ZITHROMAX) tablet 500 mg (has no administration in time range)  cefTRIAXone (ROCEPHIN) 1 g in sodium chloride 0.9 % 100 mL IVPB (0 g Intravenous Stopped 01/08/18 2043)  azithromycin (ZITHROMAX) 500 mg in sodium chloride 0.9 % 250 mL IVPB (0 mg Intravenous Stopped 01/08/18 2126)    Mobility manual wheelchair

## 2018-01-08 NOTE — H&P (Signed)
History and Physical    Carol Cook XTG:[id number] DOB: 1926/12/15 DOA: 01/08/2018  PCP: Velna Hatchet, MD  Patient coming from: Home  I have personally briefly reviewed patient's old medical records in [place]  Chief Complaint: Fever  HPI: Carol Cook is a 84 y.o. female with medical history significant of metastatic pancreatic cancer Dx in Nov.  Did first round of chemo x2 weeks ago.  Patient presents to the ED with c/o fever up to 101 at home.  No headache, she states no cough (family says she has mild cough), she states no abd pain currently (family states she frequently has severe abd pain ongoing due to cancer).  No rash, no dysuria.   ED Course: Possible LLL PNA on CXR.  Alk phos up to 1280.  T bili 2.2, AST 73, ALT 52.  Lactate neg x2.  Started on rocephin / azithro for presumed CAP.  UA neg.   Review of Systems: As per HPI otherwise 10 point review of systems negative.   Past Medical History:  Diagnosis Date  . COPD (chronic obstructive pulmonary disease) ([place])   . Non-small cell lung cancer (NSCLC) ([place])   . Pancreatic cancer ([place]) 07/01/2017   Adenocarcinoma  . Post-radiation pneumonitis [facility])     Past Surgical History:  Procedure Laterality Date  . APPENDECTOMY    . cataracts     surgery  . CHOLECYSTECTOMY    . REPLACEMENT TOTAL KNEE Left   . TOTAL HIP ARTHROPLASTY Right      reports that she quit smoking about 6 years ago. Her smoking use included cigarettes. She has a 45.00 pack-year smoking history. She has never used smokeless tobacco. She reports that she drinks alcohol. She reports that she does not use drugs.  No Known Allergies  Family History  Problem Relation Age of Onset  . Dementia Mother   . Hypertension Mother   . Deep vein thrombosis Father      Prior to Admission medications   Medication Sig Start Date End Date Taking? Authorizing Provider  Carboxymethylcellulose Sodium (EYE DROPS OP) Place 1 drop into both eyes daily as  needed (moisten eyes).   Yes [provider]  cholecalciferol (VITAMIN D) 1000 units tablet Take 1,000 Units by mouth daily.   Yes [provider]  escitalopram (LEXAPRO) 10 MG tablet TK 1 T PO D 10/15/17  Yes [provider]  HYDROmorphone (DILAUDID) 2 MG tablet Take 1-2 tablets (2-4 mg total) by mouth every 6 (six) hours as needed for severe pain. 01/06/18  Yes Owens Shark, NP  mirtazapine (REMERON) 15 MG tablet TK 1 T PO HS FOR SLEEP AND APPETITE 12/06/17  Yes [provider]  Multiple Vitamins-Minerals (OPTIVITE PO) Take 1 capsule by mouth daily.   Yes [provider]  omeprazole (PRILOSEC OTC) 20 MG tablet Take 20 mg by mouth daily.   Yes [provider]  pantoprazole (PROTONIX) 40 MG tablet TAKE 1 TABLET(40 MG) BY MOUTH DAILY 12/23/17  Yes Ladell Pier, MD  polyethylene glycol powder (MIRALAX) powder Take 17 g by mouth daily. 11/03/17  Yes Palumbo, April, MD  PROAIR HFA 108 (90 Base) MCG/ACT inhaler INHALE 1 PUFF INTO LUNGS EVERY 4 HOURS AS NEEDED 04/14/17  Yes Byrum, Rose Fillers, MD  temazepam (RESTORIL) 15 MG capsule Take 1 capsule (15 mg total) by mouth at bedtime as needed for sleep. 08/16/17  Yes Hayden Pedro, PA-C  Tiotropium Bromide-Olodaterol (STIOLTO RESPIMAT) 2.5-2.5 MCG/ACT AERS Inhale 2 puffs into  the lungs daily. 09/20/17  Yes Collene Gobble, MD  vitamin C (ASCORBIC ACID) 500 MG tablet Take 500 mg by mouth daily.   Yes [provider]  vitamin E 400 UNIT capsule Take 400 Units by mouth daily.   Yes [provider]  fluticasone (FLONASE) 50 MCG/ACT nasal spray Place 2 sprays into both nostrils daily. Patient not taking: Reported on 01/08/2018 03/26/16   Collene Gobble, MD  ondansetron (ZOFRAN) 8 MG tablet TAKE 1 TABLET(8 MG) BY MOUTH EVERY 8 HOURS AS NEEDED FOR NAUSEA OR VOMITING 12/24/17   Owens Shark, NP  prednisoLONE acetate (PRED FORTE) 1 % ophthalmic suspension INT 1 GTT IN OD D START ON mon. wed and  friday 04/14/17   [provider]    Physical Exam: Vitals:   01/08/18 1958 01/08/18 2000 01/08/18 2030 01/08/18 2100  BP: 124/74 122/79 112/72 122/77  Pulse: 80 86 95 75  Resp: 17 (!) 24 16 18   Temp:      TempSrc:      SpO2: 99% 94% 95% 94%  Weight:        Constitutional: NAD, calm, comfortable Eyes: PERRL, lids and conjunctivae normal ENMT: Mucous membranes are moist. Posterior pharynx clear of any exudate or lesions.Normal dentition.  Neck: normal, supple, no masses, no thyromegaly Respiratory: clear to auscultation bilaterally, no wheezing, no crackles. Normal respiratory effort. No accessory muscle use.  Cardiovascular: Regular rate and rhythm, no murmurs / rubs / gallops. No extremity edema. 2+ pedal pulses. No carotid bruits.  Abdomen: No tenderness, no guarding, no rebound Musculoskeletal: no clubbing / cyanosis. No joint deformity upper and lower extremities. Good ROM, no contractures. Normal muscle tone.  Skin: no rashes, lesions, ulcers. No induration Neurologic: CN 2-12 grossly intact. Sensation intact, DTR normal. Strength 5/5 in all 4.  Psychiatric: Normal judgment and insight. Alert and oriented x 3. Normal mood.    Labs on Admission: I have personally reviewed following labs and imaging studies  CBC: Recent Labs  Lab 01/08/18 1738  WBC 5.7  NEUTROABS 4.9  HGB 9.3*  HCT 27.3*  MCV 87.5  PLT 992   Basic Metabolic Panel: Recent Labs  Lab 01/08/18 1738  NA 131*  K 3.8  CL 95*  CO2 23  GLUCOSE 120*  BUN 9  CREATININE 0.62  CALCIUM 9.5   GFR: Estimated Creatinine Clearance: 42.1 mL/min (by C-G formula based on SCr of 0.62 mg/dL). Liver Function Tests: Recent Labs  Lab 01/08/18 1738  AST 73*  ALT 52  ALKPHOS 1,282*  BILITOT 2.2*  PROT 6.4*  ALBUMIN 2.8*   No results for input(s): LIPASE, AMYLASE in the last 168 hours. No results for input(s): AMMONIA in the last 168 hours. Coagulation Profile: No results for input(s): INR, PROTIME  in the last 168 hours. Cardiac Enzymes: No results for input(s): CKTOTAL, CKMB, CKMBINDEX, TROPONINI in the last 168 hours. BNP (last 3 results) No results for input(s): PROBNP in the last 8760 hours. HbA1C: No results for input(s): HGBA1C in the last 72 hours. CBG: No results for input(s): GLUCAP in the last 168 hours. Lipid Profile: No results for input(s): CHOL, HDL, LDLCALC, TRIG, CHOLHDL, LDLDIRECT in the last 72 hours. Thyroid Function Tests: No results for input(s): TSH, T4TOTAL, FREET4, T3FREE, THYROIDAB in the last 72 hours. Anemia Panel: No results for input(s): VITAMINB12, FOLATE, FERRITIN, TIBC, IRON, RETICCTPCT in the last 72 hours. Urine analysis:    Component Value Date/Time   COLORURINE AMBER (A) 01/08/2018 1738  APPEARANCEUR CLEAR 01/08/2018 1738   LABSPEC 1.011 01/08/2018 1738   PHURINE 7.0 01/08/2018 1738   GLUCOSEU NEGATIVE 01/08/2018 1738   HGBUR NEGATIVE 01/08/2018 1738   BILIRUBINUR NEGATIVE 01/08/2018 [place] 01/08/2018 1738   PROTEINUR NEGATIVE 01/08/2018 1738   UROBILINOGEN >=8.0 06/25/2017 1636   NITRITE NEGATIVE 01/08/2018 1738   LEUKOCYTESUR NEGATIVE 01/08/2018 1738    Radiological Exams on Admission: Dg Chest Port 1 View  Result Date: 01/08/2018 CLINICAL DATA:  Fever.  Pancreatic cancer.  History of lung cancer. EXAM: PORTABLE CHEST 1 VIEW COMPARISON:  CT 12/02/2017 FINDINGS: Stable density in the left mid lung, likely postradiation/treatment changes. Left lower lobe atelectasis or infiltrate. Suspect small left effusion. The innumerable previously seen small subcentimeter pulmonary nodules are again noted compatible with widespread metastases. Mild cardiomegaly. Degenerative changes in the shoulders. IMPRESSION: Numerous tiny scattered pulmonary nodules as seen on prior chest CT compatible with metastases. Postradiation changes in the left mid lung. Left lower lobe atelectasis or infiltrate with small left effusion. Electronically  Signed   By: Rolm Baptise M.D.   On: 01/08/2018 18:06    EKG: Independently reviewed.  Assessment/Plan Principal Problem:   CAP (community acquired pneumonia) Active Problems:   Primary cancer of head of pancreas ([place])    1. LLL CAP - 1. PNA pathway 2. Will leave on rocephin / azithro for now 3. Sputum Cx, BCx, UCx pending 2. Pancreatic cancer - 1. Sounds like patients functional status is much worse than she has been telling her oncologist from my discussion with family. 2. Family req pal care consult 1. Family is concerned patient has unrealistic expectation that she is going to be cured of cancer and is in denial. 2. Family more interested in quality time than longevity. 3. Will also put in onc consult 4. [place] RUQ given marked elevation in alk phos. 5. Leave on PO dilaudid PRN for now  DVT prophylaxis: Lovenox Code Status: DNR - confirmed with patient Family Communication: Family at bedside Disposition Plan: TBD Consults called: Consults put into Epic to Oncology and [place] care Admission status: Admit to inpatient   [place] [place] [facility] Pager [phone]  If 7AM-7PM, please contact day team taking care of patient [email or web address] Password [facility]  01/08/2018, 9:42 PM

## 2018-01-08 NOTE — ED Notes (Signed)
ED Provider at bedside. 

## 2018-01-08 NOTE — ED Provider Notes (Signed)
[facility] DEPT Provider Note   CSN: [id number] Arrival date & time: 01/08/18  1707     History   Chief Complaint Chief Complaint  Patient presents with  . Fever    HPI Carol Cook is a 84 y.o. female.  Patient with hx pancreatic ca, presents w fever. t 101. Symptoms acute onset, moderate, persistent. No specific exacerbating or alleviating factors.  Pt unsure where fever is coming from. Had chemo 10 days ago. Denies headache. No neck pain or stiffness. No cough or uri symptoms. No abd pain or nvd. No dysuria or gu c/o. No rash/skin lesions. +general weakness/general malaise.   The history is provided by the patient.  Fever   Pertinent negatives include no chest pain, no diarrhea, no vomiting, no headaches, no sore throat and no cough.    Past Medical History:  Diagnosis Date  . COPD (chronic obstructive pulmonary disease) ([place])   . Non-small cell lung cancer (NSCLC) ([place])   . Pancreatic cancer ([place]) 07/01/2017   Adenocarcinoma  . Post-radiation pneumonitis [facility])     Patient Active Problem List   Diagnosis Date Noted  . Goals of care, counseling/discussion 12/23/2017  . Primary cancer of head of pancreas ([place]) 07/13/2017  . Obstructive jaundice due to malignant neoplasm ([place]) 06/25/2017  . Cough 03/26/2016  . Dyspnea 01/07/2016  . Post-radiation pneumonitis ([place]) 10/29/2015  . Non-small cell cancer of left lung ([place]) 10/23/2015  . COPD (chronic obstructive pulmonary disease) ([place]) 12/06/2014    Past Surgical History:  Procedure Laterality Date  . APPENDECTOMY    . cataracts     surgery  . CHOLECYSTECTOMY    . REPLACEMENT TOTAL KNEE Left   . TOTAL HIP ARTHROPLASTY Right      OB History   None      Home Medications    Prior to Admission medications   Medication Sig Start Date End Date Taking? Authorizing Provider  cholecalciferol (VITAMIN D) 1000 units tablet Take 1,000 Units by mouth daily.    [provider]    escitalopram (LEXAPRO) 10 MG tablet TK 1 T PO D 10/15/17   [provider]  fluticasone (FLONASE) 50 MCG/ACT nasal spray Place 2 sprays into both nostrils daily. 03/26/16   Collene Gobble, MD  HYDROmorphone (DILAUDID) 2 MG tablet Take 1-2 tablets (2-4 mg total) by mouth every 6 (six) hours as needed for severe pain. 01/06/18   Owens Shark, NP  mirtazapine (REMERON) 15 MG tablet TK 1 T PO HS FOR SLEEP AND APPETITE 12/06/17   [provider]  Multiple Vitamins-Minerals (OPTIVITE PO) Take 1 capsule by mouth daily.    [provider]  omeprazole (PRILOSEC OTC) 20 MG tablet Take 20 mg by mouth daily.    [provider]  ondansetron (ZOFRAN) 8 MG tablet TAKE 1 TABLET(8 MG) BY MOUTH EVERY 8 HOURS AS NEEDED FOR NAUSEA OR VOMITING 12/24/17   Owens Shark, NP  pantoprazole (PROTONIX) 40 MG tablet TAKE 1 TABLET(40 MG) BY MOUTH DAILY 12/23/17   Ladell Pier, MD  polyethylene glycol powder (MIRALAX) powder Take 17 g by mouth daily. 11/03/17   Palumbo, April, MD  prednisoLONE acetate (PRED FORTE) 1 % ophthalmic suspension INT 1 GTT IN OD D START ON mon. wed and friday 04/14/17   [provider]  PROAIR HFA 108 (90 Base) MCG/ACT inhaler INHALE 1 PUFF INTO LUNGS EVERY 4 HOURS AS NEEDED Patient taking differently: INHALE 1 PUFF INTO LUNGS EVERY 4 HOURS AS  NEEDED FOR SOB AND WHEEZING 04/14/17   Collene Gobble, MD  temazepam (RESTORIL) 15 MG capsule Take 1 capsule (15 mg total) by mouth at bedtime as needed for sleep. 08/16/17   Hayden Pedro, PA-C  temazepam (RESTORIL) 7.5 MG capsule TK ONE C PO PRF SLP 10/27/17   [provider]  Tiotropium Bromide-Olodaterol (STIOLTO RESPIMAT) 2.5-2.5 MCG/ACT AERS Inhale 2 puffs into the lungs daily. 09/20/17   Collene Gobble, MD  vitamin C (ASCORBIC ACID) 500 MG tablet Take 500 mg by mouth daily.    [provider]  vitamin E 400 UNIT capsule Take 400 Units by mouth daily.    [provider]     Family History Family History  Problem Relation Age of Onset  . Dementia Mother   . Hypertension Mother   . Deep vein thrombosis Father     Social History Social History   Tobacco Use  . Smoking status: Former Smoker    Packs/day: 0.75    Years: 60.00    Pack years: 45.00    Types: Cigarettes    Last attempt to quit: 08/25/2011    Years since quitting: 6.3  . Smokeless tobacco: Never Used  Substance Use Topics  . Alcohol use: Yes    Comment: occ  . Drug use: No     Allergies   Patient has no known allergies.   Review of Systems Review of Systems  Constitutional: Positive for chills and fever.  HENT: Negative for sore throat.   Eyes: Negative for redness and visual disturbance.  Respiratory: Negative for cough and shortness of breath.   Cardiovascular: Negative for chest pain.  Gastrointestinal: Negative for abdominal pain, diarrhea and vomiting.  Genitourinary: Negative for dysuria and flank pain.  Musculoskeletal: Negative for back pain and neck pain.  Skin: Negative for rash.  Neurological: Positive for weakness. Negative for headaches.  Hematological: Does not bruise/bleed easily.  Psychiatric/Behavioral: Negative for confusion.     Physical Exam Updated Vital Signs Wt 64.9 kg (143 lb)   BMI 26.16 kg/m   Physical Exam  Constitutional: She appears well-developed and well-nourished. No distress.  HENT:  Head: Atraumatic.  Mouth/Throat: Oropharynx is clear and moist.  Eyes: Conjunctivae are normal. No scleral icterus.  Neck: Neck supple. No tracheal deviation present. No thyromegaly present.  No stiffness or rigidity  Cardiovascular: Normal rate, regular rhythm, normal heart sounds and intact distal pulses. Exam reveals no gallop and no friction rub.  No murmur heard. Pulmonary/Chest: Effort normal and breath sounds normal. No respiratory distress.  Abdominal: Soft. Normal appearance and bowel sounds are normal. She exhibits no distension. There is  no tenderness.  Genitourinary:  Genitourinary Comments: No cva tenderness  Musculoskeletal: She exhibits no edema.  No focal pain or bony tenderness.   Neurological: She is alert.  Speech normal. Motor intact bil, stre 5/5. sens grossly intact.   Skin: Skin is warm and dry. No rash noted. She is not diaphoretic.  Psychiatric: She has a normal mood and affect.  Nursing note and vitals reviewed.    ED Treatments / Results  Labs (all labs ordered are listed, but only abnormal results are displayed) Results for orders placed or performed during the hospital encounter of 01/08/18  Comprehensive metabolic panel  Result Value Ref Range   Sodium 131 (L) 135 - 145 mmol/L   Potassium 3.8 3.5 - 5.1 mmol/L   Chloride 95 (L) 101 - 111 mmol/L   CO2 23 22 - 32 mmol/L  Glucose, Bld 120 (H) 65 - 99 mg/dL   BUN 9 6 - 20 mg/dL   Creatinine, Ser 0.62 0.44 - 1.00 mg/dL   Calcium 9.5 8.9 - 10.3 mg/dL   Total Protein 6.4 (L) 6.5 - 8.1 g/dL   Albumin 2.8 (L) 3.5 - 5.0 g/dL   AST 73 (H) 15 - 41 U/L   ALT 52 14 - 54 U/L   Alkaline Phosphatase 1,282 (H) 38 - 126 U/L   Total Bilirubin 2.2 (H) 0.3 - 1.2 mg/dL   GFR calc non Af Amer >60 >60 mL/min   GFR calc Af Amer >60 >60 mL/min   Anion gap 13 5 - 15  CBC WITH DIFFERENTIAL  Result Value Ref Range   WBC 5.7 4.0 - 10.5 K/uL   RBC 3.12 (L) 3.87 - 5.11 MIL/uL   Hemoglobin 9.3 (L) 12.0 - 15.0 g/dL   HCT 27.3 (L) 36.0 - 46.0 %   MCV 87.5 78.0 - 100.0 fL   MCH 29.8 26.0 - 34.0 pg   MCHC 34.1 30.0 - 36.0 g/dL   RDW 15.3 11.5 - 15.5 %   Platelets 168 150 - 400 K/uL   Neutrophils Relative % 86 %   Neutro Abs 4.9 1.7 - 7.7 K/uL   Lymphocytes Relative 7 %   Lymphs Abs 0.4 (L) 0.7 - 4.0 K/uL   Monocytes Relative 7 %   Monocytes Absolute 0.4 0.1 - 1.0 K/uL   Eosinophils Relative 0 %   Eosinophils Absolute 0.0 0.0 - 0.7 K/uL   Basophils Relative 0 %   Basophils Absolute 0.0 0.0 - 0.1 K/uL  Urinalysis, Routine w reflex microscopic  Result Value Ref  Range   Color, Urine AMBER (A) YELLOW   APPearance CLEAR CLEAR   Specific Gravity, Urine 1.011 1.005 - 1.030   pH 7.0 5.0 - 8.0   Glucose, UA NEGATIVE NEGATIVE mg/dL   Hgb urine dipstick NEGATIVE NEGATIVE   Bilirubin Urine NEGATIVE NEGATIVE   Ketones, ur NEGATIVE NEGATIVE mg/dL   Protein, ur NEGATIVE NEGATIVE mg/dL   Nitrite NEGATIVE NEGATIVE   Leukocytes, UA NEGATIVE NEGATIVE  I-Stat CG4 Lactic Acid, ED  (not at  [facility])  Result Value Ref Range   Lactic Acid, Venous 1.72 0.5 - 1.9 mmol/L  I-Stat CG4 Lactic Acid, ED  (not at  [facility])  Result Value Ref Range   Lactic Acid, Venous 1.56 0.5 - 1.9 mmol/L   Dg Chest Port 1 View  Result Date: 01/08/2018 CLINICAL DATA:  Fever.  Pancreatic cancer.  History of lung cancer. EXAM: PORTABLE CHEST 1 VIEW COMPARISON:  CT 12/02/2017 FINDINGS: Stable density in the left mid lung, likely postradiation/treatment changes. Left lower lobe atelectasis or infiltrate. Suspect small left effusion. The innumerable previously seen small subcentimeter pulmonary nodules are again noted compatible with widespread metastases. Mild cardiomegaly. Degenerative changes in the shoulders. IMPRESSION: Numerous tiny scattered pulmonary nodules as seen on prior chest CT compatible with metastases. Postradiation changes in the left mid lung. Left lower lobe atelectasis or infiltrate with small left effusion. Electronically Signed   By: Rolm Baptise M.D.   On: 01/08/2018 18:06    EKG None  Radiology Dg Chest Port 1 View  Result Date: 01/08/2018 CLINICAL DATA:  Fever.  Pancreatic cancer.  History of lung cancer. EXAM: PORTABLE CHEST 1 VIEW COMPARISON:  CT 12/02/2017 FINDINGS: Stable density in the left mid lung, likely postradiation/treatment changes. Left lower lobe atelectasis or infiltrate. Suspect small left effusion. The innumerable previously seen small subcentimeter pulmonary  nodules are again noted compatible with widespread metastases. Mild cardiomegaly. Degenerative  changes in the shoulders. IMPRESSION: Numerous tiny scattered pulmonary nodules as seen on prior chest CT compatible with metastases. Postradiation changes in the left mid lung. Left lower lobe atelectasis or infiltrate with small left effusion. Electronically Signed   By: Rolm Baptise M.D.   On: 01/08/2018 18:06    Procedures Procedures (including critical care time)  Medications Ordered in ED Medications - No data to display   Initial Impression / Assessment and Plan / ED Course  I have reviewed the triage vital signs and the nursing notes.  Pertinent labs & imaging results that were available during my care of the patient were reviewed by me and considered in my medical decision making (see chart for details).  Iv ns. Labs.   Reviewed nursing notes and prior charts for additional history.   Cultures sent.   xrays reviewed - pulm mets, ?lower lobe infiltrate. Cultures sent. Room air pulse ox 92% - will treat for possible cap. Rocephin and zithromax iv.  Labs reviewed - lactate normal.  Pt is generally weak, +fever/chills, lives alone. Family feels unable to care for self given weakness and fever.  Also from labs - alk phos markedly increased from prior. No abd pain or ruq tenderness. Hx cholecystectomy in past.   hospitalists consulted for admission. Feel pt could also benefit from palliative medicine and/or hospice medicine consult during admission.   Final Clinical Impressions(s) / ED Diagnoses   Final diagnoses:  None    ED Discharge Orders    None       Lajean Saver, MD 01/08/18 2056

## 2018-01-08 NOTE — ED Notes (Signed)
Bed: VF47 Expected date:  Expected time:  Means of arrival:  Comments: EMS 84y/o Female w/ Panc. Cancer, febrile, weakness

## 2018-01-08 NOTE — ED Triage Notes (Signed)
Pt arrives via EMS from home. Pt had first chemo treatment 10 days ago for pancreatic cancer. Pt had lung cancer 3 years ago, remission then the pancreatic Ca was found. Per EMS: pt was told there was nothing more that could be done and pt demanded chemo treatment. Pt reports she developed chills and a fever today. Pt denies any sick contacts. Pt reports taking Tylenol in the last 4 hrs.

## 2018-01-08 NOTE — ED Notes (Signed)
Pt informed a urine sample is needed. Pt stated they were not able to provide a urine sample.

## 2018-01-09 ENCOUNTER — Inpatient Hospital Stay (HOSPITAL_COMMUNITY): Payer: PPO

## 2018-01-09 ENCOUNTER — Other Ambulatory Visit: Payer: Self-pay | Admitting: Oncology

## 2018-01-09 DIAGNOSIS — C250 Malignant neoplasm of head of pancreas: Secondary | ICD-10-CM

## 2018-01-09 DIAGNOSIS — Z7189 Other specified counseling: Secondary | ICD-10-CM

## 2018-01-09 DIAGNOSIS — L89151 Pressure ulcer of sacral region, stage 1: Secondary | ICD-10-CM

## 2018-01-09 DIAGNOSIS — C259 Malignant neoplasm of pancreas, unspecified: Secondary | ICD-10-CM

## 2018-01-09 DIAGNOSIS — J181 Lobar pneumonia, unspecified organism: Principal | ICD-10-CM

## 2018-01-09 DIAGNOSIS — R748 Abnormal levels of other serum enzymes: Secondary | ICD-10-CM

## 2018-01-09 DIAGNOSIS — C799 Secondary malignant neoplasm of unspecified site: Secondary | ICD-10-CM

## 2018-01-09 DIAGNOSIS — E876 Hypokalemia: Secondary | ICD-10-CM

## 2018-01-09 DIAGNOSIS — R509 Fever, unspecified: Secondary | ICD-10-CM

## 2018-01-09 DIAGNOSIS — Z515 Encounter for palliative care: Secondary | ICD-10-CM

## 2018-01-09 DIAGNOSIS — J449 Chronic obstructive pulmonary disease, unspecified: Secondary | ICD-10-CM

## 2018-01-09 LAB — COMPREHENSIVE METABOLIC PANEL
ALT: 43 U/L (ref 14–54)
AST: 51 U/L — ABNORMAL HIGH (ref 15–41)
Albumin: 2.5 g/dL — ABNORMAL LOW (ref 3.5–5.0)
Alkaline Phosphatase: 1048 U/L — ABNORMAL HIGH (ref 38–126)
Anion gap: 10 (ref 5–15)
BUN: 9 mg/dL (ref 6–20)
CALCIUM: 9.4 mg/dL (ref 8.9–10.3)
CHLORIDE: 100 mmol/L — AB (ref 101–111)
CO2: 25 mmol/L (ref 22–32)
CREATININE: 0.67 mg/dL (ref 0.44–1.00)
GFR calc Af Amer: >60 mL/min (ref >60)
GFR calc non Af Amer: >60 mL/min (ref >60)
Glucose, Bld: 103 mg/dL — ABNORMAL HIGH (ref 65–99)
Potassium: 3.2 mmol/L — ABNORMAL LOW (ref 3.5–5.1)
Sodium: 135 mmol/L (ref 135–145)
Total Bilirubin: 1.6 mg/dL — ABNORMAL HIGH (ref 0.3–1.2)
Total Protein: 5.7 g/dL — ABNORMAL LOW (ref 6.5–8.1)

## 2018-01-09 LAB — CBC
HCT: 24.8 % — ABNORMAL LOW (ref 36.0–46.0)
Hemoglobin: 8.1 g/dL — ABNORMAL LOW (ref 12.0–15.0)
MCH: 29.2 pg (ref 26.0–34.0)
MCHC: 32.7 g/dL (ref 30.0–36.0)
MCV: 89.5 fL (ref 78.0–100.0)
PLATELETS: 160 10*3/uL (ref 150–400)
RBC: 2.77 MIL/uL — AB (ref 3.87–5.11)
RDW: 15.3 % (ref 11.5–15.5)
WBC: 4.1 10*3/uL (ref 4.0–10.5)

## 2018-01-09 LAB — IRON AND TIBC
IRON: 34 ug/dL (ref 28–170)
Saturation Ratios: 17 % (ref 10.4–31.8)
TIBC: 196 ug/dL — ABNORMAL LOW (ref 250–450)
UIBC: 162 ug/dL

## 2018-01-09 LAB — MAGNESIUM: Magnesium: 1.6 mg/dL — ABNORMAL LOW (ref 1.7–2.4)

## 2018-01-09 LAB — VITAMIN B12: Vitamin B-12: 1219 pg/mL — ABNORMAL HIGH (ref 180–914)

## 2018-01-09 LAB — FERRITIN: FERRITIN: 552 ng/mL — AB (ref 11–307)

## 2018-01-09 LAB — STREP PNEUMONIAE URINARY ANTIGEN: STREP PNEUMO URINARY ANTIGEN: POSITIVE — AB

## 2018-01-09 LAB — FOLATE: Folate: 20 ng/mL (ref >5.9)

## 2018-01-09 MED ORDER — IPRATROPIUM-ALBUTEROL 0.5-2.5 (3) MG/3ML IN SOLN
3.0000 mL | Freq: Four times a day (QID) | RESPIRATORY_TRACT | Status: DC
Start: 2018-01-09 — End: 2018-01-09
  Administered 2018-01-09: 3 mL via RESPIRATORY_TRACT
  Filled 2018-01-09: qty 3

## 2018-01-09 MED ORDER — IPRATROPIUM-ALBUTEROL 0.5-2.5 (3) MG/3ML IN SOLN: 3.0000 mL | Freq: Two times a day (BID) | RESPIRATORY_TRACT | Status: DC

## 2018-01-09 MED ORDER — MAGNESIUM SULFATE 4 GM/100ML IV SOLN
4.0000 g | Freq: Once | INTRAVENOUS | Status: AC
  Administered 2018-01-09: 4 g via INTRAVENOUS
  Filled 2018-01-09: qty 100

## 2018-01-09 MED ORDER — BUDESONIDE 0.5 MG/2ML IN SUSP
0.5000 mg | Freq: Two times a day (BID) | RESPIRATORY_TRACT | Status: DC
  Administered 2018-01-09 – 2018-01-10 (×3): 0.5 mg via RESPIRATORY_TRACT
  Filled 2018-01-09 (×3): qty 2

## 2018-01-09 MED ORDER — POTASSIUM CHLORIDE CRYS ER 20 MEQ PO TBCR
40.0000 meq | EXTENDED_RELEASE_TABLET | ORAL | Status: AC
  Administered 2018-01-09 (×2): 40 meq via ORAL
  Filled 2018-01-09 (×2): qty 2

## 2018-01-09 MED ORDER — IPRATROPIUM-ALBUTEROL 0.5-2.5 (3) MG/3ML IN SOLN: 3.0000 mL | RESPIRATORY_TRACT | Status: DC | PRN

## 2018-01-09 NOTE — Progress Notes (Signed)
Pt 's family very upset that the pt will not discuss the fact that she has limited time left. Pt states she wants to continue chemotherapy

## 2018-01-09 NOTE — Progress Notes (Signed)
PROGRESS NOTE    Carol Cook  DPO:[id number] DOB: 06/26/1927 DOA: 01/08/2018 PCP: Velna Hatchet, MD    Brief Narrative:  Carol Cook is a 84 y.o. female with medical history significant of metastatic pancreatic cancer Dx in Nov.  Did first round of chemo x2 weeks ago.  Patient presented to the ED with c/o fever up to 101 at home.  No headache, she stated no cough (family says she has mild cough), she stated no abd pain currently (family states she frequently has severe abd pain ongoing due to cancer).  No rash, no dysuria.   ED Course: Possible LLL PNA on CXR.  Alk phos up to 1280.  T bili 2.2, AST 73, ALT 52.  Lactate neg x2.  Started on rocephin / azithro for presumed CAP.  UA neg.     Assessment & Plan:   Principal Problem:   CAP (community acquired pneumonia) Active Problems:   COPD (chronic obstructive pulmonary disease) ([place])   Primary cancer of head of pancreas ([place])   Hypokalemia   Hypomagnesemia   Pressure injury of coccygeal region, stage 1  1 left lower lobe community-acquired pneumonia Patient on recent chemo treatment.  Patient presented with fever and cough.  Chest x-ray concerning for left lower lobe pneumonia.  Check blood cultures.  Urine strep pneumococcus antigen is positive.  Patient with some clinical improvement.  Discontinue azithromycin.  Continue IV Rocephin.  Continue Brovana, Pulmicort, Flonase.  Placed on scheduled nebs.  Follow.  2.  COPD Stable.  Continue scheduled nebulizers, Brovana, Pulmicort, Flonase.  3.  Hypokalemia/hypomagnesemia Replete.  4.  Stage I pressure injury of the coccyx Dressing changes.  5.  Adenocarcinoma of the pancreas head/uncinate status post EUS biopsy at [facility] 02/28/2017 We will inform oncology of patient's admission via epic.  Family per admitting physician requesting palliative care consult for goals of care.  Palliative care has been consulted.  Follow.  6.  Elevated alk phosphatase Right upper quadrant  ultrasound done.   DVT prophylaxis: Lovenox Code Status: DNR Family Communication: Updated Family at bedside. Disposition Plan: To be determined.   Consultants:   Palliative Care : Dr Rowe Pavy 01/09/2018  Oncology pending.  Procedures:   Chest x-ray 01/08/2018  RUQ [place] 01/09/2018  Antimicrobials:   IV Rocephin 01/08/2018>>>>> 01/16/2018  IV azithromycin 01/08/2018>>>>> 01/09/2018   Subjective: Patient sitting up in bed about to eat her lunch.  Denies any chest pain or shortness of breath.  Denies any abdominal pain.  No nausea or vomiting.  Denies any cough.  Objective: Vitals:   01/08/18 2130 01/08/18 2259 01/09/18 0529 01/09/18 0801  BP: 116/68 126/71 139/70   Pulse: 79 80 69 65  Resp: (!) _0 Temp:  98.4 F (36.9 C) 98.1 F (36.7 C)   TempSrc:  Oral Oral   SpO2: 91% 100% 96% 92%  Weight:        Intake/Output Summary (Last 24 hours) at 01/09/2018 1219 Last data filed at 01/08/2018 2221 Gross per 24 hour  Intake 700 ml  Output 250 ml  Net 450 ml   Filed Weights   01/08/18 1730  Weight: 64.9 kg (143 lb)    Examination:  General exam: Appears calm and comfortable  Respiratory system: Some decreased breath sounds in the left base.  Some coarse breath sounds left base.  No wheezing.  No crackles.  Normal respiratory effort. Cardiovascular system: S1 & S2 heard, RRR. No JVD, murmurs, rubs, gallops or clicks. No pedal edema. Gastrointestinal  system: Abdomen is nondistended, soft and nontender. No organomegaly or masses felt. Normal bowel sounds heard. Central nervous system: Alert and oriented. No focal neurological deficits. Extremities: Symmetric 5 x 5 power. Skin: No rashes, lesions or ulcers Psychiatry: Judgement and insight appear normal. Mood & affect appropriate.     Data Reviewed: I have personally reviewed following labs and imaging studies  CBC: Recent Labs  Lab 01/08/18 1738 01/09/18 0356  WBC 5.7 4.1  NEUTROABS 4.9  --   HGB 9.3* 8.1*    HCT 27.3* 24.8*  MCV 87.5 89.5  PLT 168 536   Basic Metabolic Panel: Recent Labs  Lab 01/08/18 1738 01/09/18 0356 01/09/18 0747  NA 131* 135  --   K 3.8 3.2*  --   CL 95* 100*  --   CO2 23 25  --   GLUCOSE 120* 103*  --   BUN 9 9  --   CREATININE 0.62 0.67  --   CALCIUM 9.5 9.4  --   MG  --   --  1.6*   GFR: Estimated Creatinine Clearance: 42.1 mL/min (by C-G formula based on SCr of 0.67 mg/dL). Liver Function Tests: Recent Labs  Lab 01/08/18 1738 01/09/18 0356  AST 73* 51*  ALT 52 43  ALKPHOS 1,282* 1,048*  BILITOT 2.2* 1.6*  PROT 6.4* 5.7*  ALBUMIN 2.8* 2.5*   No results for input(s): LIPASE, AMYLASE in the last 168 hours. No results for input(s): AMMONIA in the last 168 hours. Coagulation Profile: No results for input(s): INR, PROTIME in the last 168 hours. Cardiac Enzymes: No results for input(s): CKTOTAL, CKMB, CKMBINDEX, TROPONINI in the last 168 hours. BNP (last 3 results) No results for input(s): PROBNP in the last 8760 hours. HbA1C: No results for input(s): HGBA1C in the last 72 hours. CBG: No results for input(s): GLUCAP in the last 168 hours. Lipid Profile: No results for input(s): CHOL, HDL, LDLCALC, TRIG, CHOLHDL, LDLDIRECT in the last 72 hours. Thyroid Function Tests: No results for input(s): TSH, T4TOTAL, FREET4, T3FREE, THYROIDAB in the last 72 hours. Anemia Panel: No results for input(s): VITAMINB12, FOLATE, FERRITIN, TIBC, IRON, RETICCTPCT in the last 72 hours. Sepsis Labs: Recent Labs  Lab 01/08/18 1850 01/08/18 2006  LATICACIDVEN 1.72 1.56    Recent Results (from the past 240 hour(s))  Blood Culture (routine x 2)     Status: None (Preliminary result)   Collection Time: 01/08/18  5:38 PM  Result Value Ref Range Status   Specimen Description   Final    BLOOD RIGHT ANTECUBITAL Performed at [facility], [place] [address] [place], [place] [address]    Special Requests   Final    BOTTLES DRAWN AEROBIC AND ANAEROBIC Blood  Culture adequate volume Performed at [facility] [address], [place], [place] [address]    Culture PENDING  Incomplete   Report Status PENDING  Incomplete  Blood Culture (routine x 2)     Status: None (Preliminary result)   Collection Time: 01/08/18  5:43 PM  Result Value Ref Range Status   Specimen Description   Final    BLOOD LEFT ANTECUBITAL Performed at [facility], [place] [address], [place], [place] [address]    Special Requests   Final    BOTTLES DRAWN AEROBIC AND ANAEROBIC Blood Culture adequate volume Performed at [facility] [address], [place], [place] [address]    Culture PENDING  Incomplete   Report Status PENDING  Incomplete  Radiology Studies: Dg Chest Port 1 View  Result Date: 01/08/2018 CLINICAL DATA:  Fever.  Pancreatic cancer.  History of lung cancer. EXAM: PORTABLE CHEST 1 VIEW COMPARISON:  CT 12/02/2017 FINDINGS: Stable density in the left mid lung, likely postradiation/treatment changes. Left lower lobe atelectasis or infiltrate. Suspect small left effusion. The innumerable previously seen small subcentimeter pulmonary nodules are again noted compatible with widespread metastases. Mild cardiomegaly. Degenerative changes in the shoulders. IMPRESSION: Numerous tiny scattered pulmonary nodules as seen on prior chest CT compatible with metastases. Postradiation changes in the left mid lung. Left lower lobe atelectasis or infiltrate with small left effusion. Electronically Signed   By: Rolm Baptise M.D.   On: 01/08/2018 18:06   US Abdomen Limited Ruq  Result Date: 01/09/2018 CLINICAL DATA:  Elevated liver function tests EXAM: ULTRASOUND ABDOMEN LIMITED RIGHT UPPER QUADRANT COMPARISON:  CT 12/02/2017 FINDINGS: Gallbladder: Surgically absent Common bile duct: Diameter: Normal at 4 mm. Liver: Biliary duct dilatation is noted. Pneumobilia also noted. Findings similar to comparison CT. Portal vein is patent on  color Doppler imaging with normal direction of blood flow towards the liver. IMPRESSION: 1. Intrahepatic biliary duct dilatation and pneumobilia are noted. Findings are similar to and potentially increased from CT 12/02/2017. 2. Common bile duct measures normal caliber. Electronically Signed   By: Suzy Bouchard M.D.   On: 01/09/2018 10:41        Scheduled Meds: . arformoterol  15 mcg Nebulization BID  . budesonide (PULMICORT) nebulizer solution  0.5 mg Nebulization BID  . cholecalciferol  1,000 Units Oral Daily  . enoxaparin (LOVENOX) injection  40 mg Subcutaneous Daily  . escitalopram  10 mg Oral Daily  . fluticasone  2 spray Each Nare Daily  . ipratropium-albuterol  3 mL Nebulization BID  . pantoprazole  40 mg Oral Daily  . polyethylene glycol  17 g Oral Daily  . potassium chloride  40 mEq Oral Q4H   Continuous Infusions: . cefTRIAXone (ROCEPHIN)  IV    . magnesium sulfate 1 - 4 g bolus IVPB       LOS: 1 day    Time spent: 35 minutes    Irine Seal, MD [facility] Pager [phone]  If 7PM-7AM, please contact night-coverage [email or web address] Password [email or web address] 01/09/2018, 12:19 PM

## 2018-01-09 NOTE — Consult Note (Signed)
Consultation Note Date: 01/09/2018   Patient Name: Carol Cook  DOB: September 15, 1926  MRN: [id number]  Age / Sex: 84 y.o., female  PCP: Velna Hatchet, MD Referring Physician: Eugenie Filler, MD  Reason for Consultation: Establishing goals of care  HPI/Patient Profile: 84 y.o. female  admitted on 01/08/2018   Life limiting illness: metastatic pancreatic cancer diagnosed in 2018.   Clinical Assessment and Goals of Care: Adenocarcinoma of the pancreas was diagnosed in July 2018. The patient also has history of non small cell lung cancer, also s/o biliary stent placement in November 2018.   Patient presented with fever and chills, she did her first round of chemotherapy 2 weeks ago. She states that she was due to start her 2nd treatment on 01-11-18. She wants to ask Dr Benay Spice if she will still be a candidate to continue treatments.   More recently, she was diagnosed to have disease progression with an enlarging pancreas mass and new lung metastases.  Patient has been admitted with community acquired PNA,s he has been placed on appropriate antibiotics, cultures pending.   A PMT consult has been requested for goals of care discussions.   The patient is awake alert resting in bed, she has some what flat affect, avoids extensive discussions. I introduced myself and palliative care as follows:  Palliative medicine is specialized medical care for people living with serious illness. It focuses on providing relief from the symptoms and stress of a serious illness. The goal is to improve quality of life for both the patient and the family.  Patient is asking for something to eat/drink. She also complains of having chills. We did very briefly have discussions that the patient has a serious irreversible condition. Patient at present, doesn't wish to discuss further.   See additional discussions/recommendations  below. Thank you for the consult.   NEXT OF KIN  son Herbie [place] and daughter Pamala Hurry. No family at bedside currently.   DISCUSSIONS/SUMMARY OF RECOMMENDATIONS    Agree with DNR Continue current mode of care for now Med onc consult, Dr Gearldine Shown input has been requested, patient wishes to re discuss with him about her upcoming chemotherapy appointment. While patient states she is aware of how seriously ill she is, she doesn't wish to discuss further today, she complains of feeling hungry and having chills. Will contact family on 01-10-18. Thank you for the consult.  Code Status/Advance Care Planning:  DNR    Symptom Management:    as above   Palliative Prophylaxis:   Bowel Regimen  Psycho-social/Spiritual:   Desire for further Chaplaincy support:yes  Additional Recommendations: Caregiving  Support/Resources  Prognosis:   Unable to determine  Discharge Planning: To Be Determined      Primary Diagnoses: Present on Admission: . Primary cancer of head of pancreas ([place]) . CAP (community acquired pneumonia) . COPD (chronic obstructive pulmonary disease) ([place]) . Hypomagnesemia . Pressure injury of coccygeal region, stage 1   I have reviewed the medical record, interviewed the patient and family, and examined the patient. The following aspects  are pertinent.  Past Medical History:  Diagnosis Date  . COPD (chronic obstructive pulmonary disease) ([place])   . Non-small cell lung cancer (NSCLC) ([place])   . Pancreatic cancer ([place]) 07/01/2017   Adenocarcinoma  . Post-radiation pneumonitis ([facility])    Social History   Socioeconomic History  . Marital status: Widowed    Spouse name: Not on file  . Number of children: Not on file  . Years of education: Not on file  . Highest education level: Not on file  Occupational History  . Not on file  Social Needs  . Financial resource strain: Not on file  . Food insecurity:    Worry: Not on file    Inability: Not on file  .  Transportation needs:    Medical: Not on file    Non-medical: Not on file  Tobacco Use  . Smoking status: Former Smoker    Packs/day: 0.75    Years: 60.00    Pack years: 45.00    Types: Cigarettes    Last attempt to quit: 08/25/2011    Years since quitting: 6.3  . Smokeless tobacco: Never Used  Substance and Sexual Activity  . Alcohol use: Yes    Comment: occ  . Drug use: No  . Sexual activity: Not on file  Lifestyle  . Physical activity:    Days per week: Not on file    Minutes per session: Not on file  . Stress: Not on file  Relationships  . Social connections:    Talks on phone: Not on file    Gets together: Not on file    Attends religious service: Not on file    Active member of club or organization: Not on file    Attends meetings of clubs or organizations: Not on file    Relationship status: Not on file  Other Topics Concern  . Not on file  Social History Narrative  . Not on file   Family History  Problem Relation Age of Onset  . Dementia Mother   . Hypertension Mother   . Deep vein thrombosis Father    Scheduled Meds: . arformoterol  15 mcg Nebulization BID  . budesonide (PULMICORT) nebulizer solution  0.5 mg Nebulization BID  . cholecalciferol  1,000 Units Oral Daily  . enoxaparin (LOVENOX) injection  40 mg Subcutaneous Daily  . escitalopram  10 mg Oral Daily  . fluticasone  2 spray Each Nare Daily  . ipratropium-albuterol  3 mL Nebulization BID  . pantoprazole  40 mg Oral Daily  . polyethylene glycol  17 g Oral Daily   Continuous Infusions: . cefTRIAXone (ROCEPHIN)  IV    . magnesium sulfate 1 - 4 g bolus IVPB 4 g (01/09/18 1350)   PRN Meds:.acetaminophen **OR** acetaminophen, HYDROmorphone, ipratropium-albuterol, ondansetron **OR** ondansetron (ZOFRAN) IV, temazepam Medications Prior to Admission:  Prior to Admission medications   Medication Sig Start Date End Date Taking? Authorizing Provider  Carboxymethylcellulose Sodium (EYE DROPS OP) Place 1  drop into both eyes daily as needed (moisten eyes).   Yes [provider]  cholecalciferol (VITAMIN D) 1000 units tablet Take 1,000 Units by mouth daily.   Yes [provider]  escitalopram (LEXAPRO) 10 MG tablet TK 1 T PO D 10/15/17  Yes [provider]  HYDROmorphone (DILAUDID) 2 MG tablet Take 1-2 tablets (2-4 mg total) by mouth every 6 (six) hours as needed for severe pain. 01/06/18  Yes Owens Shark, NP  mirtazapine (REMERON) 15 MG tablet TK 1 T  PO HS FOR SLEEP AND APPETITE 12/06/17  Yes [provider]  Multiple Vitamins-Minerals (OPTIVITE PO) Take 1 capsule by mouth daily.   Yes [provider]  omeprazole (PRILOSEC OTC) 20 MG tablet Take 20 mg by mouth daily.   Yes [provider]  pantoprazole (PROTONIX) 40 MG tablet TAKE 1 TABLET(40 MG) BY MOUTH DAILY 12/23/17  Yes Ladell Pier, MD  polyethylene glycol powder (MIRALAX) powder Take 17 g by mouth daily. 11/03/17  Yes Palumbo, April, MD  PROAIR HFA 108 (90 Base) MCG/ACT inhaler INHALE 1 PUFF INTO LUNGS EVERY 4 HOURS AS NEEDED 04/14/17  Yes Byrum, Rose Fillers, MD  temazepam (RESTORIL) 15 MG capsule Take 1 capsule (15 mg total) by mouth at bedtime as needed for sleep. 08/16/17  Yes Hayden Pedro, PA-C  Tiotropium Bromide-Olodaterol (STIOLTO RESPIMAT) 2.5-2.5 MCG/ACT AERS Inhale 2 puffs into the lungs daily. 09/20/17  Yes Collene Gobble, MD  vitamin C (ASCORBIC ACID) 500 MG tablet Take 500 mg by mouth daily.   Yes [provider]  vitamin E 400 UNIT capsule Take 400 Units by mouth daily.   Yes [provider]  fluticasone (FLONASE) 50 MCG/ACT nasal spray Place 2 sprays into both nostrils daily. Patient not taking: Reported on 01/08/2018 03/26/16   Collene Gobble, MD  ondansetron (ZOFRAN) 8 MG tablet TAKE 1 TABLET(8 MG) BY MOUTH EVERY 8 HOURS AS NEEDED FOR NAUSEA OR VOMITING 12/24/17   Owens Shark, NP  prednisoLONE acetate (PRED FORTE) 1 % ophthalmic suspension INT 1 GTT  IN OD D START ON mon. wed and friday 04/14/17   [provider]   No Known Allergies Review of Systems +feeling hungry +chillls +weakness  Physical Exam Weak resting in bed Complains of feeling chills [place] regular S1 S2 Abdomen soft No edema Appears pale Non focal Some what flat affect, avoids eye contact and discussions at times.  Vital Signs: BP 123/83 (BP Location: Left Arm)   Pulse 68   Temp 98 F (36.7 C) (Oral)   Resp 16   Wt 64.9 kg (143 lb)   SpO2 92%   BMI 26.16 kg/m  Pain Scale: 0-10   Pain Score: 0-No pain   SpO2: SpO2: 92 % O2 Device:SpO2: 92 % O2 Flow Rate: .   IO: Intake/output summary:   Intake/Output Summary (Last 24 hours) at 01/09/2018 1449 Last data filed at 01/08/2018 2221 Gross per 24 hour  Intake 700 ml  Output 250 ml  Net 450 ml    LBM: Last BM Date: 01/09/18 Baseline Weight: Weight: 64.9 kg (143 lb) Most recent weight: Weight: 64.9 kg (143 lb)     Palliative Assessment/Data:   PPS 40%  Time In:  1400 Time Out:  1500 Time Total:  60 min  Greater than 50%  of this time was spent counseling and coordinating care related to the above assessment and plan.  Signed by: Loistine Chance, MD  [phone]  Please contact Palliative Medicine Team phone at [phone] for questions and concerns.  For individual provider: See Shea Evans

## 2018-01-10 ENCOUNTER — Other Ambulatory Visit: Payer: Self-pay

## 2018-01-10 DIAGNOSIS — C7802 Secondary malignant neoplasm of left lung: Secondary | ICD-10-CM

## 2018-01-10 DIAGNOSIS — Z6826 Body mass index (BMI) 26.0-26.9, adult: Secondary | ICD-10-CM

## 2018-01-10 DIAGNOSIS — D630 Anemia in neoplastic disease: Secondary | ICD-10-CM

## 2018-01-10 DIAGNOSIS — K831 Obstruction of bile duct: Secondary | ICD-10-CM

## 2018-01-10 DIAGNOSIS — R5081 Fever presenting with conditions classified elsewhere: Secondary | ICD-10-CM

## 2018-01-10 DIAGNOSIS — R509 Fever, unspecified: Secondary | ICD-10-CM

## 2018-01-10 DIAGNOSIS — R627 Adult failure to thrive: Secondary | ICD-10-CM

## 2018-01-10 LAB — BASIC METABOLIC PANEL
Anion gap: 9 (ref 5–15)
BUN: 7 mg/dL (ref 6–20)
CALCIUM: 9.1 mg/dL (ref 8.9–10.3)
CO2: 22 mmol/L (ref 22–32)
CREATININE: 0.67 mg/dL (ref 0.44–1.00)
Chloride: 103 mmol/L (ref 101–111)
GFR calc non Af Amer: >60 mL/min (ref >60)
Glucose, Bld: 103 mg/dL — ABNORMAL HIGH (ref 65–99)
Potassium: 4.1 mmol/L (ref 3.5–5.1)
SODIUM: 134 mmol/L — AB (ref 135–145)

## 2018-01-10 LAB — CBC
HCT: 21.8 % — ABNORMAL LOW (ref 36.0–46.0)
Hemoglobin: 7.6 g/dL — ABNORMAL LOW (ref 12.0–15.0)
MCH: 30.9 pg (ref 26.0–34.0)
MCHC: 34.9 g/dL (ref 30.0–36.0)
MCV: 88.6 fL (ref 78.0–100.0)
PLATELETS: 209 10*3/uL (ref 150–400)
RBC: 2.46 MIL/uL — AB (ref 3.87–5.11)
RDW: 15.7 % — AB (ref 11.5–15.5)
WBC: 4.2 10*3/uL (ref 4.0–10.5)

## 2018-01-10 LAB — MAGNESIUM: Magnesium: 2.3 mg/dL (ref 1.7–2.4)

## 2018-01-10 LAB — URINE CULTURE: CULTURE: NO GROWTH

## 2018-01-10 MED ORDER — ACETAMINOPHEN 325 MG PO TABS: 650.0000 mg | ORAL_TABLET | Freq: Four times a day (QID) | ORAL | Status: DC | PRN

## 2018-01-10 MED ORDER — GUAIFENESIN-DM 100-10 MG/5ML PO SYRP: 5.0000 mL | ORAL_SOLUTION | ORAL | 0 refills | Status: DC | PRN

## 2018-01-10 MED ORDER — GUAIFENESIN ER 600 MG PO TB12: 1200.0000 mg | ORAL_TABLET | Freq: Two times a day (BID) | ORAL | 0 refills | Status: AC

## 2018-01-10 MED ORDER — AMOXICILLIN-POT CLAVULANATE 875-125 MG PO TABS: 1.0000 | ORAL_TABLET | Freq: Two times a day (BID) | ORAL | 0 refills | Status: AC

## 2018-01-10 MED ORDER — PANTOPRAZOLE SODIUM 40 MG PO TBEC: DELAYED_RELEASE_TABLET | ORAL | 0 refills | Status: DC

## 2018-01-10 NOTE — Progress Notes (Signed)
IP PROGRESS NOTE  Subjective:   Carol Cook presented to the emergency room on 01/08/2018 with a fever.  A chest x-ray revealed possible left lung pneumonia.  She was placed on antibiotics and admitted for further evaluation. Her chief complaint today is nausea.  Her family is at the bedside.  Objective: Vital signs in last 24 hours: Blood pressure 139/73, pulse 66, temperature 97.6 F (36.4 C), temperature source Oral, resp. rate 20, weight 143 lb (64.9 kg), SpO2 97 %.  Intake/Output from previous day: 05/19 0701 - 05/20 0700 In: 440 [P.O.:340; IV Piggyback:100] Out: -   Physical Exam:  Lungs: Decreased breath sounds at the left lower chest, no respiratory distress Cardiac: Regular rate and rhythm Abdomen: Soft and nontender, no mass, no hepatomegaly Extremities: No leg edema   Portacath/PICC-without erythema  Lab Results: Recent Labs    01/09/18 0356 01/10/18 0411  WBC 4.1 4.2  HGB 8.1* 7.6*  HCT 24.8* 21.8*  PLT 160 209    BMET Recent Labs    01/09/18 0356 01/10/18 0411  NA 135 134*  K 3.2* 4.1  CL 100* 103  CO2 25 22  GLUCOSE 103* 103*  BUN 9 7  CREATININE 0.67 0.67  CALCIUM 9.4 9.1    No results found for: CEA1  Studies/Results: Dg Chest Port 1 View  Result Date: 01/08/2018 CLINICAL DATA:  Fever.  Pancreatic cancer.  History of lung cancer. EXAM: PORTABLE CHEST 1 VIEW COMPARISON:  CT 12/02/2017 FINDINGS: Stable density in the left mid lung, likely postradiation/treatment changes. Left lower lobe atelectasis or infiltrate. Suspect small left effusion. The innumerable previously seen small subcentimeter pulmonary nodules are again noted compatible with widespread metastases. Mild cardiomegaly. Degenerative changes in the shoulders. IMPRESSION: Numerous tiny scattered pulmonary nodules as seen on prior chest CT compatible with metastases. Postradiation changes in the left mid lung. Left lower lobe atelectasis or infiltrate with small left effusion.  Electronically Signed   By: Rolm Baptise M.D.   On: 01/08/2018 18:06   US Abdomen Limited Ruq  Result Date: 01/09/2018 CLINICAL DATA:  Elevated liver function tests EXAM: ULTRASOUND ABDOMEN LIMITED RIGHT UPPER QUADRANT COMPARISON:  CT 12/02/2017 FINDINGS: Gallbladder: Surgically absent Common bile duct: Diameter: Normal at 4 mm. Liver: Biliary duct dilatation is noted. Pneumobilia also noted. Findings similar to comparison CT. Portal vein is patent on color Doppler imaging with normal direction of blood flow towards the liver. IMPRESSION: 1. Intrahepatic biliary duct dilatation and pneumobilia are noted. Findings are similar to and potentially increased from CT 12/02/2017. 2. Common bile duct measures normal caliber. Electronically Signed   By: Suzy Bouchard M.D.   On: 01/09/2018 10:41    Medications: I have reviewed the patient's current medications.  Assessment/Plan:  1. Adenocarcinoma of the pancreas head/uncinate, status post an EUS biopsy at [facility] 02/28/2017 ? CT abdomen/pelvis 06/25/2018-uncinate mass, abutment of the superior mesenteric artery, biliary obstruction ? MRCP 06/26/2017-pancreas head/uncinate mass with abutment of the superior mesenteric artery and abdominal aorta, compression of the splenic vein and SMV, biliary dilatation, small left pleural effusion ? SBRT12/13/2018-08/16/2017 ? CT abdomen/pelvis 10/01/2017-stable pancreas head/uncinate mass, stable peripancreatic lymph nodes, no evidence of progressive metastatic disease ? CTs 12/02/2017- innumerable bilateral pulmonary nodules, increased size of the pancreas head/uncinate mass, new T6 compression fracture ? Cycle 1 gemcitabine 12/28/2017   2. Obstructive jaundice secondary to #1, status post placement of a biliary stent at [facility] 07/01/2017  3.COPD  4.Non-small cell lung cancer, hypermetabolic left upper lobe mass, favor adenocarcinoma, EGFR,ALK ,  and ROS-1negative  SBRT at [facility],  50 Gy in 5 fractions 05/01/2015-05/07/2015  CT chest 10/01/2016-no evidence of recurrent lung cancer  5.Anemia-improved 08/30/2017  6.Postprandial abdominal discomfort. Trial of Protonix initiated 08/30/2017  7.  Admission 01/08/2018 with a fever   Carol Cook has metastatic pancreas cancer.  She was admitted 01/08/2018 with a fever and failure to thrive.  The source of the fever is unclear, but may be related to pneumonia, a biliary infection, or tumor fever.  She is now afebrile and cultures are negative to date.  The markedly elevated alkaline phosphatase and mild hyperbilirubinemia are likely related to a degree of biliary obstruction.  She has an indwelling stent.  She has developed progressive anemia, likely secondary to chemotherapy, chronic disease, and malnutrition.  She does not appear symptomatic from the anemia.  Her overall clinical status has declined over the past several weeks.  I had a long discussion with Carol Cook and her family last week and again today.  She has decided against further chemotherapy.  She and her family are in agreement with hospice care.  She would like to go home with hospice care.  She confirmed a no CODE BLUE status.  Recommendations: 1.  Complete outpatient course of Augmentin 2.  [facility] hospice referral for home care 3.   Ondansetron as needed for nausea 4.  Continue hydrocodone as needed for pain 5.  Outpatient follow-up will be scheduled at the Cancer center   LOS: 2 days   Betsy Coder, MD   01/10/2018, 7:23 AM

## 2018-01-10 NOTE — Progress Notes (Addendum)
[facility]) Hospital Liaison RN visit  Referral Center Notified by Marney Doctor [facility] of patient/family request for [facility] services at home after discharge. Chart and patient information under review by [facility] physician. Hospice eligibility pending at this time.    Writer spoke with patient at bedside and daughter Pamala Hurry via phone to initiate education related to hospice philosophy, services and team approach to care. Patient/family verbalized understanding of information given.  Per discussion, plan is for discharge to home by private vehicle this afternoon (01/10/18).  Please send signed and completed DNR form home with patient/family. Patient will need prescriptions for discharge comfort medications.  DME needs have been discussed, patient currently has the following equipment in the home: a rolling walker and a 3 in 1 walker.  Per daughter, no new equipment needs at this time/refused offer of a hospital bed and bedside commode. Home address has been verified and is correct in the chart.  Contact person is daughter Pamala Hurry [phone]) if patient/family decides to order new equipment before discharge - (Please notify [facility] Liaison who will order equipment as needed).  [facility]erral [facility] aware of the above.  Completed discharge summary will need to be faxed to [facility] at [phone].  Please notify [facility] when patient is ready to leave the unit at discharge.  (Call [phone] or [phone] after 5pm).  [facility] information and contact numbers given to [facility] and patient during this visit.    Please call with any hospice related questions.  Thank you for this referral,  Gar Ponto, [facility] Liaison [phone]     [place] and daughter Pamala Hurry that patient was Hospice eligible.   Per daughter, [facility] Admission nurse visit is scheduled for tomorrow am at the patient's home.

## 2018-01-10 NOTE — Progress Notes (Signed)
Discharge  Instructions explained to patient and her daughter. Prescription given to the daughter and also the DNR form given to the daughter. Patient will be going home with Hospice care. Discharged via wheelchair, daughter will be taking patient home.

## 2018-01-10 NOTE — Care Management Note (Signed)
Case Management Note  Patient Details  Name: Carol Cook MRN: [id number] Date of Birth: 11-28-1926   Pt from home alone and plans to go home with hospice services. Choice offered to pt and [facility] chosen. [facility] referral line called for referral. Pt does think she will need a hospital bed and bedside 3in1, but not prior to her discharging home. Yellow signed DNR placed on front of chart.  Expected Discharge Date:  01/10/18               Expected Discharge Plan:  Home w Hospice Care  In-House Referral:     Discharge planning Services  CM Consult  Post Acute Care Choice:  Hospice Choice offered to:  Patient  DME Arranged:    DME Agency:     HH Arranged:  Disease Management [place] Agency:  [facility] [place]  Status of Service:  In process, will continue to follow  If discussed at Long Length of Stay Meetings, dates discussed:    Additional CommentsLynnell Catalan, RN 01/10/2018, 10:49 AM  [phone]

## 2018-01-10 NOTE — Discharge Summary (Signed)
Physician Discharge Summary  Carol Cook CBJ:[id number] DOB: 1927-06-19 DOA: 01/08/2018  PCP: Carol Hatchet, MD  Admit date: 01/08/2018 Discharge date: 01/10/2018  Time spent: 55 minutes  Recommendations for Outpatient Follow-up:  1. Patient will be discharged home with hospice care. 2. Follow-up with Dr. Benay Cook in 1 to 2 weeks at the cancer center.   Discharge Diagnoses:  Principal Problem:   CAP (community acquired pneumonia) Active Problems:   COPD (chronic obstructive pulmonary disease) (Carol Cook)   Primary cancer of head of pancreas (Carol Cook)   Encounter for palliative care   Hypokalemia   Hypomagnesemia   Pressure injury of coccygeal region, stage 1   Elevated alkaline phosphatase level   Febrile illness   Metastasis from pancreatic cancer (Carol Cook)   Fever   Discharge Condition: Stable and improved  Diet recommendation: Regular  Filed Weights   01/08/18 1730  Weight: 64.9 kg (143 lb)    History of present illness:  Per Dr. Haskel Cook Carol Cook is a 84 y.o. female with medical history significant of metastatic pancreatic cancer Dx in Nov.  Did first round of chemo x2 weeks ago.  Patient presented to the ED with c/o fever up to 101 at home.  No headache, she stated no cough (family says she has mild cough), she stated no abd pain currently (family states she frequently has severe abd pain ongoing due to cancer).  No rash, no dysuria.   ED Course: Possible LLL PNA on CXR.  Alk phos up to 1280.  T bili 2.2, AST 73, ALT 52.  Lactate neg x2.  Started on rocephin / azithro for presumed CAP.  UA neg.    Hospital Course:  1 left lower lobe community-acquired pneumonia Patient on recent chemo treatment.  Patient presented with fever and cough.  Chest x-ray concerning for left lower lobe pneumonia.  Blood cultures were ordered with no growth to date.  Urine strep pneumococcus antigen was positive.  Patient was initially placed on IV Rocephin and azithromycin.  Azithromycin was  subsequently discontinued.  Patient was placed on scheduled nebs.  Patient improved clinically.  Patient remained afebrile throughout the hospitalization.  Patient was seen by oncologist and decision was made not to pursue any further chemotherapy for her pancreatic cancer and patient and family were in agreement with hospice care.  Patient will subsequently be discharged home on 5 more days of oral Augmentin to complete a course of antibiotic treatment.  2.  COPD Stable.    Patient was maintained on scheduled nebs, Brovana, Pulmicort and Flonase.   3.  Hypokalemia/hypomagnesemia Repleted.  4.  Stage I pressure injury of the coccyx Dressing changes.  5.  Adenocarcinoma of the pancreas head/uncinate status post EUS biopsy at [facility] 02/28/2017 Oncology informed of patient's admission.  Patient seen in consultation by oncologist Dr. Benay Cook who discussed with patient and family on patient's prognosis.  It is noted that patient has decided against any further chemotherapy and patient and family are in agreement with hospice care.  Patient will be discharged home with hospice following and will follow-up with oncology in the outpatient setting.  6.  Elevated alk phosphatase Right upper quadrant ultrasound done with intrahepatic biliary duct dilatation and pneumobilia noted.  Findings similar and potentially increased from CT on 12/02/2017.  Common bile duct measures normal caliber.  Patient asymptomatic denied any abdominal pain.  Patient noted to have a indwelling stent.  Patient seen by her oncologist, Dr. Benay Cook and patient has decided against any further chemotherapy after long  discussion and patient and family in agreement with hospice care.  Patient will be discharged home with hospice care.  #  7 fever Patient was admitted with fever, cough chest x-ray concerning for left lower lobe pneumonia.  Patient also noted to have a history of pancreatic cancer status post stent placement and  noted on presentation to have an elevated alk phosphatase and mild hyperbilirubinemia.  Patient however denied any abdominal pain throughout the hospitalization.  Patient was pancultured and results were pending.  Patient was initially placed empirically on IV Rocephin and azithromycin.  Urine strep pneumococcus antigen came back positive.  IV azithromycin was subsequently discontinued.  Patient was maintained on IV Rocephin throughout the hospitalization and will be discharged home on 5 more days of oral Augmentin to complete a one-week course of antibiotic treatment.  Outpatient follow-up with oncology.    Procedures:  Chest x-ray 01/08/2018  RUQ [place] 01/09/2018      Consultations:  Palliative Care : Dr Rowe Pavy 01/09/2018  Oncology.:  Dr. Benay Cook 01/10/2018      Discharge Exam: Vitals:   01/10/18 0639 01/10/18 0843  BP: 139/73   Pulse: 66   Resp: 20   Temp: 97.6 F (36.4 C)   SpO2: 97% 97%    General: NAD Cardiovascular: RRR Respiratory: CTAB  Discharge Instructions   Discharge Instructions    Diet general   Complete by:  As directed    Increase activity slowly   Complete by:  As directed      Allergies as of 01/10/2018   No Known Allergies     Medication List    STOP taking these medications   omeprazole 20 MG tablet Commonly known as:  PRILOSEC OTC     TAKE these medications   acetaminophen 325 MG tablet Commonly known as:  TYLENOL Take 2 tablets (650 mg total) by mouth every 6 (six) hours as needed for mild pain (or Fever >/= 101).   amoxicillin-clavulanate 875-125 MG tablet Commonly known as:  AUGMENTIN Take 1 tablet by mouth 2 (two) times daily for 5 days. Start taking on:  01/11/2018   cholecalciferol 1000 units tablet Commonly known as:  VITAMIN D Take 1,000 Units by mouth daily.   escitalopram 10 MG tablet Commonly known as:  LEXAPRO TK 1 T PO D   EYE DROPS OP Place 1 drop into both eyes daily as needed (moisten eyes).   fluticasone  50 MCG/ACT nasal spray Commonly known as:  FLONASE Place 2 sprays into both nostrils daily.   guaiFENesin 600 MG 12 hr tablet Commonly known as:  MUCINEX Take 2 tablets (1,200 mg total) by mouth 2 (two) times daily for 5 days.   guaiFENesin-dextromethorphan 100-10 MG/5ML syrup Commonly known as:  ROBITUSSIN DM Take 5 mLs by mouth every 4 (four) hours as needed for cough.   HYDROmorphone 2 MG tablet Commonly known as:  DILAUDID Take 1-2 tablets (2-4 mg total) by mouth every 6 (six) hours as needed for severe pain.   mirtazapine 15 MG tablet Commonly known as:  REMERON TK 1 T PO HS FOR SLEEP AND APPETITE   ondansetron 8 MG tablet Commonly known as:  ZOFRAN TAKE 1 TABLET(8 MG) BY MOUTH EVERY 8 HOURS AS NEEDED FOR NAUSEA OR VOMITING   OPTIVITE PO Take 1 capsule by mouth daily.   pantoprazole 40 MG tablet Commonly known as:  PROTONIX TAKE 1 TABLET(40 MG) BY MOUTH DAILY   polyethylene glycol powder powder Commonly known as:  MIRALAX Take 17 g by  mouth daily.   prednisoLONE acetate 1 % ophthalmic suspension Commonly known as:  PRED FORTE INT 1 GTT IN OD D START ON mon. wed and friday   PROAIR HFA 108 (90 Base) MCG/ACT inhaler Generic drug:  albuterol INHALE 1 PUFF INTO LUNGS EVERY 4 HOURS AS NEEDED   temazepam 15 MG capsule Commonly known as:  RESTORIL Take 1 capsule (15 mg total) by mouth at bedtime as needed for sleep.   Tiotropium Bromide-Olodaterol 2.5-2.5 MCG/ACT Aers Commonly known as:  STIOLTO RESPIMAT Inhale 2 puffs into the lungs daily.   vitamin C 500 MG tablet Commonly known as:  ASCORBIC ACID Take 500 mg by mouth daily.   vitamin E 400 UNIT capsule Take 400 Units by mouth daily.      No Known Allergies Follow-up Information    Ladell Pier, MD. Schedule an appointment as soon as possible for a visit in 1 week(s).   Specialty:  Oncology Why:  f/u in 1-2 weeks. Contact information: [place]  [id number] [phone]        [facility] [place] Follow up.   Contact information: [place] [phone]           The results of significant diagnostics from this hospitalization (including imaging, microbiology, ancillary and laboratory) are listed below for reference.    Significant Diagnostic Studies: Dg Chest Port 1 View  Result Date: 01/08/2018 CLINICAL DATA:  Fever.  Pancreatic cancer.  History of lung cancer. EXAM: PORTABLE CHEST 1 VIEW COMPARISON:  CT 12/02/2017 FINDINGS: Stable density in the left mid lung, likely postradiation/treatment changes. Left lower lobe atelectasis or infiltrate. Suspect small left effusion. The innumerable previously seen small subcentimeter pulmonary nodules are again noted compatible with widespread metastases. Mild cardiomegaly. Degenerative changes in the shoulders. IMPRESSION: Numerous tiny scattered pulmonary nodules as seen on prior chest CT compatible with metastases. Postradiation changes in the left mid lung. Left lower lobe atelectasis or infiltrate with small left effusion. Electronically Signed   By: Rolm Baptise M.D.   On: 01/08/2018 18:06   US Abdomen Limited Ruq  Result Date: 01/09/2018 CLINICAL DATA:  Elevated liver function tests EXAM: ULTRASOUND ABDOMEN LIMITED RIGHT UPPER QUADRANT COMPARISON:  CT 12/02/2017 FINDINGS: Gallbladder: Surgically absent Common bile duct: Diameter: Normal at 4 mm. Liver: Biliary duct dilatation is noted. Pneumobilia also noted. Findings similar to comparison CT. Portal vein is patent on color Doppler imaging with normal direction of blood flow towards the liver. IMPRESSION: 1. Intrahepatic biliary duct dilatation and pneumobilia are noted. Findings are similar to and potentially increased from CT 12/02/2017. 2. Common bile duct measures normal caliber. Electronically Signed   By: Suzy Bouchard M.D.   On: 01/09/2018 10:41     Microbiology: Recent Results (from the past 240 hour(s))  Blood Culture (routine x 2)     Status: None (Preliminary result)   Collection Time: 01/08/18  5:38 PM  Result Value Ref Range Status   Specimen Description   Final    BLOOD RIGHT ANTECUBITAL Performed at [facility], [place] [address], [place], [place] [address]    Special Requests   Final    BOTTLES DRAWN AEROBIC AND ANAEROBIC Blood Culture adequate volume Performed at [facility] [address] [place], [place] [address]    Culture PENDING  Incomplete   Report Status PENDING  Incomplete  Urine culture     Status: None   Collection Time: 01/08/18  5:38 PM  Result Value Ref Range Status   Specimen Description   Final    URINE, RANDOM Performed at [facility] [address] [place], [place] [address]    Special Requests   Final    NONE Performed at [facility], [place] [address] [place], [place] [address]    Culture   Final    NO GROWTH Performed at [facility], [place] [address] [place], [place] [address]    Report Status 01/10/2018 FINAL  Final  Blood Culture (routine x 2)     Status: None (Preliminary result)   Collection Time: 01/08/18  5:43 PM  Result Value Ref Range Status   Specimen Description   Final    BLOOD LEFT ANTECUBITAL Performed at [facility], [place] [address], [place], [place] [address]    Special Requests   Final    BOTTLES DRAWN AEROBIC AND ANAEROBIC Blood Culture adequate volume Performed at [facility] [address] [place],  [address]    Culture PENDING  Incomplete   Report Status PENDING  Incomplete     Labs: Basic Metabolic Panel: Recent Labs  Lab 01/08/18 1738 01/09/18 0356 01/09/18 0747 01/10/18 0411  NA 131* 135  --  134*  K 3.8 3.2*  --  4.1  CL 95* 100*  --  103  CO2 23 25  --  22  GLUCOSE 120* 103*  --  103*  BUN 9 9  --  7  CREATININE 0.62 0.67  --  0.67   CALCIUM 9.5 9.4  --  9.1  MG  --   --  1.6* 2.3   Liver Function Tests: Recent Labs  Lab 01/08/18 1738 01/09/18 0356  AST 73* 51*  ALT 52 43  ALKPHOS 1,282* 1,048*  BILITOT 2.2* 1.6*  PROT 6.4* 5.7*  ALBUMIN 2.8* 2.5*   No results for input(s): LIPASE, AMYLASE in the last 168 hours. No results for input(s): AMMONIA in the last 168 hours. CBC: Recent Labs  Lab 01/08/18 1738 01/09/18 0356 01/10/18 0411  WBC 5.7 4.1 4.2  NEUTROABS 4.9  --   --   HGB 9.3* 8.1* 7.6*  HCT 27.3* 24.8* 21.8*  MCV 87.5 89.5 88.6  PLT 168 160 209   Cardiac Enzymes: No results for input(s): CKTOTAL, CKMB, CKMBINDEX, TROPONINI in the last 168 hours. BNP: BNP (last 3 results) No results for input(s): BNP in the last 8760 hours.  ProBNP (last 3 results) No results for input(s): PROBNP in the last 8760 hours.  CBG: No results for input(s): GLUCAP in the last 168 hours.     Signed:  Irine Seal MD.  [facility] 01/10/2018, 11:02 AM

## 2018-01-11 ENCOUNTER — Inpatient Hospital Stay: Payer: PPO | Admitting: Nurse Practitioner

## 2018-01-11 ENCOUNTER — Inpatient Hospital Stay: Payer: PPO

## 2018-01-11 ENCOUNTER — Telehealth: Payer: Self-pay | Admitting: Nurse Practitioner

## 2018-01-11 LAB — HIV ANTIBODY (ROUTINE TESTING W REFLEX): HIV SCREEN 4TH GENERATION: NONREACTIVE

## 2018-01-11 NOTE — Telephone Encounter (Signed)
Schedule and cancelled appt per 5/20 sch message - pt daughter does not think she will need to come in - its getting harder to bring pt in and per hospice Rn may not need to come in any more.

## 2018-01-14 LAB — CULTURE, BLOOD (ROUTINE X 2)
Culture: NO GROWTH
Culture: NO GROWTH
SPECIAL REQUESTS: ADEQUATE
Special Requests: ADEQUATE

## 2018-01-21 ENCOUNTER — Ambulatory Visit: Payer: PPO | Admitting: Nurse Practitioner

## 2018-01-21 ENCOUNTER — Other Ambulatory Visit: Payer: Self-pay

## 2018-01-21 NOTE — Patient Outreach (Signed)
[facility]) Care Management  01/21/2018  Carol Cook 11-Sep-1926 [id number]  TELEPHONE SCREENING Referral date: 01/10/18 Referral source: [facility] Referral reason: Reason for consult functional decline. pain.   Diagnoses of Cancer with Mets  Insurance: [employer] team advantage  Per patient medical record.  Patient discharged from hospital on 01/10/18. Patient discharged home with [facility].  PLAN: No further follow up needed by [facility]   Quinn Plowman RN,BSN,CCM [facility] Telephonic  [phone]

## 2018-01-26 ENCOUNTER — Encounter (HOSPITAL_COMMUNITY): Payer: Self-pay | Admitting: *Deleted

## 2018-01-26 ENCOUNTER — Other Ambulatory Visit: Payer: Self-pay

## 2018-01-26 ENCOUNTER — Emergency Department (HOSPITAL_COMMUNITY)

## 2018-01-26 ENCOUNTER — Inpatient Hospital Stay (HOSPITAL_COMMUNITY)
Admission: EM | Admit: 2018-01-26 | Discharge: 2018-01-29 | DRG: 534 | Disposition: A | Discharge status: Hospice | Source: Hospice | Attending: Internal Medicine | Admitting: Internal Medicine

## 2018-01-26 DIAGNOSIS — R0902 Hypoxemia: Secondary | ICD-10-CM | POA: Diagnosis present

## 2018-01-26 DIAGNOSIS — C250 Malignant neoplasm of head of pancreas: Secondary | ICD-10-CM | POA: Diagnosis present

## 2018-01-26 DIAGNOSIS — T3695XA Adverse effect of unspecified systemic antibiotic, initial encounter: Secondary | ICD-10-CM | POA: Diagnosis present

## 2018-01-26 DIAGNOSIS — W19XXXA Unspecified fall, initial encounter: Secondary | ICD-10-CM | POA: Diagnosis not present

## 2018-01-26 DIAGNOSIS — Z8701 Personal history of pneumonia (recurrent): Secondary | ICD-10-CM

## 2018-01-26 DIAGNOSIS — S72411A Displaced unspecified condyle fracture of lower end of right femur, initial encounter for closed fracture: Secondary | ICD-10-CM

## 2018-01-26 DIAGNOSIS — R339 Retention of urine, unspecified: Secondary | ICD-10-CM | POA: Diagnosis not present

## 2018-01-26 DIAGNOSIS — J984 Other disorders of lung: Secondary | ICD-10-CM | POA: Diagnosis present

## 2018-01-26 DIAGNOSIS — L8990 Pressure ulcer of unspecified site, unspecified stage: Secondary | ICD-10-CM

## 2018-01-26 DIAGNOSIS — K521 Toxic gastroenteritis and colitis: Secondary | ICD-10-CM | POA: Diagnosis present

## 2018-01-26 DIAGNOSIS — Z515 Encounter for palliative care: Secondary | ICD-10-CM | POA: Diagnosis present

## 2018-01-26 DIAGNOSIS — M9701XA Periprosthetic fracture around internal prosthetic right hip joint, initial encounter: Secondary | ICD-10-CM | POA: Diagnosis present

## 2018-01-26 DIAGNOSIS — Z66 Do not resuscitate: Secondary | ICD-10-CM | POA: Diagnosis present

## 2018-01-26 DIAGNOSIS — Z87891 Personal history of nicotine dependence: Secondary | ICD-10-CM | POA: Diagnosis not present

## 2018-01-26 DIAGNOSIS — Z9049 Acquired absence of other specified parts of digestive tract: Secondary | ICD-10-CM

## 2018-01-26 DIAGNOSIS — R64 Cachexia: Secondary | ICD-10-CM | POA: Diagnosis present

## 2018-01-26 DIAGNOSIS — S7291XA Unspecified fracture of right femur, initial encounter for closed fracture: Secondary | ICD-10-CM

## 2018-01-26 DIAGNOSIS — J449 Chronic obstructive pulmonary disease, unspecified: Secondary | ICD-10-CM | POA: Diagnosis present

## 2018-01-26 DIAGNOSIS — S72001A Fracture of unspecified part of neck of right femur, initial encounter for closed fracture: Secondary | ICD-10-CM | POA: Diagnosis not present

## 2018-01-26 DIAGNOSIS — Z96652 Presence of left artificial knee joint: Secondary | ICD-10-CM | POA: Diagnosis present

## 2018-01-26 DIAGNOSIS — Z85118 Personal history of other malignant neoplasm of bronchus and lung: Secondary | ICD-10-CM

## 2018-01-26 DIAGNOSIS — S72431A Displaced fracture of medial condyle of right femur, initial encounter for closed fracture: Principal | ICD-10-CM | POA: Diagnosis present

## 2018-01-26 DIAGNOSIS — S80919A Unspecified superficial injury of unspecified knee, initial encounter: Secondary | ICD-10-CM | POA: Diagnosis not present

## 2018-01-26 DIAGNOSIS — J189 Pneumonia, unspecified organism: Secondary | ICD-10-CM | POA: Diagnosis not present

## 2018-01-26 DIAGNOSIS — D630 Anemia in neoplastic disease: Secondary | ICD-10-CM | POA: Diagnosis present

## 2018-01-26 DIAGNOSIS — W010XXA Fall on same level from slipping, tripping and stumbling without subsequent striking against object, initial encounter: Secondary | ICD-10-CM | POA: Diagnosis present

## 2018-01-26 DIAGNOSIS — Z6826 Body mass index (BMI) 26.0-26.9, adult: Secondary | ICD-10-CM

## 2018-01-26 DIAGNOSIS — L89151 Pressure ulcer of sacral region, stage 1: Secondary | ICD-10-CM | POA: Diagnosis present

## 2018-01-26 DIAGNOSIS — Z96641 Presence of right artificial hip joint: Secondary | ICD-10-CM | POA: Diagnosis present

## 2018-01-26 DIAGNOSIS — R000 Tachycardia, unspecified: Secondary | ICD-10-CM | POA: Diagnosis not present

## 2018-01-26 DIAGNOSIS — F10929 Alcohol use, unspecified with intoxication, unspecified: Secondary | ICD-10-CM | POA: Diagnosis not present

## 2018-01-26 DIAGNOSIS — D649 Anemia, unspecified: Secondary | ICD-10-CM | POA: Diagnosis present

## 2018-01-26 DIAGNOSIS — R52 Pain, unspecified: Secondary | ICD-10-CM | POA: Diagnosis not present

## 2018-01-26 DIAGNOSIS — Y92009 Unspecified place in unspecified non-institutional (private) residence as the place of occurrence of the external cause: Secondary | ICD-10-CM

## 2018-01-26 LAB — COMPREHENSIVE METABOLIC PANEL
ALT: 22 U/L (ref 14–54)
AST: 30 U/L (ref 15–41)
Albumin: 3 g/dL — ABNORMAL LOW (ref 3.5–5.0)
Alkaline Phosphatase: 385 U/L — ABNORMAL HIGH (ref 38–126)
Anion gap: 7 (ref 5–15)
BUN: 15 mg/dL (ref 6–20)
CHLORIDE: 101 mmol/L (ref 101–111)
CO2: 26 mmol/L (ref 22–32)
CREATININE: 0.64 mg/dL (ref 0.44–1.00)
Calcium: 9.3 mg/dL (ref 8.9–10.3)
Glucose, Bld: 131 mg/dL — ABNORMAL HIGH (ref 65–99)
Potassium: 4.2 mmol/L (ref 3.5–5.1)
Sodium: 134 mmol/L — ABNORMAL LOW (ref 135–145)
TOTAL PROTEIN: 5.8 g/dL — AB (ref 6.5–8.1)
Total Bilirubin: 1.4 mg/dL — ABNORMAL HIGH (ref 0.3–1.2)

## 2018-01-26 LAB — CBC WITH DIFFERENTIAL/PLATELET
Basophils Absolute: 0 10*3/uL (ref 0.0–0.1)
Basophils Relative: 0 %
EOS PCT: 0 %
Eosinophils Absolute: 0 10*3/uL (ref 0.0–0.7)
HCT: 26.6 % — ABNORMAL LOW (ref 36.0–46.0)
Hemoglobin: 8.7 g/dL — ABNORMAL LOW (ref 12.0–15.0)
LYMPHS ABS: 0.3 10*3/uL — AB (ref 0.7–4.0)
LYMPHS PCT: 3 %
MCH: 29.9 pg (ref 26.0–34.0)
MCHC: 32.7 g/dL (ref 30.0–36.0)
MCV: 91.4 fL (ref 78.0–100.0)
MONO ABS: 0.7 10*3/uL (ref 0.1–1.0)
Monocytes Relative: 5 %
Neutro Abs: 11.6 10*3/uL — ABNORMAL HIGH (ref 1.7–7.7)
Neutrophils Relative %: 92 %
PLATELETS: 311 10*3/uL (ref 150–400)
RBC: 2.91 MIL/uL — AB (ref 3.87–5.11)
RDW: 17.6 % — AB (ref 11.5–15.5)
WBC: 12.6 10*3/uL — ABNORMAL HIGH (ref 4.0–10.5)

## 2018-01-26 LAB — URINALYSIS, ROUTINE W REFLEX MICROSCOPIC
BILIRUBIN URINE: NEGATIVE
Bacteria, UA: NONE SEEN
GLUCOSE, UA: NEGATIVE mg/dL
HGB URINE DIPSTICK: NEGATIVE
Ketones, ur: 5 mg/dL — AB
Leukocytes, UA: NEGATIVE
NITRITE: NEGATIVE
PROTEIN: 30 mg/dL — AB
SPECIFIC GRAVITY, URINE: 1.02 (ref 1.005–1.030)
pH: 5 (ref 5.0–8.0)

## 2018-01-26 LAB — PROTIME-INR
INR: 1.17
PROTHROMBIN TIME: 14.9 s (ref 11.4–15.2)

## 2018-01-26 MED ORDER — ONDANSETRON HCL 4 MG/2ML IJ SOLN
4.0000 mg | Freq: Four times a day (QID) | INTRAMUSCULAR | Status: DC | PRN
  Administered 2018-01-27: 4 mg via INTRAVENOUS
  Filled 2018-01-26: qty 2

## 2018-01-26 MED ORDER — HYDROMORPHONE HCL 2 MG PO TABS: 2.0000 mg | ORAL_TABLET | Freq: Four times a day (QID) | ORAL | Status: DC | PRN

## 2018-01-26 MED ORDER — SODIUM CHLORIDE 0.9 % IV SOLN
INTRAVENOUS | Status: DC
  Administered 2018-01-26 – 2018-01-29 (×3): via INTRAVENOUS

## 2018-01-26 MED ORDER — ENOXAPARIN SODIUM 40 MG/0.4ML ~~LOC~~ SOLN
40.0000 mg | SUBCUTANEOUS | Status: DC
Start: 2018-01-26 — End: 2018-01-29
  Administered 2018-01-26 – 2018-01-28 (×3): 40 mg via SUBCUTANEOUS
  Filled 2018-01-26 (×3): qty 0.4

## 2018-01-26 MED ORDER — HYDROMORPHONE HCL 2 MG PO TABS
2.0000 mg | ORAL_TABLET | Freq: Four times a day (QID) | ORAL | Status: DC | PRN
  Administered 2018-01-26: 2 mg via ORAL
  Filled 2018-01-26: qty 1

## 2018-01-26 MED ORDER — FENTANYL CITRATE (PF) 100 MCG/2ML IJ SOLN
50.0000 ug | Freq: Once | INTRAMUSCULAR | Status: AC
  Administered 2018-01-26: 50 ug via INTRAVENOUS
  Filled 2018-01-26: qty 2

## 2018-01-26 MED ORDER — POLYETHYLENE GLYCOL 3350 17 GM/SCOOP PO POWD
17.0000 g | Freq: Every day | ORAL | Status: DC
Start: 2018-01-26 — End: 2018-01-27
  Filled 2018-01-26: qty 255

## 2018-01-26 MED ORDER — SODIUM CHLORIDE 0.9 % IV SOLN
1.0000 g | Freq: Once | INTRAVENOUS | Status: AC
  Administered 2018-01-26: 1 g via INTRAVENOUS
  Filled 2018-01-26: qty 1

## 2018-01-26 MED ORDER — SODIUM CHLORIDE 0.9 % IV SOLN
1.0000 g | Freq: Three times a day (TID) | INTRAVENOUS | Status: DC
  Administered 2018-01-27 (×2): 1 g via INTRAVENOUS
  Filled 2018-01-26 (×2): qty 1

## 2018-01-26 MED ORDER — IPRATROPIUM-ALBUTEROL 0.5-2.5 (3) MG/3ML IN SOLN
3.0000 mL | Freq: Four times a day (QID) | RESPIRATORY_TRACT | Status: DC
  Administered 2018-01-26: 3 mL via RESPIRATORY_TRACT
  Filled 2018-01-26: qty 3

## 2018-01-26 MED ORDER — PANTOPRAZOLE SODIUM 40 MG PO TBEC: 40.0000 mg | DELAYED_RELEASE_TABLET | Freq: Every day | ORAL | Status: DC

## 2018-01-26 MED ORDER — FENTANYL CITRATE (PF) 100 MCG/2ML IJ SOLN: 50.0000 ug | Freq: Once | INTRAMUSCULAR | Status: DC

## 2018-01-26 MED ORDER — PANTOPRAZOLE SODIUM 40 MG PO TBEC
40.0000 mg | DELAYED_RELEASE_TABLET | Freq: Every day | ORAL | Status: DC
  Administered 2018-01-26 – 2018-01-28 (×3): 40 mg via ORAL
  Filled 2018-01-26 (×3): qty 1

## 2018-01-26 MED ORDER — VANCOMYCIN HCL 10 G IV SOLR
1250.0000 mg | Freq: Once | INTRAVENOUS | Status: AC
  Administered 2018-01-26: 1250 mg via INTRAVENOUS
  Filled 2018-01-26: qty 1250

## 2018-01-26 MED ORDER — ONDANSETRON HCL 4 MG/2ML IJ SOLN
4.0000 mg | Freq: Once | INTRAMUSCULAR | Status: AC
  Administered 2018-01-26: 4 mg via INTRAVENOUS
  Filled 2018-01-26: qty 2

## 2018-01-26 MED ORDER — HEPARIN SODIUM (PORCINE) 5000 UNIT/ML IJ SOLN: 5000.0000 [IU] | Freq: Three times a day (TID) | INTRAMUSCULAR | Status: DC

## 2018-01-26 MED ORDER — VANCOMYCIN HCL IN DEXTROSE 750-5 MG/150ML-% IV SOLN
750.0000 mg | Freq: Two times a day (BID) | INTRAVENOUS | Status: DC
  Filled 2018-01-26: qty 150

## 2018-01-26 MED ORDER — MORPHINE SULFATE (PF) 2 MG/ML IV SOLN
2.0000 mg | INTRAVENOUS | Status: DC | PRN
  Administered 2018-01-26 – 2018-01-27 (×3): 2 mg via INTRAVENOUS
  Filled 2018-01-26 (×4): qty 1

## 2018-01-26 NOTE — Progress Notes (Addendum)
[facility] Hospital Liaison:  RN visit ° °Met with patient and children at bedside.  Patient had just been returned from xray with no results at that time.  I spoke with Angela,CMRN who advised that patient does have 2 femur fractures around her hip prostethic and that her children are advising that they only want pain control and no surgeries.   ° °Patient is currently endorsing pain in R hip area.  Bedside RN was aware.  Per Angela, CMRN, patient's family requesting hospital bed and over the bed table.  I will order for patient.  If there are any hospice related questions or concerns, please feel free to call.  As patient is a current [facility] patient, please use [facility] for transport home, if needed.  ° °Thank you, ° ° °Amy Evans, RN, BSN °[facility] Hospital Liaison °[phone] ° °All hospital liaisons are now on AMION.  ° ° °

## 2018-01-26 NOTE — ED Provider Notes (Signed)
[facility] DEPT Provider Note   CSN: [id number] Arrival date & time: 01/26/18  8756     History   Chief Complaint Chief Complaint  Patient presents with  . Fall  . Knee Pain    HPI Carol Cook is a 84 y.o. female.  The history is provided by the patient and medical records.  Fall  This is a new problem. The current episode started 1 to 2 hours ago. The problem occurs constantly. The problem has not changed since onset.Pertinent negatives include no chest pain, no abdominal pain, no headaches and no shortness of breath. Nothing aggravates the symptoms. Nothing relieves the symptoms. She has tried nothing for the symptoms. The treatment provided no relief.  Knee Pain   This is a new problem. The current episode started 1 to 2 hours ago. The problem occurs constantly. The problem has not changed since onset.The pain is present in the right hip, right upper leg and right knee. The quality of the pain is described as aching. The pain is moderate. The symptoms are aggravated by contact. She has tried nothing for the symptoms. The treatment provided no relief. There has been a history of trauma.    Past Medical History:  Diagnosis Date  . COPD (chronic obstructive pulmonary disease) ([place])   . Non-small cell lung cancer (NSCLC) ([place])   . Pancreatic cancer ([place]) 07/01/2017   Adenocarcinoma  . Post-radiation pneumonitis [facility])     Patient Active Problem List   Diagnosis Date Noted  . Fever   . Hypokalemia 01/09/2018  . Hypomagnesemia 01/09/2018  . Pressure injury of coccygeal region, stage 1 01/09/2018  . Elevated alkaline phosphatase level   . Febrile illness   . Metastasis from pancreatic cancer ([place])   . CAP (community acquired pneumonia) 01/08/2018  . Encounter for palliative care 12/23/2017  . Primary cancer of head of pancreas ([place]) 07/13/2017  . Obstructive jaundice due to malignant neoplasm ([place]) 06/25/2017  . Cough 03/26/2016  . Dyspnea  01/07/2016  . Post-radiation pneumonitis ([place]) 10/29/2015  . Non-small cell cancer of left lung ([place]) 10/23/2015  . COPD (chronic obstructive pulmonary disease) ([place]) 12/06/2014    Past Surgical History:  Procedure Laterality Date  . APPENDECTOMY    . cataracts     surgery  . CHOLECYSTECTOMY    . REPLACEMENT TOTAL KNEE Left   . TOTAL HIP ARTHROPLASTY Right      OB History   None      Home Medications    Prior to Admission medications   Medication Sig Start Date End Date Taking? Authorizing Provider  acetaminophen (TYLENOL) 325 MG tablet Take 2 tablets (650 mg total) by mouth every 6 (six) hours as needed for mild pain (or Fever >/= 101). 01/10/18   Eugenie Filler, MD  Carboxymethylcellulose Sodium (EYE DROPS OP) Place 1 drop into both eyes daily as needed (moisten eyes).    [provider]  cholecalciferol (VITAMIN D) 1000 units tablet Take 1,000 Units by mouth daily.    [provider]  escitalopram (LEXAPRO) 10 MG tablet TK 1 T PO D 10/15/17   [provider]  fluticasone (FLONASE) 50 MCG/ACT nasal spray Place 2 sprays into both nostrils daily. Patient not taking: Reported on 01/08/2018 03/26/16   Collene Gobble, MD  guaiFENesin-dextromethorphan [facility] DM) 100-10 MG/5ML syrup Take 5 mLs by mouth every 4 (four) hours as needed for cough. 01/10/18   Eugenie Filler, MD  HYDROmorphone (DILAUDID) 2 MG tablet Take  1-2 tablets (2-4 mg total) by mouth every 6 (six) hours as needed for severe pain. 01/06/18   Owens Shark, NP  mirtazapine (REMERON) 15 MG tablet TK 1 T PO HS FOR SLEEP AND APPETITE 12/06/17   [provider]  Multiple Vitamins-Minerals (OPTIVITE PO) Take 1 capsule by mouth daily.    [provider]  ondansetron (ZOFRAN) 8 MG tablet TAKE 1 TABLET(8 MG) BY MOUTH EVERY 8 HOURS AS NEEDED FOR NAUSEA OR VOMITING 12/24/17   Owens Shark, NP  pantoprazole (PROTONIX) 40 MG tablet TAKE 1 TABLET(40 MG) BY MOUTH DAILY 01/10/18    Eugenie Filler, MD  polyethylene glycol powder (MIRALAX) powder Take 17 g by mouth daily. 11/03/17   Palumbo, April, MD  prednisoLONE acetate (PRED FORTE) 1 % ophthalmic suspension INT 1 GTT IN OD D START ON mon. wed and friday 04/14/17   [provider]  PROAIR HFA 108 (90 Base) MCG/ACT inhaler INHALE 1 PUFF INTO LUNGS EVERY 4 HOURS AS NEEDED 04/14/17   Collene Gobble, MD  temazepam (RESTORIL) 15 MG capsule Take 1 capsule (15 mg total) by mouth at bedtime as needed for sleep. 08/16/17   Hayden Pedro, PA-C  Tiotropium Bromide-Olodaterol (STIOLTO RESPIMAT) 2.5-2.5 MCG/ACT AERS Inhale 2 puffs into the lungs daily. 09/20/17   Collene Gobble, MD  vitamin C (ASCORBIC ACID) 500 MG tablet Take 500 mg by mouth daily.    [provider]  vitamin E 400 UNIT capsule Take 400 Units by mouth daily.    [provider]    Family History Family History  Problem Relation Age of Onset  . Dementia Mother   . Hypertension Mother   . Deep vein thrombosis Father     Social History Social History   Tobacco Use  . Smoking status: Former Smoker    Packs/day: 0.75    Years: 60.00    Pack years: 45.00    Types: Cigarettes    Last attempt to quit: 08/25/2011    Years since quitting: 6.4  . Smokeless tobacco: Never Used  Substance Use Topics  . Alcohol use: Yes    Comment: occ  . Drug use: No     Allergies   Patient has no known allergies.   Review of Systems Review of Systems  Constitutional: Negative for chills, fatigue and fever.  HENT: Negative for congestion.   Respiratory: Negative for cough, chest tightness, shortness of breath and wheezing.   Cardiovascular: Negative for chest pain and leg swelling.  Gastrointestinal: Negative for abdominal pain, constipation, diarrhea, nausea and vomiting.  Genitourinary: Negative for flank pain.  Musculoskeletal: Negative for back pain, neck pain and neck stiffness.  Skin: Negative for rash and wound.    Neurological: Negative for headaches.  Psychiatric/Behavioral: Negative for confusion.  All other systems reviewed and are negative.    Physical Exam Updated Vital Signs There were no vitals taken for this visit.  Physical Exam  Constitutional: She is oriented to person, place, and time. She appears well-developed and well-nourished. No distress.  HENT:  Head: Normocephalic and atraumatic.  Nose: Nose normal.  Mouth/Throat: Oropharynx is clear and moist. No oropharyngeal exudate.  Eyes: Pupils are equal, round, and reactive to light. Conjunctivae and EOM are normal.  Neck: Normal range of motion. Neck supple.  Cardiovascular: Normal rate and intact distal pulses.  Murmur heard. Pulmonary/Chest: No respiratory distress. She has no wheezes. She exhibits no tenderness.  Abdominal: She exhibits no distension. There is no tenderness.  Musculoskeletal: She exhibits tenderness and deformity (R leg shortnened (reportedly baseline)).       Right knee: Tenderness found.       Legs: Lymphadenopathy:    She has no cervical adenopathy.  Neurological: She is alert and oriented to person, place, and time. No sensory deficit.  Skin: Capillary refill takes less than 2 seconds. No rash noted. She is not diaphoretic. No erythema.  Nursing note and vitals reviewed.    ED Treatments / Results  Labs (all labs ordered are listed, but only abnormal results are displayed) Labs Reviewed  URINE CULTURE  CULTURE, BLOOD (ROUTINE X 2)  CULTURE, BLOOD (ROUTINE X 2)  CBC WITH DIFFERENTIAL/PLATELET  COMPREHENSIVE METABOLIC PANEL  PROTIME-INR  URINALYSIS, ROUTINE W REFLEX MICROSCOPIC  CBC  CREATININE, SERUM  BASIC METABOLIC PANEL  CBC    EKG None  Radiology Dg Chest 2 View  Result Date: 01/26/2018 CLINICAL DATA:  Recent fall with shortness of breath EXAM: CHEST - 2 VIEW COMPARISON:  01/08/2018 FINDINGS: Cardiac shadow is mildly enlarged but stable. The previously seen post radiation changes in  the left lung are stable. Some increasing density in the left lung base is seen likely related to a new superimposed infiltrate. Large hiatal hernia is again identified. Previously seen nodular changes are not well appreciated. Multilevel compression deformities in the thoracic spine are again seen. Increase in the degree of vascular congestion is noted as well. IMPRESSION: Stable cardiomegaly. Stable hiatal hernia. Significant increase in left basilar infiltrate superimposed over chronic changes in the left mid lung. Vascular congestion is noted as well. Electronically Signed   By: Inez Catalina M.D.   On: 01/26/2018 15:16   Dg Knee Complete 4 Views Right  Result Date: 01/26/2018 CLINICAL DATA:  Right knee pain after fall. EXAM: RIGHT KNEE - COMPLETE 4+ VIEW COMPARISON:  None. FINDINGS: Moderately displaced fracture is seen involving the medial femoral condyle with extension to the supracondylar portion. Intra-articular extension is noted. Fat fluid level is noted in suprapatellar joint space. Moderate degenerative joint disease is noted laterally. IMPRESSION: Moderately displaced fracture is seen involving the medial femoral condyle with intra-articular extension. Electronically Signed   By: Marijo Conception, M.D.   On: 01/26/2018 10:21   Dg Hip Unilat W Or Wo Pelvis 2-3 Views Right  Result Date: 01/26/2018 CLINICAL DATA:  Right hip pain after fall. EXAM: DG HIP (WITH OR WITHOUT PELVIS) 2-3V RIGHT COMPARISON:  None. FINDINGS: Status post right total hip arthroplasty. Vascular calcifications are noted. Probable minimally displaced periprosthetic fracture is noted in the proximal femur. IMPRESSION: Probable minimally displaced periprosthetic fracture is noted in proximal right fifth Electronically Signed   By: Marijo Conception, M.D.   On: 01/26/2018 10:26   Dg Femur Min 2 Views Right  Result Date: 01/26/2018 CLINICAL DATA:  Right femoral pain after fall. EXAM: RIGHT FEMUR 2 VIEWS COMPARISON:  None. FINDINGS:  Status post right total hip arthroplasty. Minimally displaced periprosthetic fracture cannot be excluded. Moderately displaced fracture is seen involving the medial femoral condyle with intra-articular extension. Fat fluid level is noted in suprapatellar joint space. Vascular calcifications are noted. IMPRESSION: Moderately displaced medial femoral condyle fracture with intra-articular extension. Status post right total hip arthroplasty; minimally displaced periprosthetic fracture involving proximal femur cannot be excluded. Dedicated radiographs of the right hip are recommended. Electronically Signed   By: Marijo Conception, M.D.   On: 01/26/2018 10:25    Procedures Procedures (including critical care time)  CRITICAL CARE Performed by: Harrell Gave  J Sherina Stammer Total critical care time: 35 minutes Critical care time was exclusive of separately billable procedures and treating other patients. Critical care was necessary to treat or prevent imminent or life-threatening deterioration. Critical care was time spent personally by me on the following activities: development of treatment plan with patient and/or surrogate as well as nursing, discussions with consultants, evaluation of patient's response to treatment, examination of patient, obtaining history from patient or surrogate, ordering and performing treatments and interventions, ordering and review of laboratory studies, ordering and review of radiographic studies, pulse oximetry and re-evaluation of patient's condition.   Medications Ordered in ED Medications  pantoprazole (PROTONIX) EC tablet 40 mg (40 mg Oral Given 01/26/18 1322)  fentaNYL (SUBLIMAZE) injection 50 mcg (has no administration in time range)  ceFEPIme (MAXIPIME) 1 g in sodium chloride 0.9 % 100 mL IVPB (has no administration in time range)  vancomycin (VANCOCIN) 1,250 mg in sodium chloride 0.9 % 250 mL IVPB (has no administration in time range)  fentaNYL (SUBLIMAZE) injection 50 mcg  (50 mcg Intravenous Given 01/26/18 1113)  ondansetron (ZOFRAN) injection 4 mg (4 mg Intravenous Given 01/26/18 1112)  fentaNYL (SUBLIMAZE) injection 50 mcg (50 mcg Intravenous Given 01/26/18 1322)     Initial Impression / Assessment and Plan / ED Course  I have reviewed the triage vital signs and the nursing notes.  Pertinent labs & imaging results that were available during my care of the patient were reviewed by me and considered in my medical decision making (see chart for details).     Carol Cook is a 84 y.o. female with a past medical history significant for pancreatic cancer on hospice therapy, COPD, prior lung cancer, and prior right hip fracture at age 67 who presents with fall.  Patient reports that she is going to the bathroom at 7 AM this morning when she slipped on the ground falling and having onset of pain in her right knee, right leg, and right hip.  She reports that the pain is new.  She denies loss of consciousness, hitting her head, or neck pain.  She denies any complaints aside from the right leg pain.  She reports that her right leg is shortened from a right hip fracture at age 16.  She reports that the shortness is not new.  She denies any chest pain, back pain, abdominal pain or any other complaints.  She is not interested in having work-up for anything except for the leg as she did not want to come in today.  On exam, patient has tenderness in the right knee, right thigh, and right hip.  There is pain with manipulation and movement of the knee and hip.  Patient had symmetric dopplerable pulses however no pulses palpated in the DP or PT bilaterally.  She had normal sensation and strength in her ankles.  Abdomen nontender and lungs clear.  Chest is nontender.  Neck is nontender.  Based on patient's wishes of only focusing on her knee and her ongoing palliative hospice management, patient will have x-rays of the right leg.  Anticipate reassessment after imaging.  2:08 PM X-rays  revealed multiple femur fractures.  One is a periprosthetic femur fracture in the upper femur near the hip and one is a distal femur fracture near the knee.  Family was informed of the finding and a do not want to pursue surgery.  Orthopedics was called and they recommended a knee immobilizer and nonweightbearing due to the upper fracture.  They agreed that patient would  likely be in severe pain due to the injuries.  Patient required several doses of pain medication.  She was found to have low oxygen saturations in the 80s and was placed on nasal cannula oxygen supplementation.  Patient will have upper testing and chest x-ray to determine if she has a pneumonia or other significant abnormality causing her hypoxia.  Hospice team was called and they expressed concern about the patient going home as she lives at home alone.  Patient is unable to roll in bed, stand, ambulate, and is hypoxic.  This raises concern for safety at home.  Hospitalist team will be called for admission for monitoring of her respiratory status with hypoxia as well as continued pain management and likely PT/OT evaluation to determine level of care needs.  3:21 PM Chest x-ray shows evidence of new infiltrate.  As patient was recently admitted for pneumonia, patient be treated for hospital associated pneumonia.  Next  Patient be admitted for hospital associated pneumonia, new hypoxia, and pain management with her new fractures.    Final Clinical Impressions(s) / ED Diagnoses   Final diagnoses:  Fall, initial encounter  HCAP (healthcare-associated pneumonia)  Multiple closed fractures of right femur, initial encounter ([place])  Hypoxia     Clinical Impression: 1. Fall, initial encounter   2. HCAP (healthcare-associated pneumonia)   3. Multiple closed fractures of right femur, initial encounter ([place])   4. Hypoxia     Disposition: Admit  This note was prepared with assistance of Dragon voice recognition software.  Occasional wrong-word or sound-a-like substitutions may have occurred due to the inherent limitations of voice recognition software.     Tyliyah Mcmeekin, Gwenyth Allegra, MD 01/26/18 [phone]

## 2018-01-26 NOTE — ED Notes (Signed)
ED TO INPATIENT HANDOFF REPORT  Name/Age/Gender Carol Cook 84 y.o. female  Code Status    Code Status Orders  (From admission, onward)        Start     Ordered   01/26/18 1624  Do not attempt resuscitation (DNR)  Continuous    Question Answer Comment  In the event of cardiac or respiratory ARREST Do not call a "code blue"   In the event of cardiac or respiratory ARREST Do not perform Intubation, CPR, defibrillation or ACLS   In the event of cardiac or respiratory ARREST Use medication by any route, position, wound care, and other measures to relive pain and suffering. May use oxygen, suction and manual treatment of airway obstruction as needed for comfort.      01/26/18 1624    Code Status History    Date Active Date Inactive Code Status Order ID Comments User Context   01/08/2018 2133 01/10/2018 1811 DNR [id number]  Etta Quill, DO ED   06/25/2017 2349 06/28/2017 2022 Full Code [id number]  Etta Quill, DO ED    Advance Directive Documentation     Most Recent Value  Type of Advance Directive  Healthcare Power of Attorney, Living will  Pre-existing out of facility DNR order (yellow form or pink MOST form)  -  "MOST" Form in Place?  -      Home/SNF/Other Home  Chief Complaint sfsd  Level of Care/Admitting Diagnosis ED Disposition    ED Disposition Condition [place]: [facility] [[id number]]  Level of Care: Telemetry [5]  Admit to tele based on following criteria: Other see comments  Comments: Hypoxia  Diagnosis: Femur fracture, right [facility]) [[id number]]  Admitting Physician: Shelly Coss [[id number]]  Attending Physician: Shelly Coss [[id number]]  Estimated length of stay: past midnight tomorrow  Certification:: I certify this patient will need inpatient services for at least 2 midnights  PT Class (Do Not Modify): Inpatient [101]  PT Acc Code (Do Not Modify): Private [1]       Medical History Past Medical History:   Diagnosis Date  . COPD (chronic obstructive pulmonary disease) ([place])   . Non-small cell lung cancer (NSCLC) ([place])   . Pancreatic cancer ([place]) 07/01/2017   Adenocarcinoma  . Post-radiation pneumonitis ([facility])     Allergies No Known Allergies  IV Location/Drains/Wounds Patient Lines/Drains/Airways Status   Active Line/Drains/Airways    Name:   Placement date:   Placement time:   Site:   Days:   Peripheral IV 01/26/18   01/26/18    -    -   less than 1   Pressure Injury 01/08/18 Stage I -  Intact skin with non-blanchable redness of a localized area usually over a bony prominence.   01/08/18    2300     18          Labs/Imaging No results found for this or any previous visit (from the past 48 hour(s)). Dg Chest 2 View  Result Date: 01/26/2018 CLINICAL DATA:  Recent fall with shortness of breath EXAM: CHEST - 2 VIEW COMPARISON:  01/08/2018 FINDINGS: Cardiac shadow is mildly enlarged but stable. The previously seen post radiation changes in the left lung are stable. Some increasing density in the left lung base is seen likely related to a new superimposed infiltrate. Large hiatal hernia is again identified. Previously seen nodular changes are not well appreciated. Multilevel compression deformities in the thoracic spine are again seen. Increase in the degree  of vascular congestion is noted as well. IMPRESSION: Stable cardiomegaly. Stable hiatal hernia. Significant increase in left basilar infiltrate superimposed over chronic changes in the left mid lung. Vascular congestion is noted as well. Electronically Signed   By: Inez Catalina M.D.   On: 01/26/2018 15:16   Dg Knee Complete 4 Views Right  Result Date: 01/26/2018 CLINICAL DATA:  Right knee pain after fall. EXAM: RIGHT KNEE - COMPLETE 4+ VIEW COMPARISON:  None. FINDINGS: Moderately displaced fracture is seen involving the medial femoral condyle with extension to the supracondylar portion. Intra-articular extension is noted. Fat fluid level is  noted in suprapatellar joint space. Moderate degenerative joint disease is noted laterally. IMPRESSION: Moderately displaced fracture is seen involving the medial femoral condyle with intra-articular extension. Electronically Signed   By: Marijo Conception, M.D.   On: 01/26/2018 10:21   Dg Hip Unilat W Or Wo Pelvis 2-3 Views Right  Result Date: 01/26/2018 CLINICAL DATA:  Right hip pain after fall. EXAM: DG HIP (WITH OR WITHOUT PELVIS) 2-3V RIGHT COMPARISON:  None. FINDINGS: Status post right total hip arthroplasty. Vascular calcifications are noted. Probable minimally displaced periprosthetic fracture is noted in the proximal femur. IMPRESSION: Probable minimally displaced periprosthetic fracture is noted in proximal right fifth Electronically Signed   By: Marijo Conception, M.D.   On: 01/26/2018 10:26   Dg Femur Min 2 Views Right  Result Date: 01/26/2018 CLINICAL DATA:  Right femoral pain after fall. EXAM: RIGHT FEMUR 2 VIEWS COMPARISON:  None. FINDINGS: Status post right total hip arthroplasty. Minimally displaced periprosthetic fracture cannot be excluded. Moderately displaced fracture is seen involving the medial femoral condyle with intra-articular extension. Fat fluid level is noted in suprapatellar joint space. Vascular calcifications are noted. IMPRESSION: Moderately displaced medial femoral condyle fracture with intra-articular extension. Status post right total hip arthroplasty; minimally displaced periprosthetic fracture involving proximal femur cannot be excluded. Dedicated radiographs of the right hip are recommended. Electronically Signed   By: Marijo Conception, M.D.   On: 01/26/2018 10:25    Pending Labs Unresulted Labs (From admission, onward)   Start     Ordered   01/27/18 4235  Basic metabolic panel  Tomorrow morning,   R     01/26/18 1624   01/27/18 0500  CBC  Tomorrow morning,   R     01/26/18 1624   01/26/18 1623  CBC  (heparin)  Once,   R    Comments:  Baseline for heparin therapy  IF NOT ALREADY DRAWN.  Notify MD if PLT < 100 K.    01/26/18 1624   01/26/18 1623  Creatinine, serum  (heparin)  Once,   R    Comments:  Baseline for heparin therapy IF NOT ALREADY DRAWN.    01/26/18 1624   01/26/18 1521  Blood culture (routine x 2)  BLOOD CULTURE X 2,   STAT     01/26/18 1520   01/26/18 1412  Urinalysis, Routine w reflex microscopic  Once,   R     01/26/18 1411   01/26/18 1412  Urine culture  STAT,   STAT     01/26/18 1411   01/26/18 1058  Protime-INR  STAT,   STAT     01/26/18 1057   01/26/18 1057  CBC with Differential  STAT,   STAT     01/26/18 1057   01/26/18 1057  Comprehensive metabolic panel  STAT,   STAT     01/26/18 1057      Vitals/Pain Today's  Vitals   01/26/18 1330 01/26/18 1400 01/26/18 1535 01/26/18 1544  BP: 122/70 106/65 95/75   Pulse: 88 85 86   Resp:  16    Temp:      TempSrc:      SpO2: 94% 92% 99%   PainSc:    2     Isolation Precautions No active isolations  Medications Medications  pantoprazole (PROTONIX) EC tablet 40 mg (40 mg Oral Given 01/26/18 1322)  fentaNYL (SUBLIMAZE) injection 50 mcg (has no administration in time range)  vancomycin (VANCOCIN) 1,250 mg in sodium chloride 0.9 % 250 mL IVPB (1,250 mg Intravenous New Bag/Given 01/26/18 1616)  pantoprazole (PROTONIX) EC tablet 40 mg (has no administration in time range)  polyethylene glycol powder (GLYCOLAX/MIRALAX) container 17 g (has no administration in time range)  enoxaparin (LOVENOX) injection 30 mg (has no administration in time range)  ipratropium-albuterol (DUONEB) 0.5-2.5 (3) MG/3ML nebulizer solution 3 mL (has no administration in time range)  morphine 2 MG/ML injection 2 mg (has no administration in time range)  HYDROmorphone (DILAUDID) tablet 2 mg (has no administration in time range)  0.9 %  sodium chloride infusion (has no administration in time range)  vancomycin (VANCOCIN) IVPB 750 mg/150 ml premix (has no administration in time range)  ceFEPIme (MAXIPIME) 1 g  in sodium chloride 0.9 % 100 mL IVPB (has no administration in time range)  fentaNYL (SUBLIMAZE) injection 50 mcg (50 mcg Intravenous Given 01/26/18 1113)  ondansetron (ZOFRAN) injection 4 mg (4 mg Intravenous Given 01/26/18 1112)  fentaNYL (SUBLIMAZE) injection 50 mcg (50 mcg Intravenous Given 01/26/18 1322)  ceFEPIme (MAXIPIME) 1 g in sodium chloride 0.9 % 100 mL IVPB (0 g Intravenous Stopped 01/26/18 1616)    Mobility non-ambulatory

## 2018-01-26 NOTE — Progress Notes (Signed)
Pt's daughter, Pamala Hurry requesting updates on her mother. Attempted to call [phone], but no answer. Pt made aware. Will continue to monitor.

## 2018-01-26 NOTE — ED Notes (Signed)
Bed: WA21 Expected date:  Expected time:  Means of arrival:  Comments: EMS/Diz[place]/fall/knee pain

## 2018-01-26 NOTE — H&P (Addendum)
History and Physical    Carol Cook FGH:[id number] DOB: 04/28/1927 DOA: 01/26/2018  PCP: Velna Hatchet, MD   Patient coming from: Home    Chief Complaint: Knee pain  HPI: Carol Cook is a 84 y.o. female with medical history significant of advanced pancreatic cancer, on hospice at home,COPD,H/O lung cancer who presents to the emergency department after she fell this morning and immediately developed pain on the right knee  and hip.  Patient was attempting to go to the bathroom from her bed when she slipped and fell on the ground hitting her right lower extremity.  Patient developed pain immediately on her right knee, right leg and right hip.  Denies any other injury.  Patient was also found to be hypoxic on presentation.  She has history of COPD but is not on any home oxygen.  Patient was found to have severe tenderness on the right knee, right thigh and right hip.  X-ray showed moderately displaced fracture of medial femoral condyle and also minimally displaced periprosthetic fracture of proximal femur.  She has history of  right hip fracture and had underwent right total hip arthroplasty in the past.  Since patient was found to be hypoxic on presentation, chest x-rays was obtained which showed new left basilar infiltrate.  Orthopedics was consulted.  Family did not want to pursue any treatment for her femur fracture and she is being admitted for pain management and treatment of pneumonia.   ED Course: Chest x-ray and knee x-ray as above.  Given broad-spectrum antibiotics vancomycin and Zosyn for PNA.  Orthopedics recommended knee immobilizer and nonweightbearing.  Review of Systems: As per HPI otherwise 10 point review of systems negative.    Past Medical History:  Diagnosis Date  . COPD (chronic obstructive pulmonary disease) ([place])   . Non-small cell lung cancer (NSCLC) ([place])   . Pancreatic cancer ([place]) 07/01/2017   Adenocarcinoma  . Post-radiation pneumonitis [facility])     Past Surgical  History:  Procedure Laterality Date  . APPENDECTOMY    . cataracts     surgery  . CHOLECYSTECTOMY    . REPLACEMENT TOTAL KNEE Left   . TOTAL HIP ARTHROPLASTY Right      reports that she quit smoking about 6 years ago. Her smoking use included cigarettes. She has a 45.00 pack-year smoking history. She has never used smokeless tobacco. She reports that she drinks alcohol. She reports that she does not use drugs.  No Known Allergies  Family History  Problem Relation Age of Onset  . Dementia Mother   . Hypertension Mother   . Deep vein thrombosis Father      Prior to Admission medications   Medication Sig Start Date End Date Taking? Authorizing Provider  Carboxymethylcellulose Sodium (EYE DROPS OP) Place 1 drop into both eyes daily as needed (moisten eyes).   Yes [provider]  cholecalciferol (VITAMIN D) 1000 units tablet Take 1,000 Units by mouth daily.   Yes [provider]  escitalopram (LEXAPRO) 10 MG tablet TK 1 T PO D 10/15/17  Yes [provider]  HYDROmorphone (DILAUDID) 2 MG tablet Take 1-2 tablets (2-4 mg total) by mouth every 6 (six) hours as needed for severe pain. 01/06/18  Yes Owens Shark, NP  mirtazapine (REMERON) 15 MG tablet TK 1 T PO HS FOR SLEEP AND APPETITE 12/06/17  Yes [provider]  Multiple Vitamins-Minerals (OPTIVITE PO) Take 1 capsule by mouth daily.   Yes [provider]  ondansetron (ZOFRAN) 8 MG tablet  TAKE 1 TABLET(8 MG) BY MOUTH EVERY 8 HOURS AS NEEDED FOR NAUSEA OR VOMITING 12/24/17  Yes Owens Shark, NP  pantoprazole (PROTONIX) 40 MG tablet TAKE 1 TABLET(40 MG) BY MOUTH DAILY 01/10/18  Yes Eugenie Filler, MD  polyethylene glycol powder (MIRALAX) powder Take 17 g by mouth daily. 11/03/17  Yes Palumbo, April, MD  PROAIR HFA 108 (90 Base) MCG/ACT inhaler INHALE 1 PUFF INTO LUNGS EVERY 4 HOURS AS NEEDED 04/14/17  Yes Byrum, Rose Fillers, MD  temazepam (RESTORIL) 15 MG capsule Take 1 capsule (15 mg total) by  mouth at bedtime as needed for sleep. 08/16/17  Yes Hayden Pedro, PA-C  vitamin C (ASCORBIC ACID) 500 MG tablet Take 500 mg by mouth daily.   Yes [provider]  vitamin E 400 UNIT capsule Take 400 Units by mouth daily.   Yes [provider]  loperamide (IMODIUM) 2 MG capsule Take 2 mg by mouth daily as needed for diarrhea or loose stools. 01/12/18   [provider]  prochlorperazine (COMPAZINE) 10 MG tablet Take 4 mg by mouth every 4 (four) hours as needed for nausea/vomiting. 01/12/18   [provider]    Physical Exam: Vitals:   01/26/18 1230 01/26/18 1330 01/26/18 1400 01/26/18 1535  BP: (!) 99/57 122/70 106/65 95/75  Pulse: 84 88 85 86  Resp: 14  16   Temp:      TempSrc:      SpO2: 99% 94% 92% 99%    Constitutional: Chronically ill, in mild to moderate distress due to pain,cachetic Vitals:   01/26/18 1230 01/26/18 1330 01/26/18 1400 01/26/18 1535  BP: (!) 99/57 122/70 106/65 95/75  Pulse: 84 88 85 86  Resp: 14  16   Temp:      TempSrc:      SpO2: 99% 94% 92% 99%   Eyes: PERRL, lids and conjunctivae normal ENMT: Mucous membranes are dry. Posterior pharynx clear of any exudate or lesions.Normal dentition.  Neck: normal, supple, no masses, no thyromegaly Respiratory: Mildly decreased bilateral air entry, no wheezing, no crackles. Normal respiratory effort. No accessory muscle use.  Cardiovascular: Regular rate and rhythm, no murmurs / rubs / gallops. No extremity edema. 2+ pedal pulses. No carotid bruits.  Abdomen: mild generalised tenderness, no masses palpated. No hepatosplenomegaly. Bowel sounds positive.  Musculoskeletal: no clubbing / cyanosis.  Immobilizer/brace on the right lower extremity, decreased range of motion due to pain, normal capillary refill, no ulcers/lacerations Skin: no rashes, lesions, ulcers. No induration Neurologic: CN 2-12 grossly intact. Sensation intact, DTR normal. Strength 5/5 in all 4.  Psychiatric:  Normal judgment and insight. Alert and oriented x 3. Normal mood.   Foley Catheter:None  Labs on Admission: I have personally reviewed following labs and imaging studies  CBC: No results for input(s): WBC, NEUTROABS, HGB, HCT, MCV, PLT in the last 168 hours. Basic Metabolic Panel: No results for input(s): NA, K, CL, CO2, GLUCOSE, BUN, CREATININE, CALCIUM, MG, PHOS in the last 168 hours. GFR: CrCl cannot be calculated (Unknown ideal weight.). Liver Function Tests: No results for input(s): AST, ALT, ALKPHOS, BILITOT, PROT, ALBUMIN in the last 168 hours. No results for input(s): LIPASE, AMYLASE in the last 168 hours. No results for input(s): AMMONIA in the last 168 hours. Coagulation Profile: No results for input(s): INR, PROTIME in the last 168 hours. Cardiac Enzymes: No results for input(s): CKTOTAL, CKMB, CKMBINDEX, TROPONINI in the last 168 hours. BNP (last 3 results) No results for input(s): PROBNP in the last  8760 hours. HbA1C: No results for input(s): HGBA1C in the last 72 hours. CBG: No results for input(s): GLUCAP in the last 168 hours. Lipid Profile: No results for input(s): CHOL, HDL, LDLCALC, TRIG, CHOLHDL, LDLDIRECT in the last 72 hours. Thyroid Function Tests: No results for input(s): TSH, T4TOTAL, FREET4, T3FREE, THYROIDAB in the last 72 hours. Anemia Panel: No results for input(s): VITAMINB12, FOLATE, FERRITIN, TIBC, IRON, RETICCTPCT in the last 72 hours. Urine analysis:    Component Value Date/Time   COLORURINE AMBER (A) 01/08/2018 1738   APPEARANCEUR CLEAR 01/08/2018 1738   LABSPEC 1.011 01/08/2018 1738   PHURINE 7.0 01/08/2018 1738   GLUCOSEU NEGATIVE 01/08/2018 1738   HGBUR NEGATIVE 01/08/2018 1738   BILIRUBINUR NEGATIVE 01/08/2018 1738   KETONESUR NEGATIVE 01/08/2018 1738   PROTEINUR NEGATIVE 01/08/2018 1738   UROBILINOGEN >=8.0 06/25/2017 1636   NITRITE NEGATIVE 01/08/2018 1738   LEUKOCYTESUR NEGATIVE 01/08/2018 1738    Radiological Exams on  Admission: Dg Chest 2 View  Result Date: 01/26/2018 CLINICAL DATA:  Recent fall with shortness of breath EXAM: CHEST - 2 VIEW COMPARISON:  01/08/2018 FINDINGS: Cardiac shadow is mildly enlarged but stable. The previously seen post radiation changes in the left lung are stable. Some increasing density in the left lung base is seen likely related to a new superimposed infiltrate. Large hiatal hernia is again identified. Previously seen nodular changes are not well appreciated. Multilevel compression deformities in the thoracic spine are again seen. Increase in the degree of vascular congestion is noted as well. IMPRESSION: Stable cardiomegaly. Stable hiatal hernia. Significant increase in left basilar infiltrate superimposed over chronic changes in the left mid lung. Vascular congestion is noted as well. Electronically Signed   By: Inez Catalina M.D.   On: 01/26/2018 15:16   Dg Knee Complete 4 Views Right  Result Date: 01/26/2018 CLINICAL DATA:  Right knee pain after fall. EXAM: RIGHT KNEE - COMPLETE 4+ VIEW COMPARISON:  None. FINDINGS: Moderately displaced fracture is seen involving the medial femoral condyle with extension to the supracondylar portion. Intra-articular extension is noted. Fat fluid level is noted in suprapatellar joint space. Moderate degenerative joint disease is noted laterally. IMPRESSION: Moderately displaced fracture is seen involving the medial femoral condyle with intra-articular extension. Electronically Signed   By: Marijo Conception, M.D.   On: 01/26/2018 10:21   Dg Hip Unilat W Or Wo Pelvis 2-3 Views Right  Result Date: 01/26/2018 CLINICAL DATA:  Right hip pain after fall. EXAM: DG HIP (WITH OR WITHOUT PELVIS) 2-3V RIGHT COMPARISON:  None. FINDINGS: Status post right total hip arthroplasty. Vascular calcifications are noted. Probable minimally displaced periprosthetic fracture is noted in the proximal femur. IMPRESSION: Probable minimally displaced periprosthetic fracture is noted in  proximal right fifth Electronically Signed   By: Marijo Conception, M.D.   On: 01/26/2018 10:26   Dg Femur Min 2 Views Right  Result Date: 01/26/2018 CLINICAL DATA:  Right femoral pain after fall. EXAM: RIGHT FEMUR 2 VIEWS COMPARISON:  None. FINDINGS: Status post right total hip arthroplasty. Minimally displaced periprosthetic fracture cannot be excluded. Moderately displaced fracture is seen involving the medial femoral condyle with intra-articular extension. Fat fluid level is noted in suprapatellar joint space. Vascular calcifications are noted. IMPRESSION: Moderately displaced medial femoral condyle fracture with intra-articular extension. Status post right total hip arthroplasty; minimally displaced periprosthetic fracture involving proximal femur cannot be excluded. Dedicated radiographs of the right hip are recommended. Electronically Signed   By: Marijo Conception, M.D.   On: 01/26/2018  10:25     Assessment/Plan Principal Problem:   Femur fracture, right (HCC) Active Problems:   Metastasis from pancreatic cancer ([place])   HCAP (healthcare-associated pneumonia)   Right femoral condyle fracture: X-ray findings described as above.  Orthopedics was consulted.  Family do not want to pursue any treatment.  So recommended immobilization and nonweightbearing only. Patient is on hospice care at home.  We will continue supportive care. PT/OT consultation.  Nonweightbearing. Pain management.  Anemia: Most likely associated with malignancy.  Continue to monitor H&H.  Transfuse if needed and agreed by family.  Left lower lobe pneumonia: She was just admitted and discharged from here on 01/10/18 for treatment of community-acquired pneumonia on the left lower lobe.  Patient was found to be hypoxic on presentation.Chest Xray showed Significant increase in left basilar infiltrate superimposed over chronic changes in the left mid lung.  Started on vancomycin and cefepime.  COPD: Stable.  Not on home oxygen.   Continue bronchodilators and supplemental oxygen.  Adenocarcinoma of the pancreatic head/unicnate: Follows with Dr. Benay Spice as an outpatient.  Diagnosed on November 2018.  On hospice care at home.  Not on chemotherapy.  Will request for case manager evaluation for facilitating on discharge to hospice at home when ready.    Severity of Illness: The appropriate patient status for this patient is INPATIENT.    DVT prophylaxis:  Lovenox Code Status: Full Family Communication: Family members present at the bedside Consults called: Orthopedics called by ED    Shelly Coss MD [facility] Pager [phone]  If 7PM-7AM, please contact night-coverage [email or web address] Password [facility]  01/26/2018, 4:25 PM

## 2018-01-26 NOTE — ED Notes (Signed)
Cultures not obtain nor blood work till later in day.

## 2018-01-26 NOTE — Progress Notes (Signed)
Pharmacy Antibiotic Note  Carol Cook is a 83 y.o. female admitted on 01/26/2018 with pneumonia.  Patient was recently admitted for pneumonia, treated with ceftriaxone/azithromycin and transitioned to augmentin. Pharmacy has been consulted for vancomycin and cefepime dosing.  Plan: Cefepime 1g IV q8h Vancomycin 1250mg  IV x 1 given in ED, continue with 750mg  IV q12h for estimated AUC 486 Check vancomycin levels at steady state, goal AUC 400-500 Follow up renal function & cultures    Temp (24hrs), Avg:98.4 F (36.9 C), Min:98.4 F (36.9 C), Max:98.4 F (36.9 C)  No results for input(s): WBC, CREATININE, LATICACIDVEN, VANCOTROUGH, VANCOPEAK, VANCORANDOM, GENTTROUGH, GENTPEAK, GENTRANDOM, TOBRATROUGH, TOBRAPEAK, TOBRARND, AMIKACINPEAK, AMIKACINTROU, AMIKACIN in the last 168 hours.  CrCl cannot be calculated (Unknown ideal weight.).    No Known Allergies  Antimicrobials this admission:  6/5 Vanc >> 6/5 Cefepime >>  Dose adjustments this admission:    Microbiology results:  6/5 BCx: 6/5 UCx:  Thank you for allowing pharmacy to be a part of this patient's care.  Peggyann Juba, PharmD, BCPS Pager: [phone] 01/26/2018 4:43 PM

## 2018-01-26 NOTE — Progress Notes (Signed)
A consult was received from an ED physician for vancomycin per pharmacy dosing.  The patient's profile has been reviewed for ht/wt/allergies/indication/available labs.   A one time order has been placed for vancomycin 1250mg .    Further antibiotics/pharmacy consults should be ordered by admitting physician if indicated.                       Thank you, Peggyann Juba, PharmD, BCPS 01/26/2018  3:24 PM

## 2018-01-26 NOTE — ED Notes (Signed)
Patient transported to X-ray 

## 2018-01-26 NOTE — ED Triage Notes (Signed)
Per EMS this is a hospice pt, with hx of pancreatic cancer, coming form home with c/o fall and right knee pain. Pt reports going to bathroom (uses walker to ambulate, but was in a hurry and didn't use it this morning), didn't quite make it to the toilet, and slipped on urine and fell hitting her knee. Pt initially didn't want to come in but her family convinced her. Pt sts she really wants to go back either home or to hospice.

## 2018-01-27 DIAGNOSIS — R52 Pain, unspecified: Secondary | ICD-10-CM

## 2018-01-27 DIAGNOSIS — L8990 Pressure ulcer of unspecified site, unspecified stage: Secondary | ICD-10-CM

## 2018-01-27 LAB — CBC
HEMATOCRIT: 23.1 % — AB (ref 36.0–46.0)
Hemoglobin: 7.5 g/dL — ABNORMAL LOW (ref 12.0–15.0)
MCH: 29.8 pg (ref 26.0–34.0)
MCHC: 32.5 g/dL (ref 30.0–36.0)
MCV: 91.7 fL (ref 78.0–100.0)
PLATELETS: 230 10*3/uL (ref 150–400)
RBC: 2.52 MIL/uL — ABNORMAL LOW (ref 3.87–5.11)
RDW: 17.7 % — AB (ref 11.5–15.5)
WBC: 13.4 10*3/uL — ABNORMAL HIGH (ref 4.0–10.5)

## 2018-01-27 LAB — BASIC METABOLIC PANEL
Anion gap: 7 (ref 5–15)
BUN: 16 mg/dL (ref 6–20)
CO2: 25 mmol/L (ref 22–32)
CREATININE: 0.62 mg/dL (ref 0.44–1.00)
Calcium: 8.8 mg/dL — ABNORMAL LOW (ref 8.9–10.3)
Chloride: 101 mmol/L (ref 101–111)
GFR calc Af Amer: >60 mL/min (ref >60)
GFR calc non Af Amer: >60 mL/min (ref >60)
GLUCOSE: 132 mg/dL — AB (ref 65–99)
Potassium: 3.9 mmol/L (ref 3.5–5.1)
SODIUM: 133 mmol/L — AB (ref 135–145)

## 2018-01-27 LAB — MRSA PCR SCREENING: MRSA by PCR: NEGATIVE

## 2018-01-27 MED ORDER — SODIUM CHLORIDE 0.9 % IV SOLN
8.0000 mg | Freq: Four times a day (QID) | INTRAVENOUS | Status: DC | PRN
  Filled 2018-01-27: qty 4

## 2018-01-27 MED ORDER — IPRATROPIUM-ALBUTEROL 0.5-2.5 (3) MG/3ML IN SOLN
3.0000 mL | Freq: Two times a day (BID) | RESPIRATORY_TRACT | Status: DC
  Administered 2018-01-27 – 2018-01-28 (×3): 3 mL via RESPIRATORY_TRACT
  Filled 2018-01-27 (×4): qty 3

## 2018-01-27 MED ORDER — PROCHLORPERAZINE EDISYLATE 10 MG/2ML IJ SOLN
5.0000 mg | Freq: Four times a day (QID) | INTRAMUSCULAR | Status: DC | PRN
  Administered 2018-01-27: 5 mg via INTRAVENOUS
  Filled 2018-01-27: qty 2

## 2018-01-27 MED ORDER — HYDROMORPHONE HCL 1 MG/ML IJ SOLN
0.5000 mg | INTRAMUSCULAR | Status: DC | PRN
  Administered 2018-01-27 – 2018-01-28 (×2): 0.5 mg via INTRAVENOUS
  Filled 2018-01-27 (×2): qty 0.5

## 2018-01-27 MED ORDER — IPRATROPIUM-ALBUTEROL 0.5-2.5 (3) MG/3ML IN SOLN
3.0000 mL | Freq: Three times a day (TID) | RESPIRATORY_TRACT | Status: DC
  Administered 2018-01-27: 3 mL via RESPIRATORY_TRACT
  Filled 2018-01-27: qty 3

## 2018-01-27 MED ORDER — SODIUM CHLORIDE 0.9 % IV SOLN
1.0000 g | Freq: Two times a day (BID) | INTRAVENOUS | Status: DC
  Administered 2018-01-27 – 2018-01-28 (×2): 1 g via INTRAVENOUS
  Filled 2018-01-27 (×2): qty 1

## 2018-01-27 MED ORDER — HYDROMORPHONE HCL 2 MG PO TABS
2.0000 mg | ORAL_TABLET | Freq: Four times a day (QID) | ORAL | Status: DC | PRN
  Administered 2018-01-27 – 2018-01-28 (×3): 2 mg via ORAL
  Filled 2018-01-27 (×3): qty 1

## 2018-01-27 MED ORDER — POLYETHYLENE GLYCOL 3350 17 G PO PACK
17.0000 g | PACK | Freq: Every day | ORAL | Status: DC
  Filled 2018-01-27 (×2): qty 1

## 2018-01-27 NOTE — Care Management Note (Signed)
Case Management Note  Patient Details  Name: Carol Cook MRN: [id number] Date of Birth: [birth year]  Subjective/Objective: DNR.Active w/[facility] for transport @ d/c.Noted no aggressive treatment for femur fx-NWB, pain control.                   Action/Plan:d/c plan home w/hospice   Expected Discharge Date:  (unknown)               Expected Discharge Plan:  Home w Hospice Care  In-House Referral:     Discharge planning Services  CM Consult  Post Acute Care Choice:  Hospice(Active w/[facility]) Choice offered to:     DME Arranged:    DME Agency:     HH Arranged:    HH Agency:     Status of Service:  In process, will continue to follow  If discussed at Long Length of Stay Meetings, dates discussed:    Additional Comments:  Dessa Phi, RN 01/27/2018, 7:47 AM

## 2018-01-27 NOTE — Progress Notes (Signed)
Nutrition Brief Note  Patient identified on the Malnutrition Screening Tool (MST) Report with score of 2.0 indicating patient, or respondent, unsure of weight changes.   Wt Readings from Last 15 Encounters:  01/26/18 144 lb 2.9 oz (65.4 kg)  01/08/18 143 lb (64.9 kg)  01/06/18 143 lb 9.6 oz (65.1 kg)  12/28/17 148 lb 3.2 oz (67.2 kg)  12/15/17 151 lb 8 oz (68.7 kg)  11/30/17 151 lb (68.5 kg)  11/02/17 155 lb (70.3 kg)  09/28/17 156 lb 12.8 oz (71.1 kg)  09/20/17 157 lb (71.2 kg)  08/30/17 158 lb 9.6 oz (71.9 kg)  07/20/17 159 lb 9.6 oz (72.4 kg)  07/13/17 161 lb 11.2 oz (73.3 kg)  06/26/17 164 lb 14.5 oz (74.8 kg)  05/17/17 166 lb (75.3 kg)  12/17/16 179 lb 3.2 oz (81.3 kg)    Body mass index is 26.37 kg/m. Patient meets criteria for overweight (appropriate for advanced age) based on current BMI. Per chart review, pt has lost 7 lbs (4.6% body weight) in the past ~1.5 months. This is not significant for time frame. Stage 2 R IT and Stage 1 coccyx pressure injuries.   Patient with hx of advanced pancreatic cancer and was followed by hospice at home. She experienced a fall with subsequent R femur fx. Plan is for non-surgical management of fracture and notes indicate that patient will go home with hospice with a hospital bed and plans to remain at that level. Patient seen by Hospice this AM and note states that patient wants to go home as soon as possible. Dr. Hadley Pen note from this AM states plan for home with hospice once patient is stable on PO medications.   Current diet order is Heart Healthy; Dr. Bonner Puna gives verbal order to RD to liberalize to Regular diet. Medications reviewed; 1 packet Miralax/day, 40 mg oral Protonix/day. Labs reviewed; Na: 133 mmol/L, Ca: 8.8 mg/dL. IVF: NS @ 75 mL/hr.   No nutrition interventions warranted at this time. If nutrition issues arise, please consult RD.      Jarome Matin, MS, RD, LDN, [facility] Inpatient Clinical Dietitian Pager #  [phone] After hours/weekend pager # [phone]

## 2018-01-27 NOTE — Progress Notes (Signed)
PT Cancellation Note  Patient Details Name: Carol Cook MRN: [id number] DOB: February 24, 1927   Cancelled Treatment:    Reason Eval/Treat Not Completed: Other (comment) Pt with increased pain and nausea this morning.  Spoke with RN, she reports pt will likely return home with hospice services.  RN plans to speak with family regarding need for PT as pt likely to go home with hospital bed and is NWB.     Keola Heninger,KATHrine E 01/27/2018, 1:18 PM Carmelia Bake, PT, DPT 01/27/2018 Pager: [phone]

## 2018-01-27 NOTE — Progress Notes (Signed)
Pt has not voided all shift. Bladder scanner reveals 423 ml. On call provider made aware. New order for in and out cath. Will carry out intervention and continue to monitor closely.

## 2018-01-27 NOTE — Progress Notes (Signed)
PROGRESS NOTE  Carol Cook  LKG:[id number] DOB: 30-Apr-1927 DOA: 01/26/2018 PCP: Velna Hatchet, MD   Brief Narrative: Carol Cook is an 84 y.o. female with a history of advanced pancreatic cancer followed by hospice at home who presented to the ED after a fall at home with resultant right femur fracture. She has a history of COPD and was hypoxic on admission which is new. CXR demonstrated left base infiltrate which may be new vs. resolution from recently treated pneumonia. The patient and family did not wish to pursue surgical treatment, so she was admitted for symptom management, requiring IV pain medications consistently.   Assessment & Plan: Principal Problem:   Femur fracture, right (HCC) Active Problems:   Primary cancer of head of pancreas ([place])   HCAP (healthcare-associated pneumonia)   Anemia  Closed right femur medial condyle fracture due to fall at home:  - Nonoperative management per pt and family.  - Keep immobilized at the knee and NWB. Will need hospital bed at home. Do not feel PT/OT consult is necessary.  - Severe pain: has required several IV doses of narcotics. No sedation noted. Having some nausea that may be from pain vs. morphine. Will DC morphine and give dilaudid 2mg  prn moderate - 4mg  prn severe pain. If pain breakthrough, give IV dilaudid. If stable on po regimen, can be discharged back home with hospice care. Continue to manage/prevent constipation. - IV zofran prn nausea, will push po medications in hopes of returning home sooner than later.  Metastatic pancreatic adenocarcinoma:  - Follows with Dr. Benay Spice, decided to discontinue therapy at previous hospitalization, now on hospice care.  Hypoxia, left base infiltrate on CXR, leukocytosis: Infiltrate on my personal review of CXR has advanced since previous which was done on admission for PNA 5/18. This may be due to worsening/Tx failure (received CTX/azithro, DC'ed on total of 7 days therapy w/augmentin) or just  radiographic evolution.  - Will DC vancomycin given this is not consistent with fulminant MRSA pneumonia. Continue cefepime for now for HCAP.  - Continue IVF's while inpatient for now.  Acute urinary retention: due to pain vs. opioids.  - Monitor UOP closely, bladder scan as needed and I/O cath if retaining >300cc. Pt wishes to not have foley placed, though this is appropriate given her hospice status if she changes her mind. D/w RN.   COPD: No evidence of wheezing/exacerbation.  - Continue home medications  Stage I pressure injury of coccyx:  - Offload as able, supportive measures  DVT prophylaxis: Lovenox Code Status: DNR Family Communication: None at bedside. Disposition Plan: Home with hospice once stable on po medications.  Consultants:   [facility]  Procedures:   None  Antimicrobials:  Vancomycin 6/5   Cefepime 6/5 >>    Subjective: Nauseated, pain improved with IV medications overnight. Has not eaten anything and had some issues with urinary retention. Does not think she wants a foley catheter.   Objective: Vitals:   01/26/18 2100 01/26/18 2108 01/27/18 0530 01/27/18 0929  BP: 101/79  106/65   Pulse: (!) 107  79 80  Resp: 18  18   Temp: 99.7 F (37.6 C)  100.2 F (37.9 C)   TempSrc: Oral  Oral   SpO2: 94% 94% 99% 100%  Weight:      Height:        Intake/Output Summary (Last 24 hours) at 01/27/2018 1057 Last data filed at 01/27/2018 0600 Gross per 24 hour  Intake 1192.5 ml  Output 500 ml  Net 692.5  ml   Filed Weights   01/26/18 1852  Weight: 65.4 kg (144 lb 2.9 oz)    Gen: Tired-appearing elderly female in evident pain Pulm: Non-labored breathing supplemental oxygen. Clear to auscultation anteriorly bilaterally.  CV: Regular rate and rhythm. No murmur, rub, or gallop. No JVD, no pedal edema. GI: Abdomen soft, non-tender, non-distended, with normoactive bowel sounds. No organomegaly or masses felt. Ext: Tenderness to palpation at right hip. Knee in  immobilizer, distal cap refill brisk, sensation and strength normal.  Skin: Stage I coccyx pressure injury Neuro: Alert and oriented. No focal neurological deficits. Psych: Judgement and insight appear normal. Mood & affect appropriate.   Data Reviewed: I have personally reviewed following labs and imaging studies  CBC: Recent Labs  Lab 01/26/18 1736 01/27/18 0454  WBC 12.6* 13.4*  NEUTROABS 11.6*  --   HGB 8.7* 7.5*  HCT 26.6* 23.1*  MCV 91.4 91.7  PLT 311 671   Basic Metabolic Panel: Recent Labs  Lab 01/26/18 1736 01/27/18 0454  NA 134* 133*  K 4.2 3.9  CL 101 101  CO2 26 25  GLUCOSE 131* 132*  BUN 15 16  CREATININE 0.64 0.62  CALCIUM 9.3 8.8*   GFR: Estimated Creatinine Clearance: 42.3 mL/min (by C-G formula based on SCr of 0.62 mg/dL). Liver Function Tests: Recent Labs  Lab 01/26/18 1736  AST 30  ALT 22  ALKPHOS 385*  BILITOT 1.4*  PROT 5.8*  ALBUMIN 3.0*   No results for input(s): LIPASE, AMYLASE in the last 168 hours. No results for input(s): AMMONIA in the last 168 hours. Coagulation Profile: Recent Labs  Lab 01/26/18 1736  INR 1.17   Cardiac Enzymes: No results for input(s): CKTOTAL, CKMB, CKMBINDEX, TROPONINI in the last 168 hours. BNP (last 3 results) No results for input(s): PROBNP in the last 8760 hours. HbA1C: No results for input(s): HGBA1C in the last 72 hours. CBG: No results for input(s): GLUCAP in the last 168 hours. Lipid Profile: No results for input(s): CHOL, HDL, LDLCALC, TRIG, CHOLHDL, LDLDIRECT in the last 72 hours. Thyroid Function Tests: No results for input(s): TSH, T4TOTAL, FREET4, T3FREE, THYROIDAB in the last 72 hours. Anemia Panel: No results for input(s): VITAMINB12, FOLATE, FERRITIN, TIBC, IRON, RETICCTPCT in the last 72 hours. Urine analysis:    Component Value Date/Time   COLORURINE AMBER (A) 01/26/2018 1736   APPEARANCEUR CLEAR 01/26/2018 1736   LABSPEC 1.020 01/26/2018 1736   PHURINE 5.0 01/26/2018 1736    GLUCOSEU NEGATIVE 01/26/2018 1736   HGBUR NEGATIVE 01/26/2018 1736   BILIRUBINUR NEGATIVE 01/26/2018 1736   KETONESUR 5 (A) 01/26/2018 1736   PROTEINUR 30 (A) 01/26/2018 1736   UROBILINOGEN >=8.0 06/25/2017 1636   NITRITE NEGATIVE 01/26/2018 1736   LEUKOCYTESUR NEGATIVE 01/26/2018 1736   No results found for this or any previous visit (from the past 240 hour(s)).    Radiology Studies: Dg Chest 2 View  Result Date: 01/26/2018 CLINICAL DATA:  Recent fall with shortness of breath EXAM: CHEST - 2 VIEW COMPARISON:  01/08/2018 FINDINGS: Cardiac shadow is mildly enlarged but stable. The previously seen post radiation changes in the left lung are stable. Some increasing density in the left lung base is seen likely related to a new superimposed infiltrate. Large hiatal hernia is again identified. Previously seen nodular changes are not well appreciated. Multilevel compression deformities in the thoracic spine are again seen. Increase in the degree of vascular congestion is noted as well. IMPRESSION: Stable cardiomegaly. Stable hiatal hernia. Significant increase in left basilar  infiltrate superimposed over chronic changes in the left mid lung. Vascular congestion is noted as well. Electronically Signed   By: Inez Catalina M.D.   On: 01/26/2018 15:16   Dg Knee Complete 4 Views Right  Result Date: 01/26/2018 CLINICAL DATA:  Right knee pain after fall. EXAM: RIGHT KNEE - COMPLETE 4+ VIEW COMPARISON:  None. FINDINGS: Moderately displaced fracture is seen involving the medial femoral condyle with extension to the supracondylar portion. Intra-articular extension is noted. Fat fluid level is noted in suprapatellar joint space. Moderate degenerative joint disease is noted laterally. IMPRESSION: Moderately displaced fracture is seen involving the medial femoral condyle with intra-articular extension. Electronically Signed   By: Marijo Conception, M.D.   On: 01/26/2018 10:21   Dg Hip Unilat W Or Wo Pelvis 2-3 Views  Right  Result Date: 01/26/2018 CLINICAL DATA:  Right hip pain after fall. EXAM: DG HIP (WITH OR WITHOUT PELVIS) 2-3V RIGHT COMPARISON:  None. FINDINGS: Status post right total hip arthroplasty. Vascular calcifications are noted. Probable minimally displaced periprosthetic fracture is noted in the proximal femur. IMPRESSION: Probable minimally displaced periprosthetic fracture is noted in proximal right fifth Electronically Signed   By: Marijo Conception, M.D.   On: 01/26/2018 10:26   Dg Femur Min 2 Views Right  Result Date: 01/26/2018 CLINICAL DATA:  Right femoral pain after fall. EXAM: RIGHT FEMUR 2 VIEWS COMPARISON:  None. FINDINGS: Status post right total hip arthroplasty. Minimally displaced periprosthetic fracture cannot be excluded. Moderately displaced fracture is seen involving the medial femoral condyle with intra-articular extension. Fat fluid level is noted in suprapatellar joint space. Vascular calcifications are noted. IMPRESSION: Moderately displaced medial femoral condyle fracture with intra-articular extension. Status post right total hip arthroplasty; minimally displaced periprosthetic fracture involving proximal femur cannot be excluded. Dedicated radiographs of the right hip are recommended. Electronically Signed   By: Marijo Conception, M.D.   On: 01/26/2018 10:25    Scheduled Meds: . enoxaparin (LOVENOX) injection  40 mg Subcutaneous Q24H  . fentaNYL (SUBLIMAZE) injection  50 mcg Intravenous Once  . ipratropium-albuterol  3 mL Nebulization BID  . pantoprazole  40 mg Oral Daily  . polyethylene glycol  17 g Oral Daily   Continuous Infusions: . sodium chloride 75 mL/hr at 01/27/18 0700     LOS: 1 day   Time spent: 35 minutes.  Patrecia Pour, MD [facility] [email or web address] Password [email or web address] 01/27/2018, 10:57 AM

## 2018-01-27 NOTE — Progress Notes (Signed)
Patient has not voided very much if any today. It is difficult to assess as she has had a few loose stools. RN has bladder scanned the patient twice. First time at about 1030 and found only 60cc in bladder. Second time was at 1500 and only found 120 cc in the bladder. Patient still declining a foley catheter for comfort and does not meet criteria for I&O cath. Will continue to monitor. Reported off to next RN that the MD has given a verbal order that a foley catheter can be placed for comfort measures if the patient wishes.   Othella Boyer [facility] 01/27/2018 4:11 PM

## 2018-01-27 NOTE — Progress Notes (Signed)
[facility] Work note LCSW met with patient son, Herbie Baltimore and daughter-in-law, Lelon Frohlich to discuss discharge planning needs and concerns. They share a concern with taking patient home with resources in place. We discussed options- home vs [employer]. LCSW offered education about facility, costs and family addressed pros/cons of both options. Family to discuss further and LCSW communicated plan with RN liaison, Judeen Hammans. RN, Judeen Hammans to consult hospice physician for evaluation and approval for criteria. Support, education offered to family.  Katherina Right, [place]

## 2018-01-27 NOTE — Progress Notes (Signed)
[facility]) RN visit  This is a related and covered GIP admission of 01/26/18 with [facility] diagnosis of Malignant neoplasm of Pancreas per Dr. Karie Georges. Patient has an [place] DNR. Family activated EMS after a fall in the home in which [facility] was notified. Upon arrival to ER, patient was Hypoxic with complaints of right knee, hip and leg pain.  Patient was admitted to hospital for pain management of a fracture of right femur and treatment of pneumonia.   [facility] GIP LOS: Day 2  Spoke with bedside RN, Janett Billow, who stated patient had an uneventful night and was just medicated with Morphine for discomfort. Visited patient at bedside who was lethargic and oriented, slightly SOB with HOB at 40 degrees with O2 on 2L via [place]. Patient complains of "slight discomfort" in right LE and nausea stating that she has been unable to tolerate food since admission.  Patient also verbalized desire to go home as soon as possible.  Writer listened and supported patient and encouraged to rest and call for assistance as needed; left [facility] contact information at bedside.  Notified bedside RN of patient complaints.  Patient currently on following medications: Lovenox inj 40mg  q 24hrs, Duoneb 0.5-2.5 (3) mg/3ML nebulizer BID, 0.9% NS IV at 68ml/hr with prn Morphine 2mg  IV adm at 0339 and 0855 on 01/27/18  and Zofran 4mg  IV adm at 1021 01/27/18. Orthopedics and PT/OT have been consulted and patient/family do not wish to pursue any treatment at this time just comfort care. Per MD notes, recommended immobilization and patient to remain nonweightbearing at this time.  Discharge Planning: Patient desires to go home as soon as possible. Family verbalized concerns about home care needs with current patient condition. [facility] SW plans to visit patient this afternoon to support family/patient with discharge needs. Hospital Bed and Over the bedside table has been ordered for the home.    Communication to [facility]:  Hospice will continue to follow daily during admission, contact information given to patient.  Discussed current POC with daughter Pamala Hurry ([facility]) and encouraged to call for assistance as needed.   Communication to [facility]G: [facility] SW, [facility] MD and [facility] RN updated on patient hospital admission and condition.  Notified Primary Care Physician, Dr. Ardeth Perfect of patient hospital admission.   [facility] transfer summary and current medication list placed on shadow chart.  Please call with any hospice related questions.  Thank you,   Gar Ponto, [facility] Liaison [place] are now on AMION

## 2018-01-27 NOTE — Progress Notes (Signed)
Pharmacy Antibiotic Note  Carol Cook is a 84 y.o. female admitted on 01/26/2018 with pneumonia.  Patient was recently admitted for pneumonia, treated with ceftriaxone/azithromycin and transitioned to augmentin. Pharmacy has been consulted for vancomycin and cefepime dosing. 01/27/2018:  Tm 100.43F  WBC increased- 13.4  Renal function at patient's baseline.  CrCl ~40-4ml/min  CXR + worsened infiltrate  Plan: Cefepime 1g IV q12h for CrCl 30-42ml/min Follow up renal function & cultures  Height: 5\' 2"  (157.5 cm) Weight: 144 lb 2.9 oz (65.4 kg) IBW/kg (Calculated) : 50.1  Temp (24hrs), Avg:99.6 F (37.6 C), Min:98.9 F (37.2 C), Max:100.2 F (37.9 C)  Recent Labs  Lab 01/26/18 1736 01/27/18 0454  WBC 12.6* 13.4*  CREATININE 0.64 0.62    Estimated Creatinine Clearance: 42.3 mL/min (by C-G formula based on SCr of 0.62 mg/dL).    No Known Allergies  Antimicrobials this admission:  6/5 Vanc >>6/6 6/5 Cefepime >>  Dose adjustments this admission:    Microbiology results:  6/5 BCx: 6/5 UCx: 6/6 MRSA PCR:  Thank you for allowing pharmacy to be a part of this patient's care.  Netta Cedars, PharmD, BCPS Pager: [phone] 01/27/2018 11:47 AM

## 2018-01-27 NOTE — Progress Notes (Signed)
OT Cancellation Note  Patient Details Name: Carol Cook MRN: [id number] DOB: Sep 21, 1926   Cancelled Treatment:    Reason Eval/Treat Not Completed: Other (comment).  Spoke to [employer].  Likely no need for OT. Family plans to take pt home with hospital bed and stay at that level.  She is followed by hospice.  Deandrew Hoecker 01/27/2018, 11:05 AM  Lesle Chris, OTR/L [phone] 01/27/2018

## 2018-01-27 NOTE — Progress Notes (Signed)
PT Cancellation Note  Patient Details Name: Carol Cook MRN: [id number] DOB: [birth year]   Cancelled Treatment:    Reason Eval/Treat Not Completed: PT screened, no needs identified, will sign off RN reports that family wishes for comfort at this time and declines PT/OT. PT and OT to sign off.   Jowan Skillin,KATHrine E 01/27/2018, 4:21 PM Carmelia Bake, PT, DPT 01/27/2018 Pager: [phone]

## 2018-01-28 LAB — URINE CULTURE: CULTURE: NO GROWTH

## 2018-01-28 MED ORDER — CEFDINIR 250 MG/5ML PO SUSR: 300.0000 mg | Freq: Two times a day (BID) | ORAL | 0 refills | Status: DC

## 2018-01-28 MED ORDER — ALUM & MAG HYDROXIDE-SIMETH 200-200-20 MG/5ML PO SUSP
30.0000 mL | Freq: Four times a day (QID) | ORAL | Status: DC | PRN
  Administered 2018-01-28: 30 mL via ORAL
  Filled 2018-01-28: qty 30

## 2018-01-28 MED ORDER — HYDROMORPHONE HCL 2 MG PO TABS: 2.0000 mg | ORAL_TABLET | Freq: Four times a day (QID) | ORAL | 0 refills | Status: AC | PRN

## 2018-01-28 MED ORDER — CEFDINIR 250 MG/5ML PO SUSR
300.0000 mg | Freq: Two times a day (BID) | ORAL | Status: DC
  Filled 2018-01-28: qty 6

## 2018-01-28 MED ORDER — ONDANSETRON HCL 4 MG/2ML IJ SOLN: 4.0000 mg | Freq: Four times a day (QID) | INTRAMUSCULAR | Status: DC | PRN

## 2018-01-28 MED ORDER — TEMAZEPAM 15 MG PO CAPS
15.0000 mg | ORAL_CAPSULE | Freq: Every evening | ORAL | 2 refills | Status: AC | PRN
Start: 2018-01-28 — End: ?

## 2018-01-28 NOTE — Progress Notes (Signed)
Patient able to have urine output at 0215 Patient incontinent on stool and urine so RN unable to measured the output. Will continue to monitor.

## 2018-01-28 NOTE — Progress Notes (Addendum)
[facility]) RN visit  This is a related and covered GIP admission of 01/26/18 with [facility] diagnosis of Malignant neoplasm of Pancreas per Dr. Karie Georges. Patient has an [place] DNR. Family activated EMS after a fall in the home in which [facility] was notified. Upon arrival to ER, patient was Hypoxic with complaints of right knee, hip and leg pain.  Patient was admitted to hospital for pain management of a fracture of right femur and treatment of pneumonia.   [facility] GIP LOS: Day 3  Spoke with patient, daughter Pamala Hurry and son Herbie Baltimore at bedside. Patient does not appear in distress and does not complain of pain during this visit.  She is on O2 2Lnc SpO2 97%.   Medications: Cefdinir 300mg  PO BID, Lovenox 40mg  [place] QD, Duoneb 0.5/2.5mg  nebulizer BID, Protonix 40mg  PO QD. Continuous meds: NS @ 45ml/hr. PRN meds: Maalox/mylanta 200-200-20mg  Q6hrs at 1320. Dilaudid 05mg /IV  Q4hrs at 0840, Dilaudid 2mg /PO at 0220  Discharge Planning: Patient desires to go home as soon as possible. Family verbalized concerns about home care needs with current patient condition. Family is interested in transferring to [facility]. Current plan is to go home 6/8 with additional DME, hospital be, OBT and oxygen.   Communication to [facility]: Hospice will continue to follow daily during admission, contact information given to patient.  Discussed current POC with daughter Pamala Hurry ([facility]) and son Herbie Baltimore. Encouraged to call for assistance as needed.   Communication to [facility]: [facility] SW, [facility] MD and [facility] RN updated on patient hospital admission and condition.  Notified Primary Care Physician, Dr. Ardeth Perfect of patient hospital admission.   [facility] transfer summary and current medication list placed on shadow chart.  Please call with any hospice related questions. Please call [facility] for transport home at discharge.  Thank you,  Farrel Gordon, RN, [facility] Liaison [phone]                        Signed

## 2018-01-28 NOTE — Progress Notes (Signed)
Patient is having increased loose stools. Pt has had 8 bowel movements, the last 3 have been loose. Will place on contact precautions and update provider.

## 2018-01-28 NOTE — Progress Notes (Signed)
CSW consult -Residential Hops[facility] Patient and adult children have decided to go home with [facility] services.  CSW signing off.   Kathrin Greathouse, Latanya Presser, MSW Clinical Social Worker  [phone] 01/28/2018  12:37 PM

## 2018-01-28 NOTE — Progress Notes (Signed)
CM has left vm w/son Robert-c#[phone] for call back-discuss d/c plans-if family prefers back home w/hospice([facility]) or residential hospice-await call back.

## 2018-01-28 NOTE — Care Management Note (Signed)
Case Management Note  Patient Details  Name: Carol Cook MRN: [id number] Date of Birth: 1926-12-19  Subjective/Objective: Per patient request agreed(talk to dtr/son outside of rm)  Spoke to [employer]) about d/c plans-they had concerns about being able to care for patient with higher requirement of needs, & equipment needed @ home-home 02,& hospital bed. Went into rm to discuss w/patient what she wanted-patient wants to go home, she doesn't want to go to a facility. Judeth Cornfield, & also Robert by phone all agree to home w/[facility] services-they will get home ready to prepare for patient. Currently an unsafe d/c today since family need time to make arrangements in home to prepare for patient.Already active w/[facility]-rep Maryanne will come to see patient/family to discuss futher services that can be provided & additional dme needed. Will transport home by [facility].                  Action/Plan:d/c home w/[facility]/DME/[facility]   Expected Discharge Date:  01/28/18               Expected Discharge Plan:  Home w Hospice Care  In-House Referral:     Discharge planning Services  CM Consult  Post Acute Care Choice:  Hospice(Active w/[facility]) Choice offered to:  Adult Children  DME Arranged:    DME Agency:     HH Arranged:    HH Agency:     Status of Service:  In process, will continue to follow  If discussed at Long Length of Stay Meetings, dates discussed:    Additional Comments:  Dessa Phi, RN 01/28/2018, 12:27 PM

## 2018-01-28 NOTE — Progress Notes (Addendum)
PROGRESS NOTE  Carol Cook  PIR:[id number] DOB: [birth year] DOA: 01/26/2018 PCP: Velna Hatchet, MD   Brief Narrative: Carol Cook is an 84 y.o. female with a history of advanced pancreatic cancer followed by hospice at home who presented to the ED after a fall at home with resultant right femur fracture. She has a history of COPD and was hypoxic on admission which is new. CXR demonstrated left base infiltrate which may be new vs. resolution from recently treated pneumonia. The patient and family did not wish to pursue surgical treatment, so she was admitted for symptom management, requiring IV pain medications consistently.   Assessment & Plan: Principal Problem:   Femur fracture, right (HCC) Active Problems:   Primary cancer of head of pancreas ([place])   HCAP (healthcare-associated pneumonia)   Anemia   Pressure injury of skin  Closed right femur medial condyle fracture due to fall at home:  - Nonoperative management per pt and family.  - Keep immobilized at the knee and NWB. Will need hospital bed if patient goes home. Do not feel PT/OT consult is necessary. However the family hesitant to take the patient home and would perhaps want SNF - initially had Severe pain: has required several IV doses of narcotics. No sedation noted. Having some nausea that may be from pain vs. morphine.  Discontinued morphine and now on low-dose dilaudid  .  Continue to manage/prevent constipation. - IV zofran prn nausea, will push po medications in hopes of returning home sooner than later.  Metastatic pancreatic adenocarcinoma:  - Follows with Dr. Benay Spice, decided to discontinue therapy at previous hospitalization, now on hospice care at home.  Hypoxia, left base infiltrate on CXR, leukocytosis: Infiltrate on my personal review of CXR has advanced since previous which was done on admission for PNA 5/18. This may be due to worsening/Tx failure (received CTX/azithro, DC'ed on total of 7 days therapy w/augmentin)  or just radiographic evolution.  -  Treated with vancomycin /cefepime   for possible hospital-acquired pneumonia. Now on Omnicef twice a day for another 4 days - Continue IVF's while inpatient for now.  Acute urinary retention: due to pain vs. opioids.  - Monitor UOP closely, bladder scan as needed and I/O cath if retaining >300cc. Pt wishes to not have foley placed, though this is appropriate given her hospice status if she changes her mind. After urinary retention all night, Foley was placed 6/7  Diarrhea Patient endorses multiple episodes of diarrhea today Will check for C. Difficile if diarrhea continues Check BMP tomorrow and continue IV hydration  COPD: No evidence of wheezing/exacerbation.  - Continue home medications  Stage I pressure injury of coccyx:  - Offload as able, supportive measures  DVT prophylaxis: Lovenox Code Status: DNR Family Communication: None at bedside. Disposition Plan: Home with hospice vs [facility] , , daughter yet to decide  Consultants:   [facility]  Procedures:   None  Antimicrobials: Anti-infectives (From admission, onward)   Start     Dose/Rate Route Frequency Ordered Stop   01/29/18 1000  cefdinir (OMNICEF) 250 MG/5ML suspension 300 mg     300 mg Oral 2 times daily 01/28/18 0928     01/29/18 0000  cefdinir (OMNICEF) 250 MG/5ML suspension     300 mg Oral 2 times daily 01/28/18 1106 02/02/18 2359   01/27/18 2000  ceFEPIme (MAXIPIME) 1 g in sodium chloride 0.9 % 100 mL IVPB  Status:  Discontinued     1 g 200 mL/hr over 30 Minutes Intravenous Every  12 hours 01/27/18 1146 01/28/18 0928   01/27/18 1000  vancomycin (VANCOCIN) IVPB 750 mg/150 ml premix  Status:  Discontinued     750 mg 150 mL/hr over 60 Minutes Intravenous Every 12 hours 01/26/18 1646 01/27/18 1013   01/27/18 0000  ceFEPIme (MAXIPIME) 1 g in sodium chloride 0.9 % 100 mL IVPB  Status:  Discontinued     1 g 200 mL/hr over 30 Minutes Intravenous Every 8 hours 01/26/18 1647 01/27/18  1013   01/26/18 1545  ceFEPIme (MAXIPIME) 1 g in sodium chloride 0.9 % 100 mL IVPB     1 g 200 mL/hr over 30 Minutes Intravenous  Once 01/26/18 1520 01/26/18 1616   01/26/18 1530  vancomycin (VANCOCIN) 1,250 mg in sodium chloride 0.9 % 250 mL IVPB     1,250 mg 166.7 mL/hr over 90 Minutes Intravenous  Once 01/26/18 1523 01/26/18 1746       Subjective:  patient had evidence of urinary retention overnight, now has a Foley in place Also complains of diarrhea, 4-5 times today  Objective: Vitals:   01/27/18 0929 01/27/18 1226 01/27/18 2215 01/28/18 0602  BP:  (!) 112/59 (!) 106/57 118/64  Pulse: 80 73 80 81  Resp:  18 18 (!) 22  Temp:  98.7 F (37.1 C) 99.5 F (37.5 C) 98.8 F (37.1 C)  TempSrc:  Oral Oral   SpO2: 100% 99% 97% 97%  Weight:      Height:        Intake/Output Summary (Last 24 hours) at 01/28/2018 1136 Last data filed at 01/28/2018 1100 Gross per 24 hour  Intake 1015 ml  Output -  Net 1015 ml   Filed Weights   01/26/18 1852  Weight: 65.4 kg (144 lb 2.9 oz)    Gen: Tired-appearing elderly female in evident pain Pulm: Non-labored breathing supplemental oxygen. Clear to auscultation anteriorly bilaterally.  CV: Regular rate and rhythm. No murmur, rub, or gallop. No JVD, no pedal edema. GI: Abdomen soft, non-tender, non-distended, with normoactive bowel sounds. No organomegaly or masses felt. Ext: Tenderness to palpation at right hip. Knee in immobilizer, distal cap refill brisk, sensation and strength normal.  Skin: Stage I coccyx pressure injury Neuro: Alert and oriented. No focal neurological deficits. Psych: Judgement and insight appear normal. Mood & affect appropriate.   Data Reviewed: I have personally reviewed following labs and imaging studies  CBC: Recent Labs  Lab 01/26/18 1736 01/27/18 0454  WBC 12.6* 13.4*  NEUTROABS 11.6*  --   HGB 8.7* 7.5*  HCT 26.6* 23.1*  MCV 91.4 91.7  PLT 311 818   Basic Metabolic Panel: Recent Labs  Lab  01/26/18 1736 01/27/18 0454  NA 134* 133*  K 4.2 3.9  CL 101 101  CO2 26 25  GLUCOSE 131* 132*  BUN 15 16  CREATININE 0.64 0.62  CALCIUM 9.3 8.8*   GFR: Estimated Creatinine Clearance: 42.3 mL/min (by C-G formula based on SCr of 0.62 mg/dL). Liver Function Tests: Recent Labs  Lab 01/26/18 1736  AST 30  ALT 22  ALKPHOS 385*  BILITOT 1.4*  PROT 5.8*  ALBUMIN 3.0*   No results for input(s): LIPASE, AMYLASE in the last 168 hours. No results for input(s): AMMONIA in the last 168 hours. Coagulation Profile: Recent Labs  Lab 01/26/18 1736  INR 1.17   Cardiac Enzymes: No results for input(s): CKTOTAL, CKMB, CKMBINDEX, TROPONINI in the last 168 hours. BNP (last 3 results) No results for input(s): PROBNP in the last 8760 hours. HbA1C: No  results for input(s): HGBA1C in the last 72 hours. CBG: No results for input(s): GLUCAP in the last 168 hours. Lipid Profile: No results for input(s): CHOL, HDL, LDLCALC, TRIG, CHOLHDL, LDLDIRECT in the last 72 hours. Thyroid Function Tests: No results for input(s): TSH, T4TOTAL, FREET4, T3FREE, THYROIDAB in the last 72 hours. Anemia Panel: No results for input(s): VITAMINB12, FOLATE, FERRITIN, TIBC, IRON, RETICCTPCT in the last 72 hours. Urine analysis:    Component Value Date/Time   COLORURINE AMBER (A) 01/26/2018 1736   APPEARANCEUR CLEAR 01/26/2018 1736   LABSPEC 1.020 01/26/2018 1736   PHURINE 5.0 01/26/2018 1736   GLUCOSEU NEGATIVE 01/26/2018 1736   HGBUR NEGATIVE 01/26/2018 1736   BILIRUBINUR NEGATIVE 01/26/2018 1736   KETONESUR 5 (A) 01/26/2018 1736   PROTEINUR 30 (A) 01/26/2018 1736   UROBILINOGEN >=8.0 06/25/2017 1636   NITRITE NEGATIVE 01/26/2018 1736   LEUKOCYTESUR NEGATIVE 01/26/2018 1736   Recent Results (from the past 240 hour(s))  Urine culture     Status: None   Collection Time: 01/26/18  5:36 PM  Result Value Ref Range Status   Specimen Description   Final    URINE, RANDOM Performed at [facility], [place] [address] [place], [place] [address]    Special Requests   Final    NONE Performed at [facility], [place] [address] [place], [place] [address]    Culture   Final    NO GROWTH Performed at [facility], [place] [address] [place], [place] [address]    Report Status 01/28/2018 FINAL  Final  Blood culture (routine x 2)     Status: None (Preliminary result)   Collection Time: 01/26/18  5:40 PM  Result Value Ref Range Status   Specimen Description   Final    BLOOD RIGHT FOREARM Performed at [place] [address], [place], [place] [address]    Special Requests   Final    BOTTLES DRAWN AEROBIC AND ANAEROBIC Blood Culture adequate volume Performed at [place] [address], [place], [place] [address]    Culture   Final    NO GROWTH < 24 HOURS Performed at [place] [address] [place], [place] [address]    Report Status PENDING  Incomplete  Blood culture (routine x 2)     Status: None (Preliminary result)   Collection Time: 01/26/18  8:04 PM  Result Value Ref Range Status   Specimen Description   Final    BLOOD LEFT ANTECUBITAL Performed at [place] [address] [place], [place] [address]    Special Requests   Final    BOTTLES DRAWN AEROBIC ONLY Blood Culture adequate volume   Culture   Final    NO GROWTH < 24 HOURS Performed at [facility], [place] [address] [place],  [address]    Report Status PENDING  Incomplete  MRSA PCR Screening     Status: None   Collection Time: 01/27/18  9:02 AM  Result Value Ref Range Status   MRSA by PCR NEGATIVE NEGATIVE Final    Comment:        The GeneXpert MRSA Assay (FDA approved for NASAL specimens only), is one component of a comprehensive MRSA colonization surveillance program. It is not intended to diagnose MRSA infection nor to guide or monitor treatment for MRSA infections. Performed at  [facility], [place] [address], [place],  [address]  Radiology Studies: Dg Chest 2 View  Result Date: 01/26/2018 CLINICAL DATA:  Recent fall with shortness of breath EXAM: CHEST - 2 VIEW COMPARISON:  01/08/2018 FINDINGS: Cardiac shadow is mildly enlarged but stable. The previously seen post radiation changes in the left lung are stable. Some increasing density in the left lung base is seen likely related to a new superimposed infiltrate. Large hiatal hernia is again identified. Previously seen nodular changes are not well appreciated. Multilevel compression deformities in the thoracic spine are again seen. Increase in the degree of vascular congestion is noted as well. IMPRESSION: Stable cardiomegaly. Stable hiatal hernia. Significant increase in left basilar infiltrate superimposed over chronic changes in the left mid lung. Vascular congestion is noted as well. Electronically Signed   By: Inez Catalina M.D.   On: 01/26/2018 15:16    Scheduled Meds: . [START ON 01/29/2018] cefdinir  300 mg Oral BID  . enoxaparin (LOVENOX) injection  40 mg Subcutaneous Q24H  . fentaNYL (SUBLIMAZE) injection  50 mcg Intravenous Once  . ipratropium-albuterol  3 mL Nebulization BID  . pantoprazole  40 mg Oral Daily  . polyethylene glycol  17 g Oral Daily   Continuous Infusions: . sodium chloride 75 mL/hr at 01/27/18 0700     LOS: 2 days   Time spent: 35 minutes.  Reyne Dumas, MD [facility] [email or web address] Password [facility] 01/28/2018, 11:36 AM

## 2018-01-28 NOTE — Progress Notes (Signed)
Bladder scan completed this am, 518 cc. Provider contacted to update. Will place foley cathter per provider order.

## 2018-01-28 NOTE — Progress Notes (Signed)
Previous RN reported patient has no urine output since yesterday. Bladder scanned showed 39ml at this time. Oncall MD informed and ordered to initiate in and out >430ml. Will continue to monitor the patient.

## 2018-01-28 NOTE — Progress Notes (Signed)
Patient has been disoriented to place. Primary RN witnessed pt attempting to get out of bed despite decreased mobility. Patients family have expressed concerns regarding 24/7 care. Pt's son and daughter have stated that they are unable to care for patient at home. Another son initially  discussed with CM that 24/7 care aide would be provided. Children at bedside are now stating that 24/7 care will not be available.  CM contacted.

## 2018-01-29 DIAGNOSIS — C250 Malignant neoplasm of head of pancreas: Secondary | ICD-10-CM

## 2018-01-29 DIAGNOSIS — S7291XA Unspecified fracture of right femur, initial encounter for closed fracture: Secondary | ICD-10-CM

## 2018-01-29 DIAGNOSIS — S72001A Fracture of unspecified part of neck of right femur, initial encounter for closed fracture: Secondary | ICD-10-CM

## 2018-01-29 DIAGNOSIS — W19XXXA Unspecified fall, initial encounter: Secondary | ICD-10-CM

## 2018-01-29 DIAGNOSIS — J189 Pneumonia, unspecified organism: Secondary | ICD-10-CM

## 2018-01-29 LAB — BASIC METABOLIC PANEL
ANION GAP: 7 (ref 5–15)
BUN: 13 mg/dL (ref 6–20)
CHLORIDE: 99 mmol/L — AB (ref 101–111)
CO2: 24 mmol/L (ref 22–32)
Calcium: 8.7 mg/dL — ABNORMAL LOW (ref 8.9–10.3)
Creatinine, Ser: 0.47 mg/dL (ref 0.44–1.00)
GFR calc Af Amer: >60 mL/min (ref >60)
GFR calc non Af Amer: >60 mL/min (ref >60)
Glucose, Bld: 89 mg/dL (ref 65–99)
POTASSIUM: 3.3 mmol/L — AB (ref 3.5–5.1)
Sodium: 130 mmol/L — ABNORMAL LOW (ref 135–145)

## 2018-01-29 MED ORDER — CEFDINIR 125 MG/5ML PO SUSR
300.0000 mg | Freq: Two times a day (BID) | ORAL | Status: DC
  Filled 2018-01-29: qty 12

## 2018-01-29 MED ORDER — SODIUM CHLORIDE 0.9 % IV SOLN
INTRAVENOUS | Status: DC
  Administered 2018-01-29: 13:00:00 via INTRAVENOUS

## 2018-01-29 MED ORDER — CEFDINIR 300 MG PO CAPS
300.0000 mg | ORAL_CAPSULE | Freq: Two times a day (BID) | ORAL | Status: DC
  Administered 2018-01-29: 300 mg via ORAL
  Filled 2018-01-29: qty 1

## 2018-01-29 NOTE — Progress Notes (Signed)
Patient has continued to be confused overnight. She is oriented to month and year but has insisted all night that she is at home and not in the hospital. Patient calling out for her daughter and son several times during the night. Nursing staff made several attempts to reorient patient. Spoke with patient's son, Clair Gulling, who appears very frustrated and concerned that they won't be able to take care of patient when she returns home. Will continue to monitor patient.

## 2018-01-29 NOTE — Progress Notes (Signed)
[facility] MSW note: Pt is current [facility] patient. Pt is a DNR. Met with pt, daughter and son. Pt does have a [facility] bed offer today. MSW educated pt and family on [facility]. Pt and family accepted [facility] for today. MSW completed [facility] Financial Agreement Form. Pt scheduled to go to [facility] between 2-3pm by non-emergency ems. MSW spoke with Elmyra Ricks, Social Worker to discuss case  Who agreed to arrange transport. MSW discussed case with Abby, staff RN. Thanks for your care with this pt.   Marilynne Halsted, MSW [phone]

## 2018-01-29 NOTE — Progress Notes (Signed)
[facility] PROGRESS NOTE    Progress Note  Carol Cook  JTT:[id number] DOB: [birth year] DOA: 01/26/2018 PCP: Velna Hatchet, MD     Brief Narrative:   Carol Cook is an 84 y.o. female past medical history of advanced pancreatic cancer followed by hospice comes to the ED after a fall at home resulting in right femoral fracture, options were discussed with family and family does not wish to pursue surgical intervention admitted for conservative management.  Assessment/Plan:   Femur fracture, right ([place]) secondary to fall at home: Family does not wish to proceed with surgical intervention. Continue leg in immobilizer. I do not think physical therapy is warranted as the patient is hospice and the family has decided not to proceed with surgical intervention. We will continue IV narcotics for pain.  Zofran for nausea as needed. Has been unsuccessful trying to contact family.  Metastatic advance pancreatic cancer not a candidate for chemotherapy: We will discuss with family hospice facility.  Hypoxia: She is now on Omnicef for concerns of possible infectious etiology. She was started on treatment several days ago for healthcare associated pneumonia with IV Vanco and cefepime  Acute urinary retention: Patient wishes not to have Foley likely due to opioids versus narcotics. Due to urinary retention and significant volume and bladder scan Foley was placed on 01/28/2018.  Diarrhea: She continues to have diarrhea, she is on Omnicef which has a high tendency of causing diarrhea. We will continue IV fluid hydration we will continue to monitor.    DVT prophylaxis: heparin Family Communication:none, unable to communicate with family Disposition Plan/Barrier to D/C: home 1-2 days Code Status:     Code Status Orders  (From admission, onward)        Start     Ordered   01/26/18 1624  Do not attempt resuscitation (DNR)  Continuous    Question Answer Comment  In the event of  cardiac or respiratory ARREST Do not call a "code blue"   In the event of cardiac or respiratory ARREST Do not perform Intubation, CPR, defibrillation or ACLS   In the event of cardiac or respiratory ARREST Use medication by any route, position, wound care, and other measures to relive pain and suffering. May use oxygen, suction and manual treatment of airway obstruction as needed for comfort.      01/26/18 1624    Code Status History    Date Active Date Inactive Code Status Order ID Comments User Context   01/08/2018 2133 01/10/2018 1811 DNR [id number]  Etta Quill, DO ED   06/25/2017 2349 06/28/2017 2022 Full Code [id number]  Etta Quill, DO ED    Advance Directive Documentation     Most Recent Value  Type of Advance Directive  Healthcare Power of Attorney, Living will  Pre-existing out of facility DNR order (yellow form or pink MOST form)  -  "MOST" Form in Place?  -        IV Access:    Peripheral IV   Procedures and diagnostic studies:   No results found.   Medical Consultants:    None.  Anti-Infectives:   Omnicef  Subjective:    Carol Cook she relates she continues to have diarrhea no new complaints.  Objective:    Vitals:   01/28/18 2112 01/28/18 2141 01/29/18 0321 01/29/18 0528  BP:  117/67  112/69  Pulse:  88 73 75  Resp:  18  18  Temp:  98.3 F (36.8 C)  99.3 F (37.4 C)  TempSrc:  Oral  Oral  SpO2: 93% 93% 96% 94%  Weight:      Height:        Intake/Output Summary (Last 24 hours) at 01/29/2018 0747 Last data filed at 01/29/2018 0700 Gross per 24 hour  Intake 1995 ml  Output 600 ml  Net 1395 ml   Filed Weights   01/26/18 1852  Weight: 65.4 kg (144 lb 2.9 oz)    Exam: General exam: In no acute distress. Respiratory system: Good air movement and clear to auscultation. Cardiovascular system: S1 & S2 heard, RRR.  Gastrointestinal system: Abdomen is nondistended, soft and nontender.  Central nervous system: Alert and oriented. No  focal neurological deficits. Extremities: No pedal edema. Skin: No rashes, lesions or ulcers Psychiatry: Judgement and insight appear normal. Mood & affect appropriate.    Data Reviewed:    Labs: Basic Metabolic Panel: Recent Labs  Lab 01/26/18 1736 01/27/18 0454 01/29/18 0540  NA 134* 133* 130*  K 4.2 3.9 3.3*  CL 101 101 99*  CO2 26 25 24   GLUCOSE 131* 132* 89  BUN 15 16 13   CREATININE 0.64 0.62 0.47  CALCIUM 9.3 8.8* 8.7*   GFR Estimated Creatinine Clearance: 42.3 mL/min (by C-G formula based on SCr of 0.47 mg/dL). Liver Function Tests: Recent Labs  Lab 01/26/18 1736  AST 30  ALT 22  ALKPHOS 385*  BILITOT 1.4*  PROT 5.8*  ALBUMIN 3.0*   No results for input(s): LIPASE, AMYLASE in the last 168 hours. No results for input(s): AMMONIA in the last 168 hours. Coagulation profile Recent Labs  Lab 01/26/18 1736  INR 1.17    CBC: Recent Labs  Lab 01/26/18 1736 01/27/18 0454  WBC 12.6* 13.4*  NEUTROABS 11.6*  --   HGB 8.7* 7.5*  HCT 26.6* 23.1*  MCV 91.4 91.7  PLT 311 230   Cardiac Enzymes: No results for input(s): CKTOTAL, CKMB, CKMBINDEX, TROPONINI in the last 168 hours. BNP (last 3 results) No results for input(s): PROBNP in the last 8760 hours. CBG: No results for input(s): GLUCAP in the last 168 hours. D-Dimer: No results for input(s): DDIMER in the last 72 hours. Hgb A1c: No results for input(s): HGBA1C in the last 72 hours. Lipid Profile: No results for input(s): CHOL, HDL, LDLCALC, TRIG, CHOLHDL, LDLDIRECT in the last 72 hours. Thyroid function studies: No results for input(s): TSH, T4TOTAL, T3FREE, THYROIDAB in the last 72 hours.  Invalid input(s): FREET3 Anemia work up: No results for input(s): VITAMINB12, FOLATE, FERRITIN, TIBC, IRON, RETICCTPCT in the last 72 hours. Sepsis Labs: Recent Labs  Lab 01/26/18 1736 01/27/18 0454  WBC 12.6* 13.4*   Microbiology Recent Results (from the past 240 hour(s))  Urine culture     Status:  None   Collection Time: 01/26/18  5:36 PM  Result Value Ref Range Status   Specimen Description   Final    URINE, RANDOM Performed at [facility], [place] [address], [place], [place] [address]    Special Requests   Final    NONE Performed at [facility], [place] [address], [place], [place] [address]    Culture   Final    NO GROWTH Performed at [facility], [place] [address], [place], [place] [address]    Report Status 01/28/2018 FINAL  Final  Blood culture (routine x 2)     Status: None (Preliminary result)   Collection Time: 01/26/18  5:40 PM  Result Value Ref Range Status   Specimen  Description   Final    BLOOD RIGHT FOREARM Performed at [place] [address], [place], [place] [address]    Special Requests   Final    BOTTLES DRAWN AEROBIC AND ANAEROBIC Blood Culture adequate volume Performed at [place] [address], [place], [place] [address]    Culture   Final    NO GROWTH 2 DAYS Performed at [place] [address] [place], [place] [address]    Report Status PENDING  Incomplete  Blood culture (routine x 2)     Status: None (Preliminary result)   Collection Time: 01/26/18  8:04 PM  Result Value Ref Range Status   Specimen Description   Final    BLOOD LEFT ANTECUBITAL Performed at [place] [address], [place], [place] [address]    Special Requests   Final    BOTTLES DRAWN AEROBIC ONLY Blood Culture adequate volume   Culture   Final    NO GROWTH 2 DAYS Performed at [facility], [place] [address], [place], [place] [address]    Report Status PENDING  Incomplete  MRSA PCR Screening     Status: None   Collection Time: 01/27/18  9:02 AM  Result Value Ref Range Status   MRSA by PCR NEGATIVE NEGATIVE Final    Comment:        The GeneXpert MRSA Assay (FDA approved for NASAL specimens only), is one component of a comprehensive MRSA  colonization surveillance program. It is not intended to diagnose MRSA infection nor to guide or monitor treatment for MRSA infections. Performed at [facility], [place] [address], [place], [place] [address]      Medications:   . cefdinir  300 mg Oral BID  . enoxaparin (LOVENOX) injection  40 mg Subcutaneous Q24H  . fentaNYL (SUBLIMAZE) injection  50 mcg Intravenous Once  . ipratropium-albuterol  3 mL Nebulization BID  . pantoprazole  40 mg Oral Daily  . polyethylene glycol  17 g Oral Daily   Continuous Infusions: . sodium chloride 75 mL/hr at 01/29/18 0210      LOS: 3 days   Charlynne Cousins  [facility] Pager [phone]  *Please refer to [email or web address], password [email or web address] to get updated schedule on who will round on this patient, as hospitalists switch teams weekly. If 7PM-7AM, please contact night-coverage at [email or web address], password [email or web address] for any overnight needs.  01/29/2018, 7:47 AM

## 2018-01-29 NOTE — Progress Notes (Addendum)
[facility] arranged for transport 2:00pm-3:00pm to [facility].  Discharge summary faxed to [phone]  Deirdre Pippins, MSW Clinical Social Worker  [phone] 01/29/2018  12:29 PM

## 2018-01-29 NOTE — Discharge Summary (Signed)
Physician Discharge Summary  Carol Cook FWY:[id number] DOB: Sep 15, 1926 DOA: 01/26/2018  PCP: Velna Hatchet, MD  Admit date: 01/26/2018 Discharge date: 01/29/2018  Admitted From: Home Disposition:  [facility]  Recommendations for Outpatient Follow-up:  1. None  Home Health:No Equipment/Devices:none  Discharge Condition: Hospice CODE STATUS:DNR Diet recommendation: Comfort feed  Brief/Interim Summary: 84 year old with past medical history of advanced pancreatic cancer not a candidate for chemotherapy followed by hospice comes in after fall at home which resulted in a right femoral fracture.  Discharge Diagnoses:  Principal Problem:   Femur fracture, right ([place]) Active Problems:   Primary cancer of head of pancreas ([place])   HCAP (healthcare-associated pneumonia)   Anemia   Pressure injury of skin  Right femoral fracture secondary to fall: After long discussion with family family decided not to proceed with surgical intervention. She was treated conservatively with Conex for pain which controlled her pain. The  decided to move to her [facility] with comfort care  Metastatic advanced pancreatic cancer: Not a candidate for chemotherapy on hospice.  Hypoxia: She was started empirically on antibiotic it is unlikely that she had infectious etiology as was determined on last admission this could probably be more scarring. As the patient is not a candidate for surgical intervention and the family decided to move towards comfort care they have opted to stop all antibiotics.  Acute urinary retention: Continue Foley.  Diarrhea likely due to antibiotics these were stopped. Continue loperamide. Discharge Instructions   Allergies as of 01/29/2018   No Known Allergies     Medication List    STOP taking these medications   cholecalciferol 1000 units tablet Commonly known as:  VITAMIN D   EYE DROPS OP   OPTIVITE PO   pantoprazole 40 MG tablet Commonly known as:  PROTONIX    polyethylene glycol powder powder Commonly known as:  MIRALAX   prochlorperazine 10 MG tablet Commonly known as:  COMPAZINE   vitamin C 500 MG tablet Commonly known as:  ASCORBIC ACID   vitamin E 400 UNIT capsule     TAKE these medications   escitalopram 10 MG tablet Commonly known as:  LEXAPRO TK 1 T PO D   HYDROmorphone 2 MG tablet Commonly known as:  DILAUDID Take 1 tablet (2 mg total) by mouth every 6 (six) hours as needed for severe pain. What changed:  how much to take   loperamide 2 MG capsule Commonly known as:  IMODIUM Take 2 mg by mouth daily as needed for diarrhea or loose stools.   mirtazapine 15 MG tablet Commonly known as:  REMERON TK 1 T PO HS FOR SLEEP AND APPETITE   ondansetron 8 MG tablet Commonly known as:  ZOFRAN TAKE 1 TABLET(8 MG) BY MOUTH EVERY 8 HOURS AS NEEDED FOR NAUSEA OR VOMITING   PROAIR HFA 108 (90 Base) MCG/ACT inhaler Generic drug:  albuterol INHALE 1 PUFF INTO LUNGS EVERY 4 HOURS AS NEEDED   temazepam 15 MG capsule Commonly known as:  RESTORIL Take 1 capsule (15 mg total) by mouth at bedtime as needed for sleep.      Follow-up Information    Velna Hatchet, MD. Call.   Specialty:  Internal Medicine Why:  hospital follow-up in 3-5 days Contact information: [place] [address] [phone]        [facility] Follow up.   Why:  home nurse visit Contact information: [place] [phone]  No Known Allergies  Consultations:  None   Procedures/Studies: Dg Chest 2 View  Result Date: 01/26/2018 CLINICAL DATA:  Recent fall with shortness of breath EXAM: CHEST - 2 VIEW COMPARISON:  01/08/2018 FINDINGS: Cardiac shadow is mildly enlarged but stable. The previously seen post radiation changes in the left lung are stable. Some increasing density in the left lung base is seen likely related to a new superimposed infiltrate.  Large hiatal hernia is again identified. Previously seen nodular changes are not well appreciated. Multilevel compression deformities in the thoracic spine are again seen. Increase in the degree of vascular congestion is noted as well. IMPRESSION: Stable cardiomegaly. Stable hiatal hernia. Significant increase in left basilar infiltrate superimposed over chronic changes in the left mid lung. Vascular congestion is noted as well. Electronically Signed   By: Inez Catalina M.D.   On: 01/26/2018 15:16   Dg Chest Port 1 View  Result Date: 01/08/2018 CLINICAL DATA:  Fever.  Pancreatic cancer.  History of lung cancer. EXAM: PORTABLE CHEST 1 VIEW COMPARISON:  CT 12/02/2017 FINDINGS: Stable density in the left mid lung, likely postradiation/treatment changes. Left lower lobe atelectasis or infiltrate. Suspect small left effusion. The innumerable previously seen small subcentimeter pulmonary nodules are again noted compatible with widespread metastases. Mild cardiomegaly. Degenerative changes in the shoulders. IMPRESSION: Numerous tiny scattered pulmonary nodules as seen on prior chest CT compatible with metastases. Postradiation changes in the left mid lung. Left lower lobe atelectasis or infiltrate with small left effusion. Electronically Signed   By: Rolm Baptise M.D.   On: 01/08/2018 18:06   Dg Knee Complete 4 Views Right  Result Date: 01/26/2018 CLINICAL DATA:  Right knee pain after fall. EXAM: RIGHT KNEE - COMPLETE 4+ VIEW COMPARISON:  None. FINDINGS: Moderately displaced fracture is seen involving the medial femoral condyle with extension to the supracondylar portion. Intra-articular extension is noted. Fat fluid level is noted in suprapatellar joint space. Moderate degenerative joint disease is noted laterally. IMPRESSION: Moderately displaced fracture is seen involving the medial femoral condyle with intra-articular extension. Electronically Signed   By: Marijo Conception, M.D.   On: 01/26/2018 10:21   Dg Hip  Unilat W Or Wo Pelvis 2-3 Views Right  Result Date: 01/26/2018 CLINICAL DATA:  Right hip pain after fall. EXAM: DG HIP (WITH OR WITHOUT PELVIS) 2-3V RIGHT COMPARISON:  None. FINDINGS: Status post right total hip arthroplasty. Vascular calcifications are noted. Probable minimally displaced periprosthetic fracture is noted in the proximal femur. IMPRESSION: Probable minimally displaced periprosthetic fracture is noted in proximal right fifth Electronically Signed   By: Marijo Conception, M.D.   On: 01/26/2018 10:26   Dg Femur Min 2 Views Right  Result Date: 01/26/2018 CLINICAL DATA:  Right femoral pain after fall. EXAM: RIGHT FEMUR 2 VIEWS COMPARISON:  None. FINDINGS: Status post right total hip arthroplasty. Minimally displaced periprosthetic fracture cannot be excluded. Moderately displaced fracture is seen involving the medial femoral condyle with intra-articular extension. Fat fluid level is noted in suprapatellar joint space. Vascular calcifications are noted. IMPRESSION: Moderately displaced medial femoral condyle fracture with intra-articular extension. Status post right total hip arthroplasty; minimally displaced periprosthetic fracture involving proximal femur cannot be excluded. Dedicated radiographs of the right hip are recommended. Electronically Signed   By: Marijo Conception, M.D.   On: 01/26/2018 10:25   US Abdomen Limited Ruq  Result Date: 01/09/2018 CLINICAL DATA:  Elevated liver function tests EXAM: ULTRASOUND ABDOMEN LIMITED RIGHT UPPER QUADRANT COMPARISON:  CT 12/02/2017 FINDINGS: Gallbladder: Surgically  absent Common bile duct: Diameter: Normal at 4 mm. Liver: Biliary duct dilatation is noted. Pneumobilia also noted. Findings similar to comparison CT. Portal vein is patent on color Doppler imaging with normal direction of blood flow towards the liver. IMPRESSION: 1. Intrahepatic biliary duct dilatation and pneumobilia are noted. Findings are similar to and potentially increased from CT  12/02/2017. 2. Common bile duct measures normal caliber. Electronically Signed   By: Suzy Bouchard M.D.   On: 01/09/2018 10:41      Subjective: No complaints.  Discharge Exam: Vitals:   01/29/18 0321 01/29/18 0528  BP:  112/69  Pulse: 73 75  Resp:  18  Temp:  99.3 F (37.4 C)  SpO2: 96% 94%   Vitals:   01/28/18 2112 01/28/18 2141 01/29/18 0321 01/29/18 0528  BP:  117/67  112/69  Pulse:  88 73 75  Resp:  18  18  Temp:  98.3 F (36.8 C)  99.3 F (37.4 C)  TempSrc:  Oral  Oral  SpO2: 93% 93% 96% 94%  Weight:      Height:        General: Pt is alert, awake, not in acute distress Cardiovascular: RRR, S1/S2 +, no rubs, no gallops Respiratory: CTA bilaterally, no wheezing, no rhonchi Abdominal: Soft, NT, ND, bowel sounds + Extremities: no edema, no cyanosis    The results of significant diagnostics from this hospitalization (including imaging, microbiology, ancillary and laboratory) are listed below for reference.     Microbiology: Recent Results (from the past 240 hour(s))  Urine culture     Status: None   Collection Time: 01/26/18  5:36 PM  Result Value Ref Range Status   Specimen Description   Final    URINE, RANDOM Performed at [facility] [address] [place], [place] [address]    Special Requests   Final    NONE Performed at [facility], [place] [address] [place], [place] [address]    Culture   Final    NO GROWTH Performed at [facility], [place] [address], [place], [place] [address]    Report Status 01/28/2018 FINAL  Final  Blood culture (routine x 2)     Status: None (Preliminary result)   Collection Time: 01/26/18  5:40 PM  Result Value Ref Range Status   Specimen Description   Final    BLOOD RIGHT FOREARM Performed at [facility] [address], [place], [place] [address]    Special Requests   Final    BOTTLES DRAWN AEROBIC AND ANAEROBIC Blood Culture adequate  volume Performed at [facility] [address], [place], [place] [address]    Culture   Final    NO GROWTH 2 DAYS Performed at [facility] [address], [place], [place] [address]    Report Status PENDING  Incomplete  Blood culture (routine x 2)     Status: None (Preliminary result)   Collection Time: 01/26/18  8:04 PM  Result Value Ref Range Status   Specimen Description   Final    BLOOD LEFT ANTECUBITAL Performed at [facility] [address] [place], [place] [address]    Special Requests   Final    BOTTLES DRAWN AEROBIC ONLY Blood Culture adequate volume   Culture   Final    NO GROWTH 2 DAYS Performed at [facility], [place] [address] [place],  [address]    Report Status PENDING  Incomplete  MRSA PCR Screening     Status: None   Collection Time: 01/27/18  9:02 AM  Result Value Ref Range Status   MRSA by PCR NEGATIVE NEGATIVE Final    Comment:        The GeneXpert MRSA Assay (FDA approved for NASAL specimens only), is one component of a comprehensive MRSA colonization surveillance program. It is not intended to diagnose MRSA infection nor to guide or monitor treatment for MRSA infections. Performed at [facility], [place] [address], [place], [place] [address]      Labs: BNP (last 3 results) No results for input(s): BNP in the last 8760 hours. Basic Metabolic Panel: Recent Labs  Lab 01/26/18 1736 01/27/18 0454 01/29/18 0540  NA 134* 133* 130*  K 4.2 3.9 3.3*  CL 101 101 99*  CO2 26 25 24   GLUCOSE 131* 132* 89  BUN 15 16 13   CREATININE 0.64 0.62 0.47  CALCIUM 9.3 8.8* 8.7*   Liver Function Tests: Recent Labs  Lab 01/26/18 1736  AST 30  ALT 22  ALKPHOS 385*  BILITOT 1.4*  PROT 5.8*  ALBUMIN 3.0*   No results for input(s): LIPASE, AMYLASE in the last 168 hours. No results for input(s): AMMONIA in the last 168 hours. CBC: Recent Labs  Lab 01/26/18 1736  01/27/18 0454  WBC 12.6* 13.4*  NEUTROABS 11.6*  --   HGB 8.7* 7.5*  HCT 26.6* 23.1*  MCV 91.4 91.7  PLT 311 230   Cardiac Enzymes: No results for input(s): CKTOTAL, CKMB, CKMBINDEX, TROPONINI in the last 168 hours. BNP: Invalid input(s): POCBNP CBG: No results for input(s): GLUCAP in the last 168 hours. D-Dimer No results for input(s): DDIMER in the last 72 hours. Hgb A1c No results for input(s): HGBA1C in the last 72 hours. Lipid Profile No results for input(s): CHOL, HDL, LDLCALC, TRIG, CHOLHDL, LDLDIRECT in the last 72 hours. Thyroid function studies No results for input(s): TSH, T4TOTAL, T3FREE, THYROIDAB in the last 72 hours.  Invalid input(s): FREET3 Anemia work up No results for input(s): VITAMINB12, FOLATE, FERRITIN, TIBC, IRON, RETICCTPCT in the last 72 hours. Urinalysis    Component Value Date/Time   COLORURINE AMBER (A) 01/26/2018 1736   APPEARANCEUR CLEAR 01/26/2018 1736   LABSPEC 1.020 01/26/2018 1736   PHURINE 5.0 01/26/2018 1736   GLUCOSEU NEGATIVE 01/26/2018 1736   HGBUR NEGATIVE 01/26/2018 1736   BILIRUBINUR NEGATIVE 01/26/2018 1736   KETONESUR 5 (A) 01/26/2018 1736   PROTEINUR 30 (A) 01/26/2018 1736   UROBILINOGEN >=8.0 06/25/2017 1636   NITRITE NEGATIVE 01/26/2018 1736   LEUKOCYTESUR NEGATIVE 01/26/2018 1736   Sepsis Labs Invalid input(s): PROCALCITONIN,  WBC,  LACTICIDVEN Microbiology Recent Results (from the past 240 hour(s))  Urine culture     Status: None   Collection Time: 01/26/18  5:36 PM  Result Value Ref Range Status   Specimen Description   Final    URINE, RANDOM Performed at [facility], [place] [address] [place], [place] [address]    Special Requests   Final    NONE Performed at [facility], [place] [address] [place], [place] [address]    Culture   Final    NO GROWTH Performed at [facility], [place] [address], [place], [place] [address]    Report Status 01/28/2018 FINAL  Final   Blood culture (routine x 2)     Status: None (Preliminary result)   Collection Time: 01/26/18  5:40 PM  Result Value Ref Range Status  Specimen Description   Final    BLOOD RIGHT FOREARM Performed at [facility] [address], [place], [place] [address]    Special Requests   Final    BOTTLES DRAWN AEROBIC AND ANAEROBIC Blood Culture adequate volume Performed at [facility] [address] [place], [place] [address]    Culture   Final    NO GROWTH 2 DAYS Performed at [facility] [address] [place], [place] [address]    Report Status PENDING  Incomplete  Blood culture (routine x 2)     Status: None (Preliminary result)   Collection Time: 01/26/18  8:04 PM  Result Value Ref Range Status   Specimen Description   Final    BLOOD LEFT ANTECUBITAL Performed at [facility] [address], [place], [place] [address]    Special Requests   Final    BOTTLES DRAWN AEROBIC ONLY Blood Culture adequate volume   Culture   Final    NO GROWTH 2 DAYS Performed at [facility], [place] [address] [place], [place] [address]    Report Status PENDING  Incomplete  MRSA PCR Screening     Status: None   Collection Time: 01/27/18  9:02 AM  Result Value Ref Range Status   MRSA by PCR NEGATIVE NEGATIVE Final    Comment:        The GeneXpert MRSA Assay (FDA approved for NASAL specimens only), is one component of a comprehensive MRSA colonization surveillance program. It is not intended to diagnose MRSA infection nor to guide or monitor treatment for MRSA infections. Performed at [facility], [place] [address], [place], [place] [address]      Time coordinating discharge: Over 30 minutes  SIGNED:   Charlynne Cousins, MD  [facility] 01/29/2018, 12:45 PM Pager   If 7PM-7AM, please contact night-coverage [email or web address] Password [email or web address]

## 2018-01-29 NOTE — Progress Notes (Signed)
Report called to [facility]. Pt is ready for discharge. Discharge instructions will be sent over with transport. Social work set up for transport between 2-3

## 2018-01-29 NOTE — Progress Notes (Signed)
Pt discharged from the unit via ems stretcher. Family sent with pt belongings. No questions or concerns at this time.

## 2018-01-31 ENCOUNTER — Other Ambulatory Visit: Payer: Self-pay | Admitting: Oncology

## 2018-01-31 DIAGNOSIS — C250 Malignant neoplasm of head of pancreas: Secondary | ICD-10-CM

## 2018-01-31 LAB — CULTURE, BLOOD (ROUTINE X 2)
Culture: NO GROWTH
Culture: NO GROWTH
SPECIAL REQUESTS: ADEQUATE
Special Requests: ADEQUATE

## 2018-02-21 DEATH — deceased

## 2018-04-15 ENCOUNTER — Other Ambulatory Visit

## 2018-07-16 IMAGING — US US ABDOMEN LIMITED
1 series · 14 of 25 positions shown · non-contrast
Comparison: CT 12/02/2017

CLINICAL DATA: Elevated liver function tests

EXAM:
ULTRASOUND ABDOMEN LIMITED RIGHT UPPER QUADRANT

[Series 1: us abdomen limited · 14 of 29 slices shown]
[im 1/29]
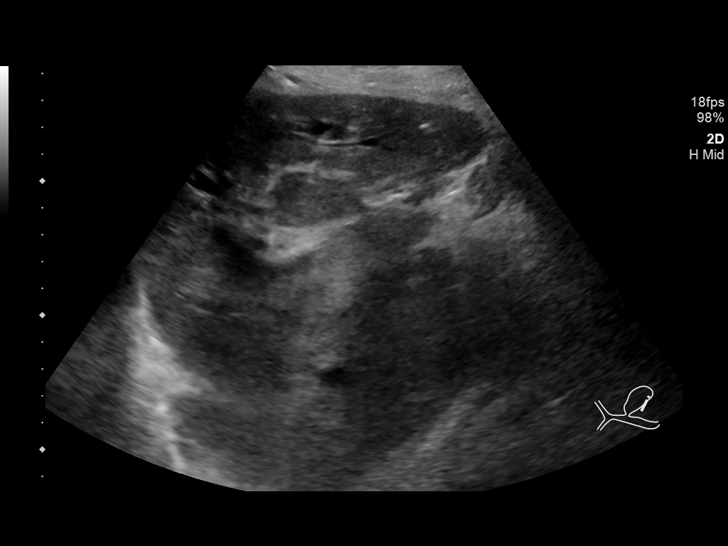
[im 3/29]
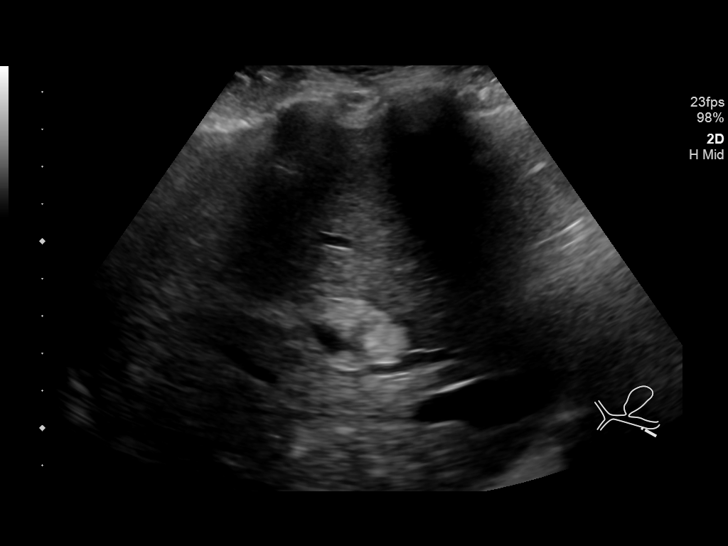
[im 5/29]
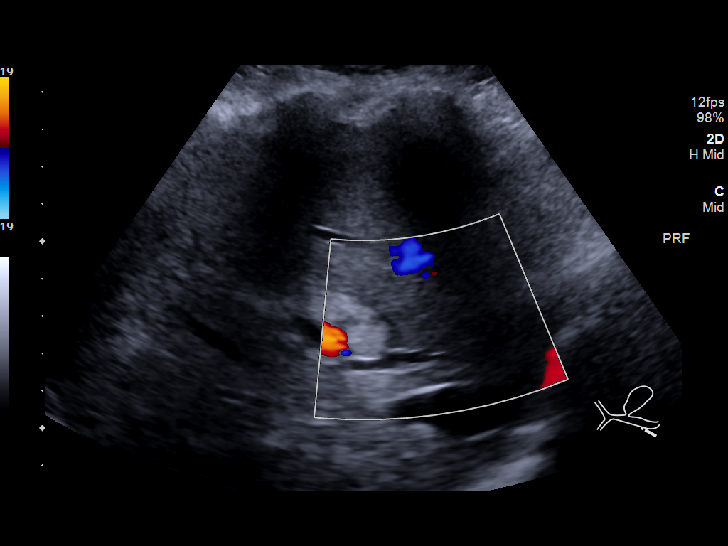
[im 8/29]
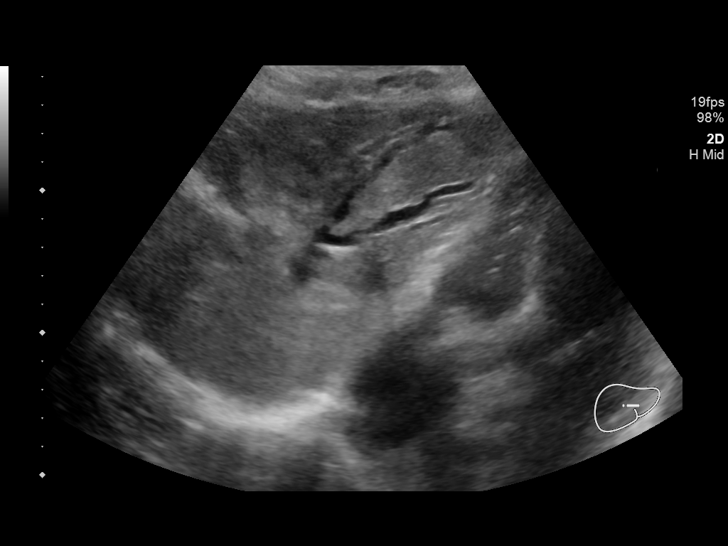
[im 10/29]
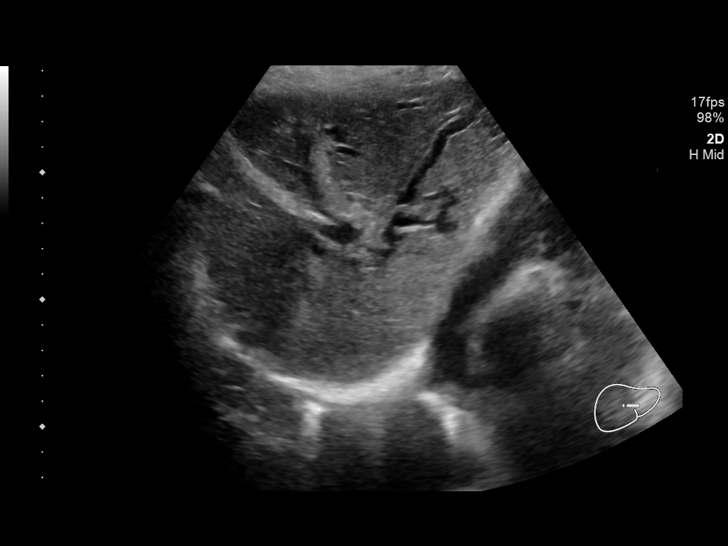
[im 11/29]
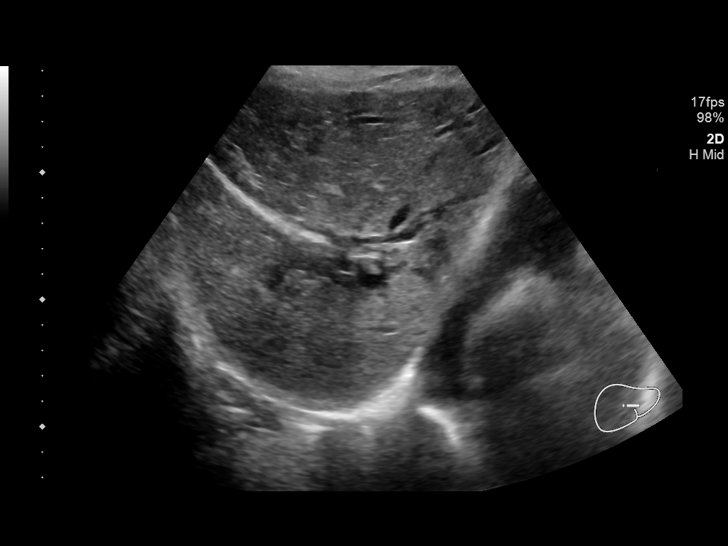
[im 13/29]
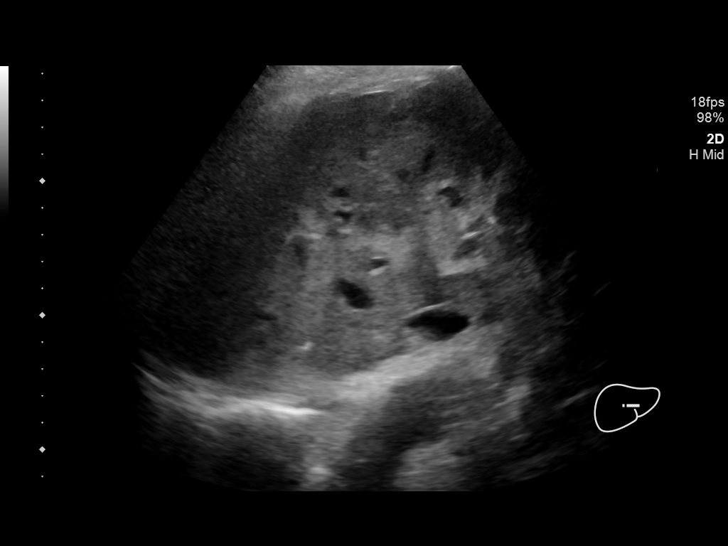
[im 16/29]
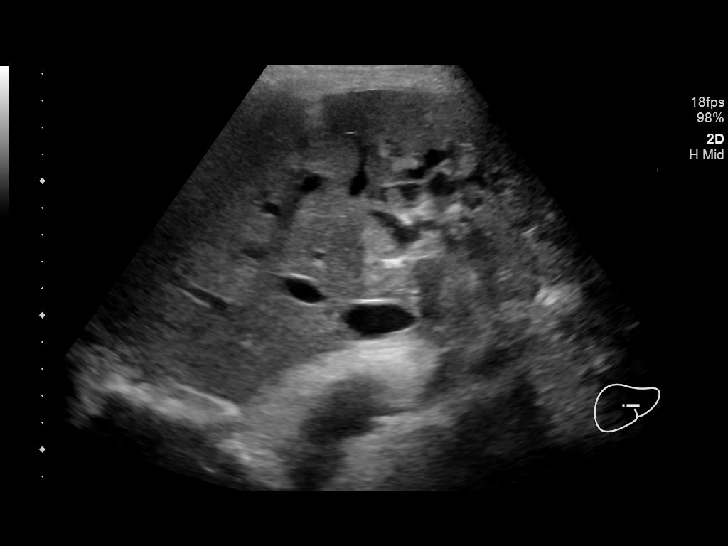
[im 18/29]
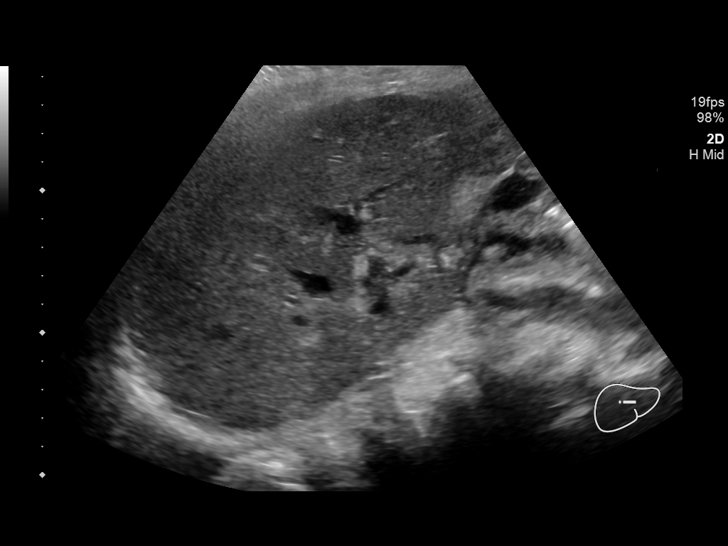
[im 19/29]
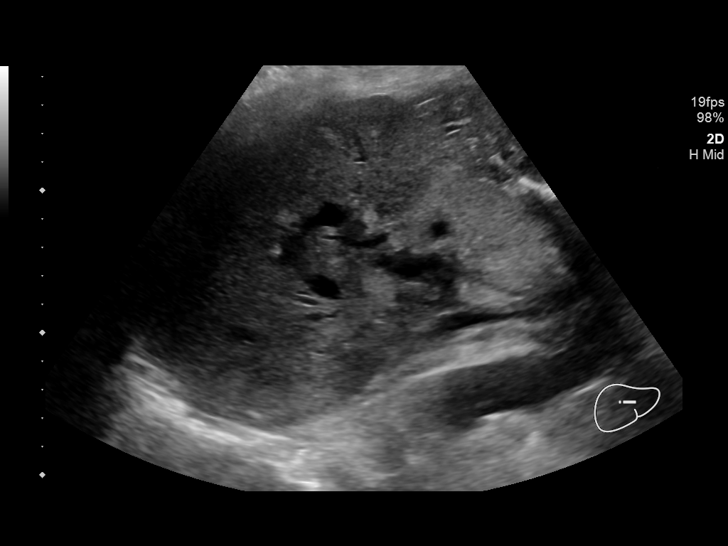
[im 22/29]
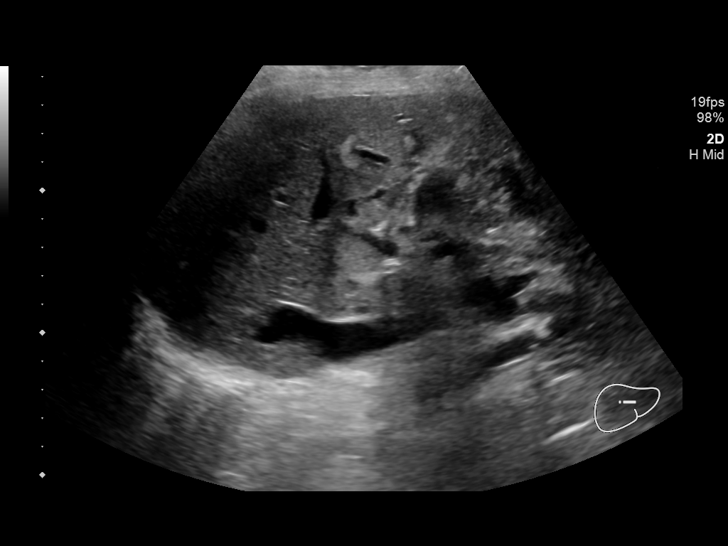
[im 24/29]
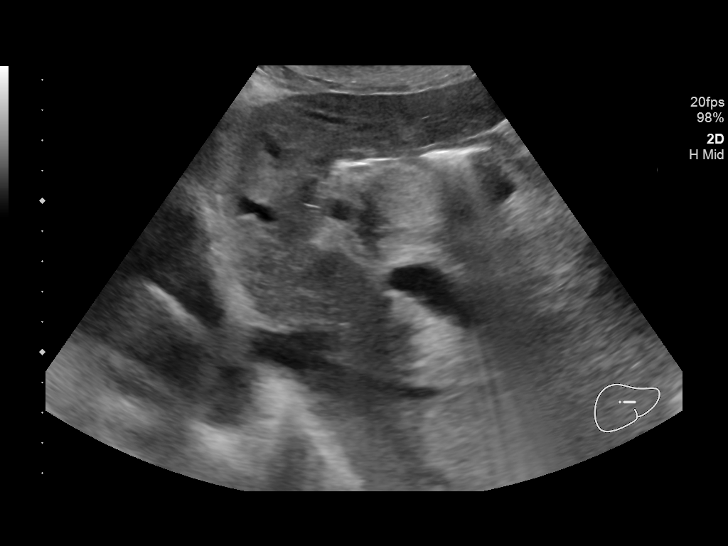
[im 26/29]
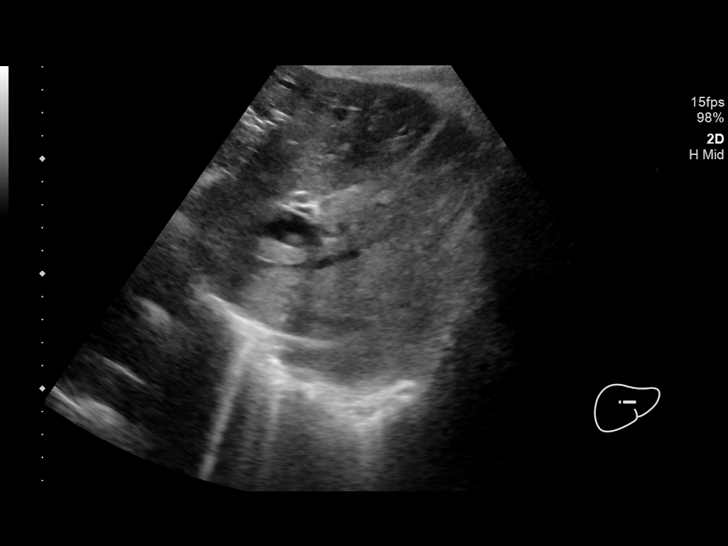
[im 29/29]
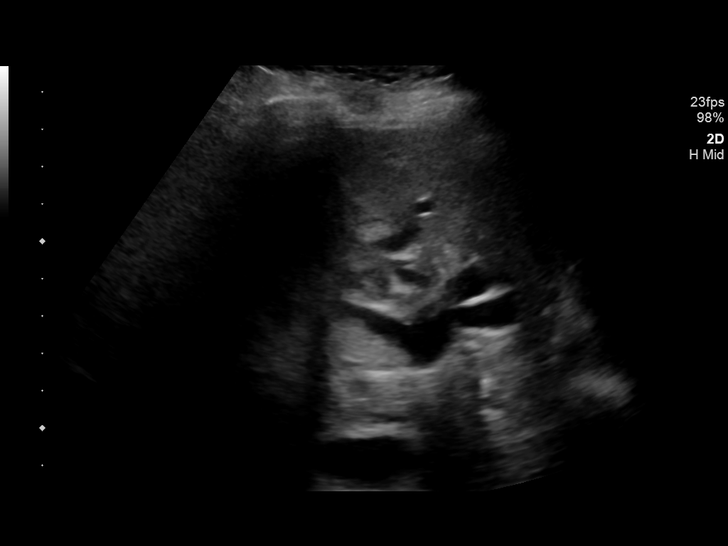

[14 of 25 positions shown; findings below may reference images not displayed]

FINDINGS: Gallbladder:

Surgically absent

Common bile duct:

Diameter: Normal at 4 mm.

Liver:

Biliary duct dilatation is noted. Pneumobilia also noted. Findings
similar to comparison CT. Portal vein is patent on color Doppler
imaging with normal direction of blood flow towards the liver.
IMPRESSION: 1. Intrahepatic biliary duct dilatation and pneumobilia are noted.
Findings are similar to and potentially increased from CT
12/02/2017.
[DATE]. Common bile duct measures normal caliber.

## 2018-08-02 IMAGING — CR DG HIP (WITH OR WITHOUT PELVIS) 2-3V*R*
3 series · 3 of 3 positions shown · non-contrast
Comparison: None.

CLINICAL DATA: Right hip pain after fall.

EXAM:
DG HIP (WITH OR WITHOUT PELVIS) 2-3V RIGHT

[w hip lat right]
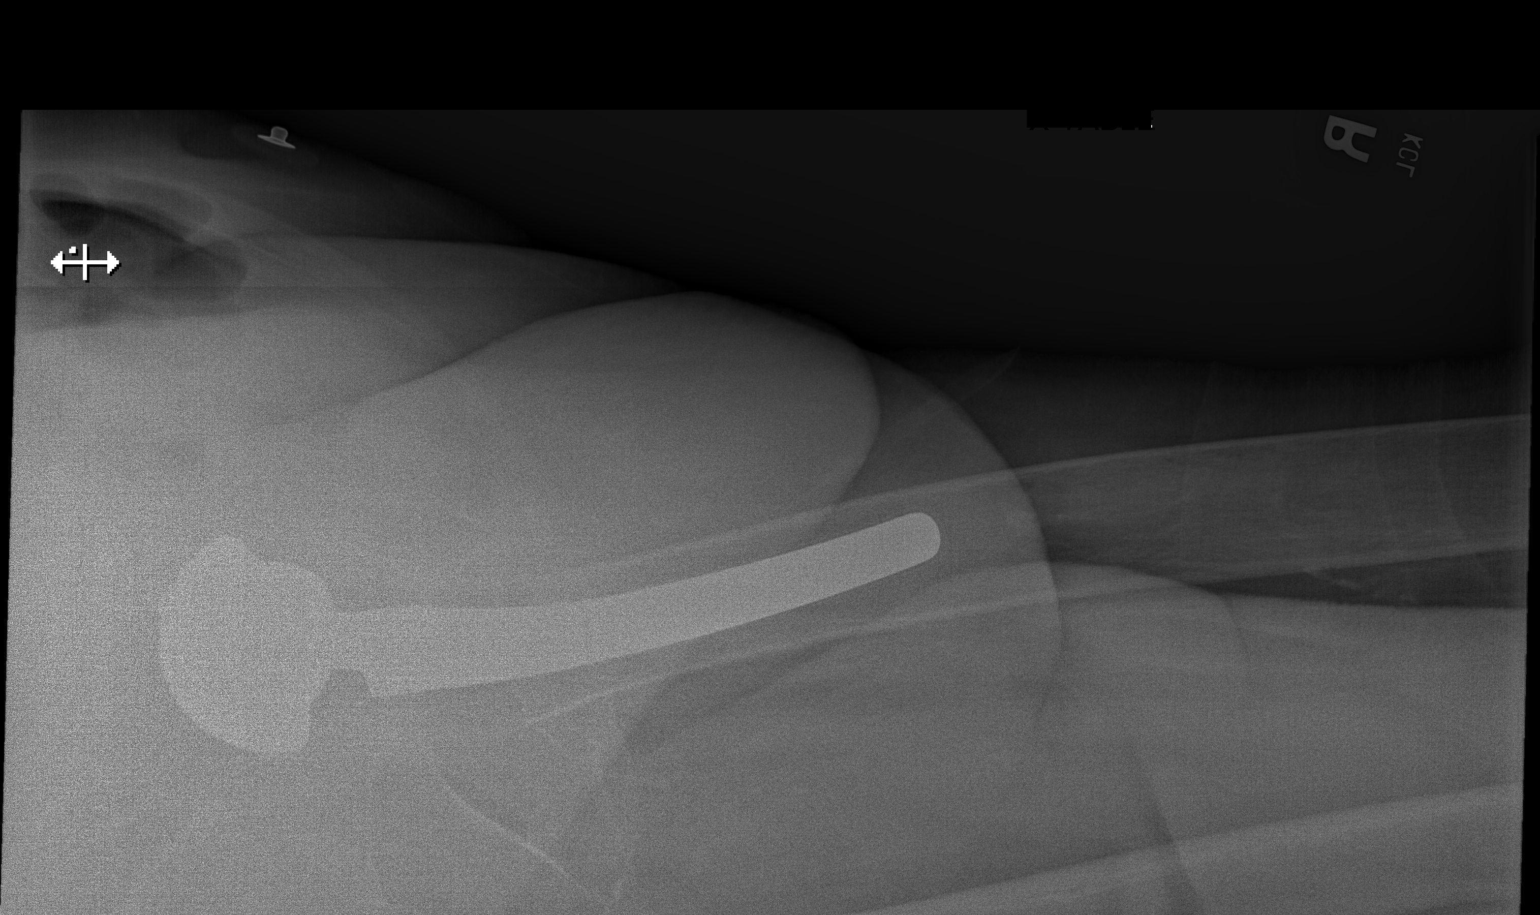

[t hip ap right]
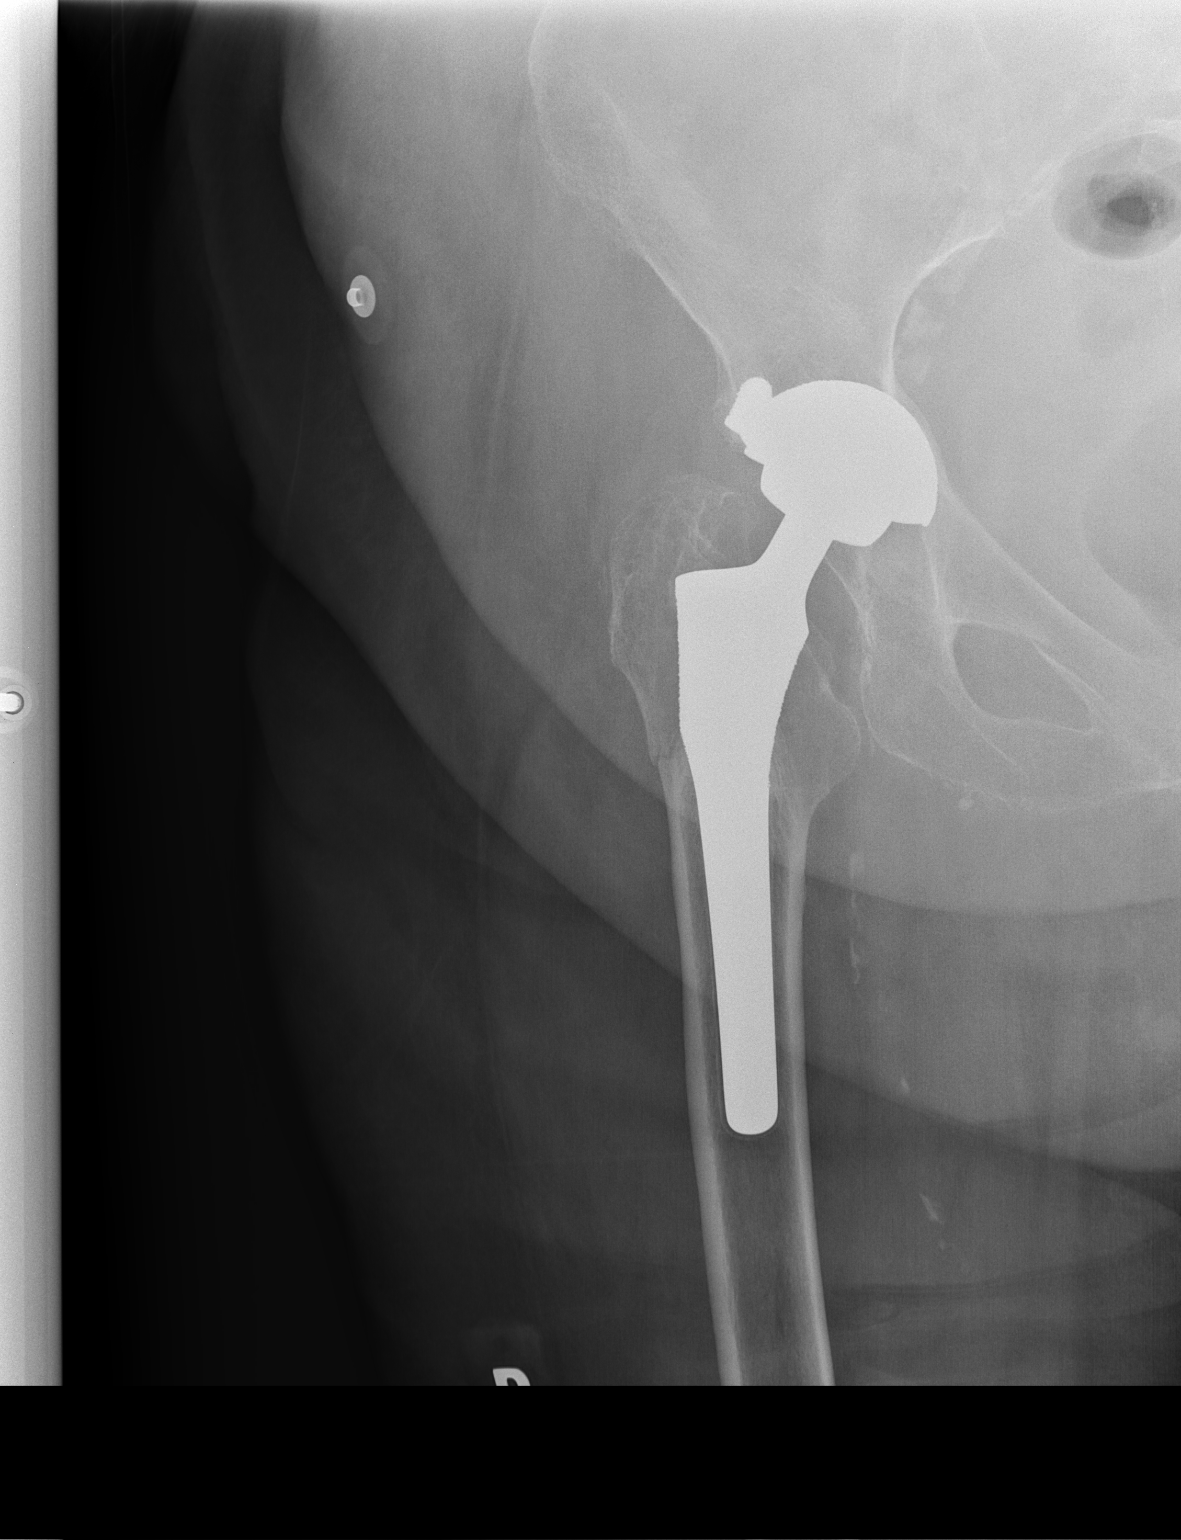

[t pelvis ap]
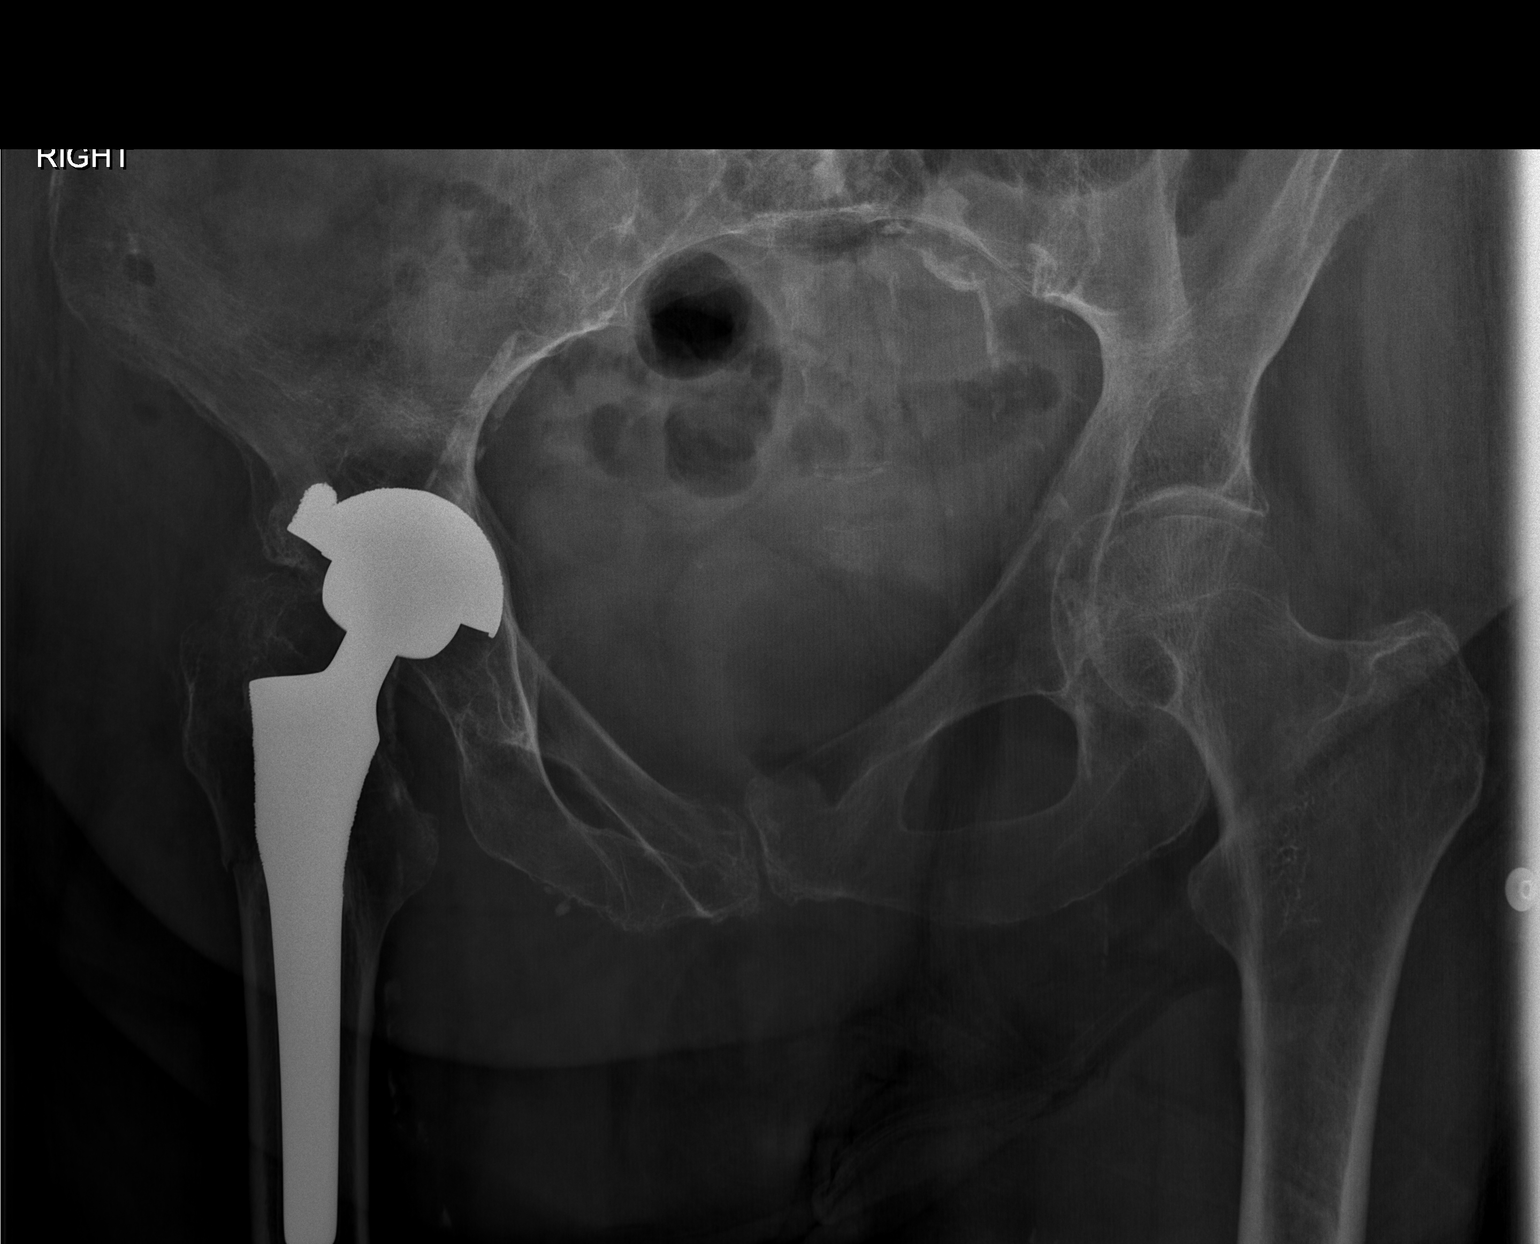

[3 of 3 positions shown; findings below may reference images not displayed]

FINDINGS: Status post right total hip arthroplasty. Vascular calcifications
are noted. Probable minimally displaced periprosthetic fracture is
noted in the proximal femur.
IMPRESSION: Probable minimally displaced periprosthetic fracture is noted in
proximal right fifth

## 2018-08-02 IMAGING — CR DG FEMUR 2+V*R*
4 series · 4 of 4 positions shown · non-contrast
Comparison: None.

CLINICAL DATA: Right femoral pain after fall.

EXAM:
RIGHT FEMUR 2 VIEWS

[t femur proximal lat right]
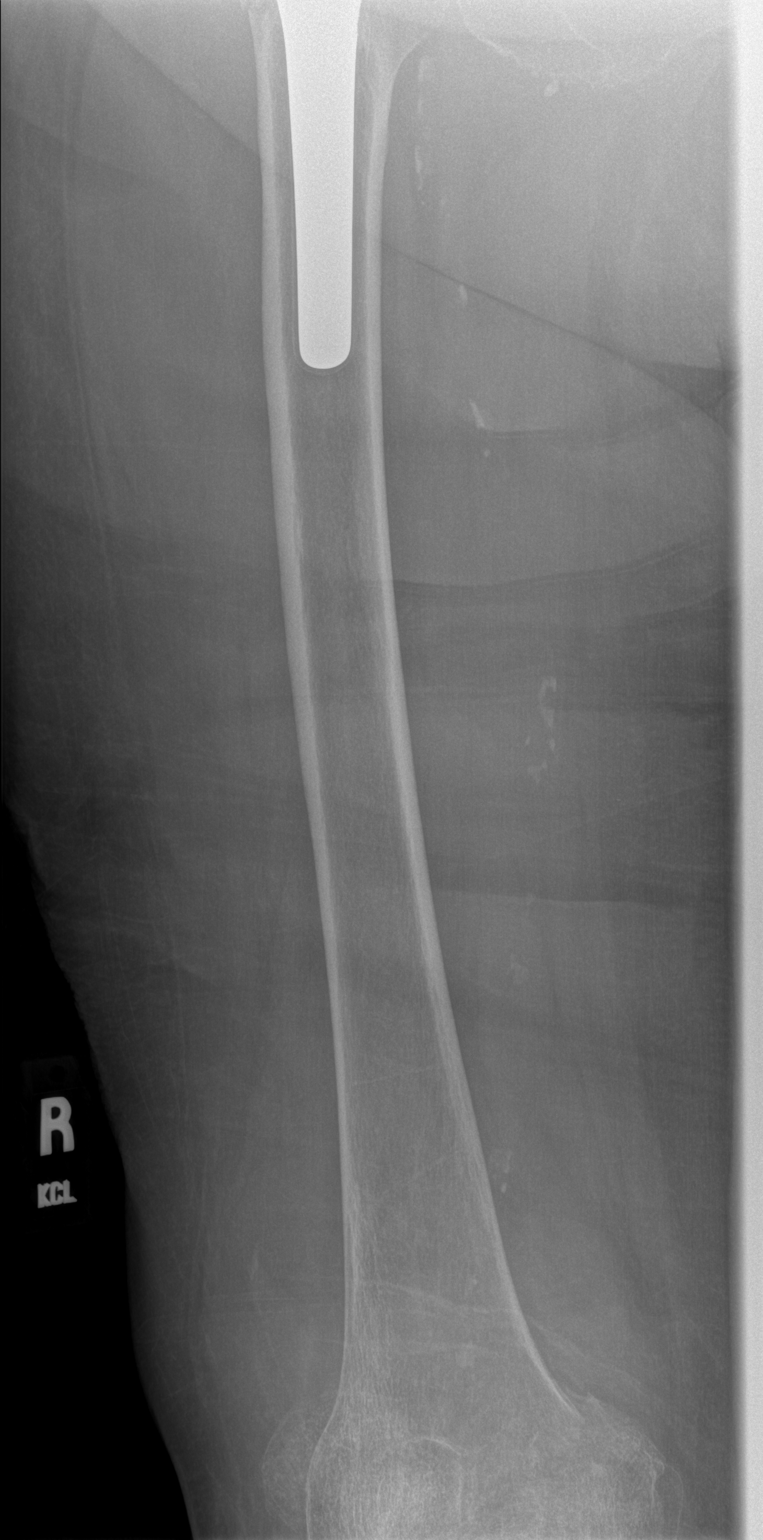

[t femur distal ap right]
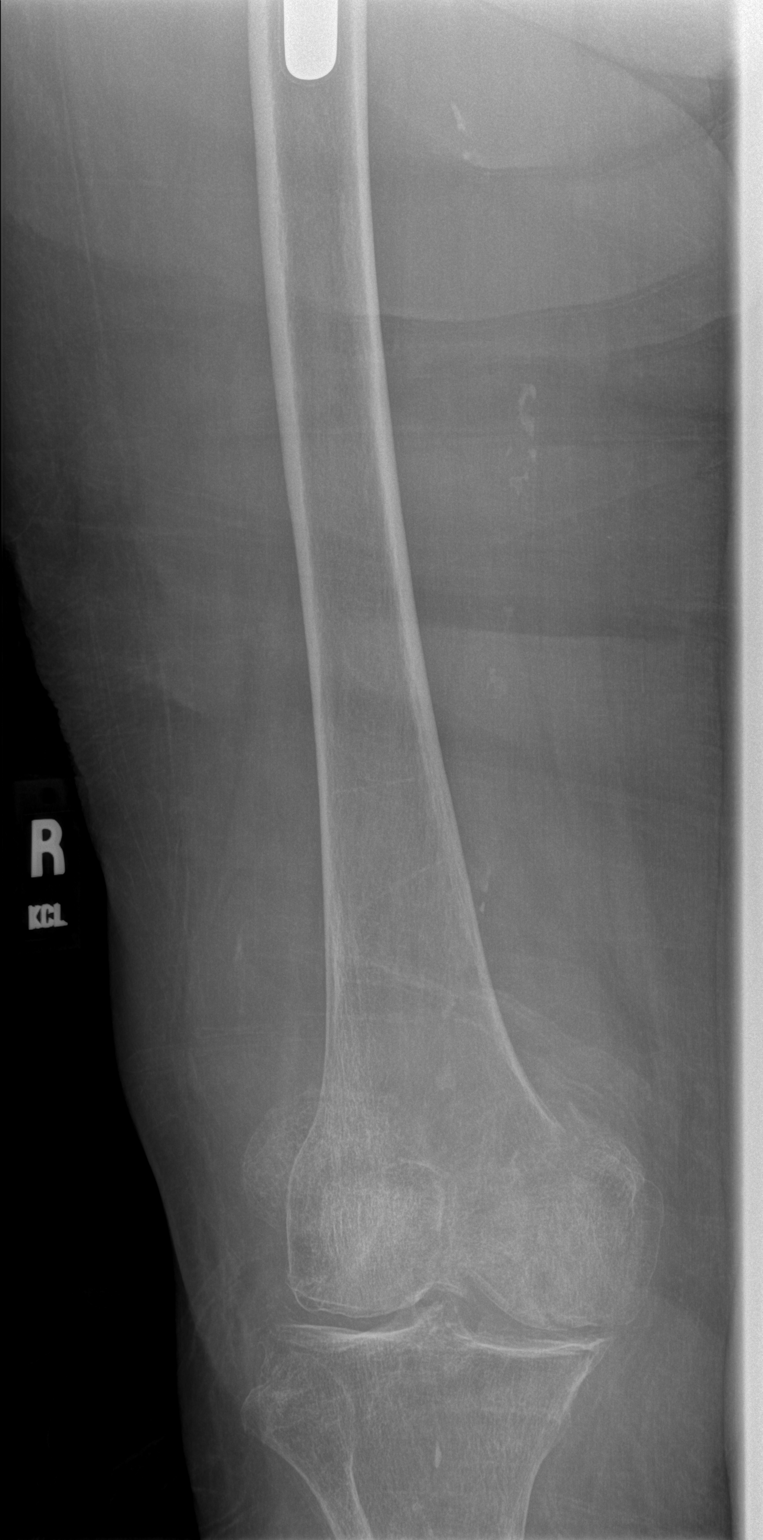

[t femur proximal ap right]
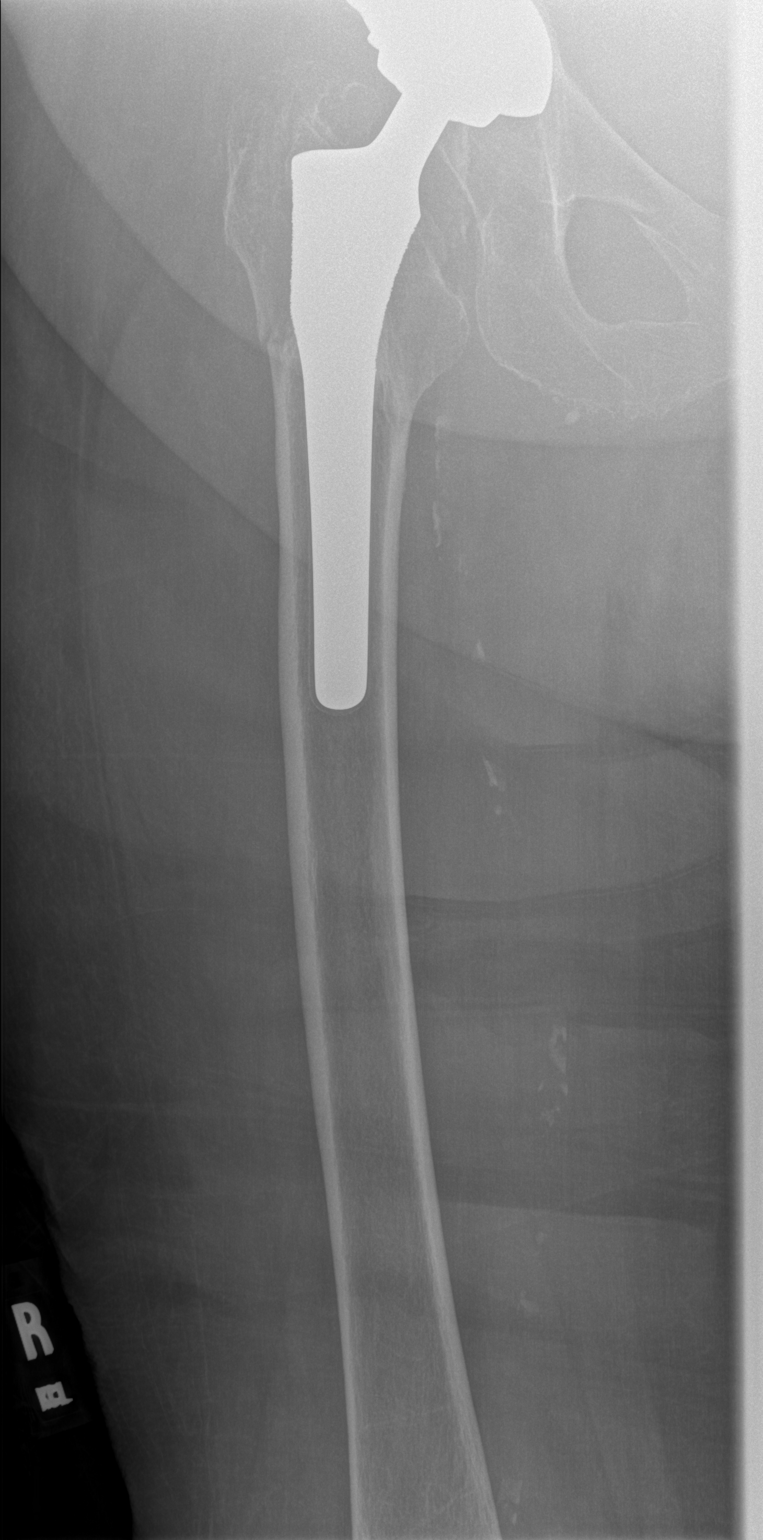

[x femur distal ap right]
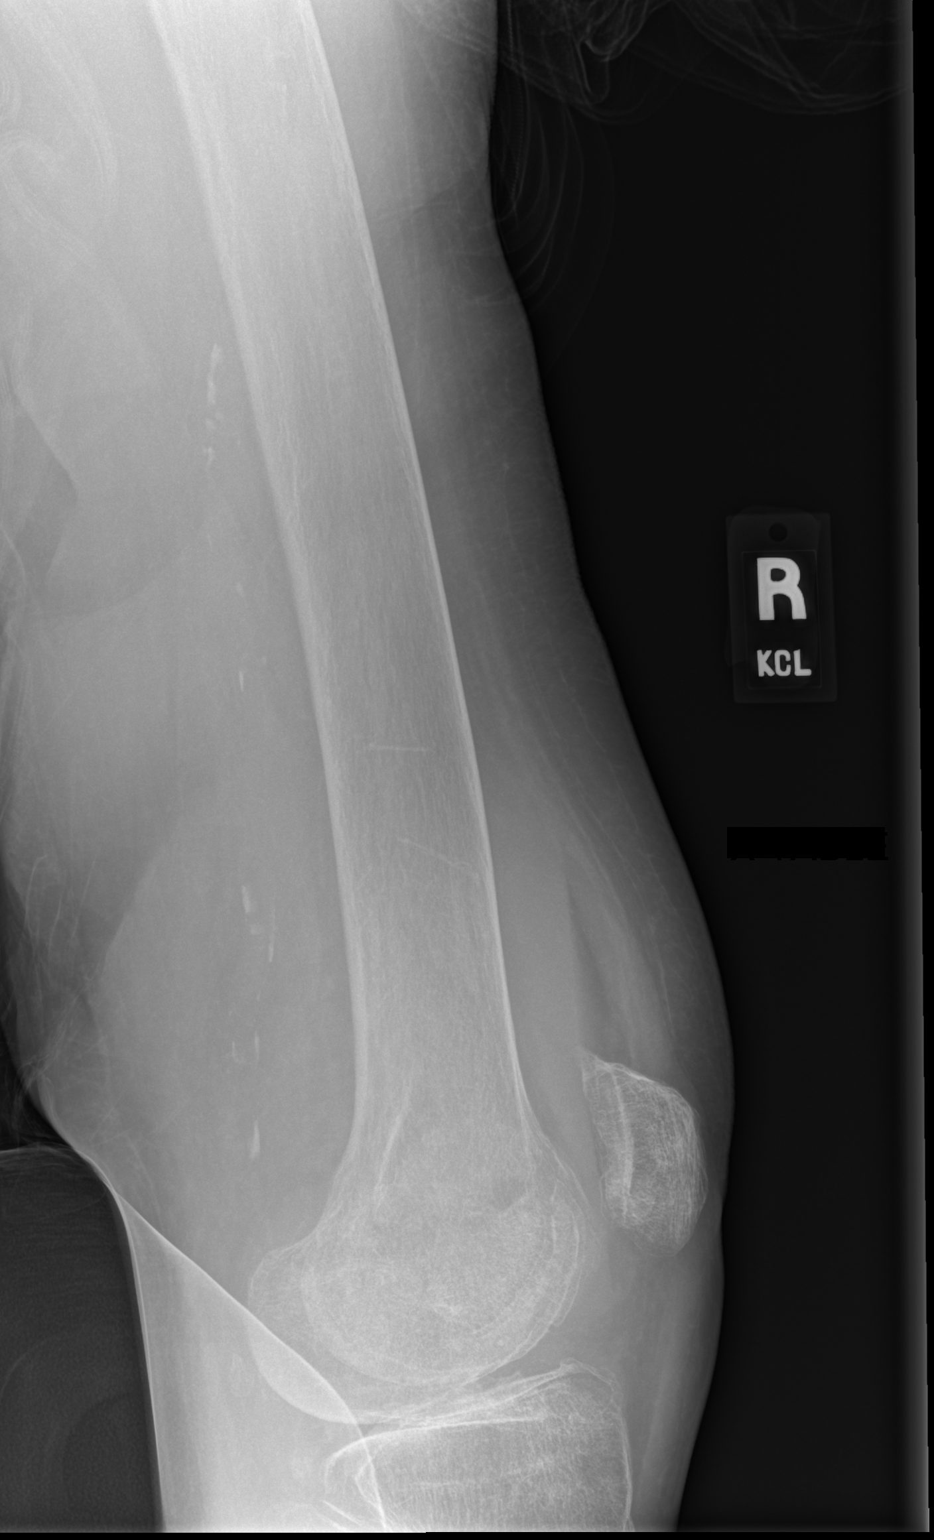

[4 of 4 positions shown; findings below may reference images not displayed]

FINDINGS: Status post right total hip arthroplasty. Minimally displaced
periprosthetic fracture cannot be excluded. Moderately displaced
fracture is seen involving the medial femoral condyle with
intra-articular extension. Fat fluid level is noted in suprapatellar
joint space. Vascular calcifications are noted.
IMPRESSION: Moderately displaced medial femoral condyle fracture with
intra-articular extension. Status post right total hip arthroplasty;
minimally displaced periprosthetic fracture involving proximal femur
cannot be excluded. Dedicated radiographs of the right hip are
recommended.
